# Patient Record
Sex: Male | Born: 1940 | Marital: Married | Smoking: Former smoker
Health system: Southern US, Community
[De-identification: ages and names within clinical notes are randomized; demographics above are authoritative.]

## PROBLEM LIST (undated history)

## (undated) DIAGNOSIS — I10 Essential (primary) hypertension: Secondary | ICD-10-CM

## (undated) DIAGNOSIS — C801 Malignant (primary) neoplasm, unspecified: Secondary | ICD-10-CM

## (undated) DIAGNOSIS — D496 Neoplasm of unspecified behavior of brain: Secondary | ICD-10-CM

## (undated) DIAGNOSIS — E785 Hyperlipidemia, unspecified: Secondary | ICD-10-CM

## (undated) HISTORY — DX: Malignant (primary) neoplasm, unspecified: C80.1

## (undated) HISTORY — DX: Essential (primary) hypertension: I10

## (undated) HISTORY — PX: BRAIN SURGERY: SHX531

## (undated) HISTORY — DX: Hyperlipidemia, unspecified: E78.5

## (undated) HISTORY — PX: APPENDECTOMY: SHX54

## (undated) HISTORY — DX: Neoplasm of unspecified behavior of brain: D49.6

---

## 2007-07-29 ENCOUNTER — Encounter: Payer: Self-pay | Admitting: Emergency Medicine

## 2007-07-29 ENCOUNTER — Inpatient Hospital Stay (HOSPITAL_COMMUNITY): Admission: EM | Admit: 2007-07-29 | Discharge: 2007-08-04 | Payer: Self-pay | Admitting: Emergency Medicine
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
## 2011-01-10 ENCOUNTER — Encounter: Payer: Self-pay | Admitting: Family Medicine

## 2011-01-10 DIAGNOSIS — I10 Essential (primary) hypertension: Secondary | ICD-10-CM | POA: Insufficient documentation

## 2011-03-20 LAB — BASIC METABOLIC PANEL
CO2: 25
CO2: 27
Chloride: 103
Chloride: 107
Chloride: 111
Creatinine, Ser: 0.93
Creatinine, Ser: 1.11
Creatinine, Ser: 1.28
GFR calc Af Amer: >60
GFR calc Af Amer: >60
GFR calc Af Amer: >60
Glucose, Bld: 126 — ABNORMAL HIGH
Potassium: 4.1
Sodium: 141

## 2011-03-20 LAB — DIFFERENTIAL
Basophils Absolute: 0
Basophils Relative: 0
Eosinophils Absolute: 0.1
Monocytes Absolute: 0.7
Monocytes Relative: 7
Neutrophils Relative %: 65

## 2011-03-20 LAB — CBC
Hemoglobin: 14.1
Hemoglobin: 9.3 — ABNORMAL LOW
MCHC: 34.7
MCHC: 35.3
MCV: 91.5
MCV: 91.9
MCV: 93.3
RBC: 2.87 — ABNORMAL LOW
RBC: 3.8 — ABNORMAL LOW
RBC: 4.35
RDW: 14
WBC: 13.4 — ABNORMAL HIGH

## 2011-03-20 LAB — HEPATIC FUNCTION PANEL
Bilirubin, Direct: 0.1
Indirect Bilirubin: 0.8
Total Bilirubin: 0.9

## 2011-03-20 LAB — URINALYSIS, ROUTINE W REFLEX MICROSCOPIC
Leukocytes, UA: NEGATIVE
Nitrite: NEGATIVE
Urobilinogen, UA: 0.2

## 2011-03-20 LAB — TYPE AND SCREEN

## 2011-03-20 LAB — URINE MICROSCOPIC-ADD ON

## 2011-03-21 LAB — CBC
MCHC: 35.1
MCV: 92.9
Platelets: 148 — ABNORMAL LOW
RDW: 14.1
WBC: 10.2

## 2012-10-12 ENCOUNTER — Telehealth: Payer: Self-pay | Admitting: Family Medicine

## 2012-10-12 NOTE — Telephone Encounter (Signed)
Per [facility] can try Bystolic.Marland Kitchengive samples.... Have samples of Bystolic 20mg  and pt can cut in half. Pt to take med for a fews weeks and see if rash goes away and if not will NTBS. Pt aware and agrees and samples left at front desk for pick up!

## 2012-11-05 ENCOUNTER — Ambulatory Visit (INDEPENDENT_AMBULATORY_CARE_PROVIDER_SITE_OTHER): Payer: Medicare Other | Admitting: Family Medicine

## 2012-11-05 ENCOUNTER — Encounter: Payer: Self-pay | Admitting: Family Medicine

## 2012-11-05 VITALS — BP 140/88 | HR 50 | Temp 98.3°F | Resp 18 | Wt <= 1120 oz

## 2012-11-05 DIAGNOSIS — B372 Candidiasis of skin and nail: Secondary | ICD-10-CM | POA: Insufficient documentation

## 2012-11-05 MED ORDER — MOMETASONE FUROATE 0.1 % EX OINT: TOPICAL_OINTMENT | Freq: Every day | CUTANEOUS | Status: DC

## 2012-11-05 MED ORDER — TERBINAFINE HCL 250 MG PO TABS: 250.0000 mg | ORAL_TABLET | Freq: Every day | ORAL | Status: DC

## 2012-11-05 NOTE — Progress Notes (Signed)
  Subjective:    Patient ID: [name], male    DOB: Sep 06, 1940, 72 y.o.   MRN: [id number]  HPI  Patient is here today for followup of his intertrigo. He is seeing dermatology recommended similar therapies to we tried. However the rash continues to return frequently. He is dissatisfied with his current medical regimen. Ketoconazole 2% cream is not controlled the rash. The rash will improve on Diflucan. However usually within a month of completing the Diflucan the rash returns. He has tried to change his clothing 2 weeks left of that area but this does not seem to be helping.  As his blood pressure he is currently on Bystolic 20 mg by mouth daily. His blood pressure is borderline. However he refuses to continue to take this because of cost.  Of note he previously quit his Meryl Crutch because he thought it was causing the rash. Past Medical History  Diagnosis Date  . Brain tumor   . Hypertension    No current outpatient prescriptions on file prior to visit.   No current facility-administered medications on file prior to visit.   Allergies  Allergen Reactions  . Amlodipine     Leg swelling     Review of Systems    review of systems is otherwise negative Objective:   Physical Exam  Constitutional: He appears well-developed and well-nourished.  Cardiovascular: Normal rate, regular rhythm, normal heart sounds and intact distal pulses.  Exam reveals no gallop and no friction rub.   No murmur heard. Pulmonary/Chest: Effort normal and breath sounds normal. No respiratory distress. He has no wheezes. He has no rales.  Abdominal: Soft. Bowel sounds are normal. He exhibits no distension.  Skin: Rash noted. There is erythema.    Bright red intertriginous rash in the folds between his thighs and scrotum on both sides.  There is no scale or evidence of cellulitis. The rash is well-circumscribed with a serpiginous border the      Assessment & Plan:  1. Candidal intertrigo For one month we  will prescribe Lamisil 250 mg by mouth daily to suppress this rash. He is also to use Elocon 0.1% ointment applied sparingly once a day to calm down the itching. In one month if his rash is completely gone and improved, he would then switch to ketoconazole 2%every day and use the Elocon sparingly every 2-3 days for itching. Then in the third month if rash continues to remain controlled he will continue ketoconazole alone as a suppressant. #2 hypertension. Discontinue Bystolic. Resume Losartan 100 mg by mouth daily. - terbinafine (LAMISIL) 250 MG tablet; Take 1 tablet (250 mg total) by mouth daily.  Dispense: 30 tablet; Refill: 0 - mometasone (ELOCON) 0.1 % ointment; Apply topically daily.  Dispense: 45 g; Refill: 1

## 2013-03-16 ENCOUNTER — Other Ambulatory Visit: Payer: Self-pay | Admitting: Family Medicine

## 2013-07-05 ENCOUNTER — Encounter (HOSPITAL_COMMUNITY): Payer: Self-pay | Admitting: Emergency Medicine

## 2013-07-05 ENCOUNTER — Emergency Department (HOSPITAL_COMMUNITY)
Admission: EM | Admit: 2013-07-05 | Discharge: 2013-07-05 | Disposition: A | Discharge status: Home / Self Care | Payer: Medicare Other | Attending: Emergency Medicine | Admitting: Emergency Medicine

## 2013-07-05 DIAGNOSIS — I10 Essential (primary) hypertension: Secondary | ICD-10-CM | POA: Insufficient documentation

## 2013-07-05 DIAGNOSIS — M545 Low back pain, unspecified: Secondary | ICD-10-CM | POA: Insufficient documentation

## 2013-07-05 DIAGNOSIS — G8929 Other chronic pain: Secondary | ICD-10-CM | POA: Insufficient documentation

## 2013-07-05 DIAGNOSIS — Z791 Long term (current) use of non-steroidal anti-inflammatories (NSAID): Secondary | ICD-10-CM | POA: Insufficient documentation

## 2013-07-05 DIAGNOSIS — Z86011 Personal history of benign neoplasm of the brain: Secondary | ICD-10-CM | POA: Insufficient documentation

## 2013-07-05 DIAGNOSIS — M549 Dorsalgia, unspecified: Secondary | ICD-10-CM

## 2013-07-05 DIAGNOSIS — Z79899 Other long term (current) drug therapy: Secondary | ICD-10-CM | POA: Insufficient documentation

## 2013-07-05 DIAGNOSIS — Z87828 Personal history of other (healed) physical injury and trauma: Secondary | ICD-10-CM | POA: Insufficient documentation

## 2013-07-05 MED ORDER — HYDROCODONE-ACETAMINOPHEN 5-325 MG PO TABS: 1.0000 | ORAL_TABLET | Freq: Four times a day (QID) | ORAL | Status: DC | PRN

## 2013-07-05 MED ORDER — METHOCARBAMOL 500 MG PO TABS: 500.0000 mg | ORAL_TABLET | Freq: Two times a day (BID) | ORAL | Status: DC

## 2013-07-05 NOTE — ED Notes (Signed)
Pt is here with left lower and radiated down to left knee.  PT now better.

## 2013-07-05 NOTE — ED Provider Notes (Signed)
Medical screening examination/treatment/procedure(s) were performed by non-physician practitioner and as supervising physician I was immediately available for consultation/collaboration.  Richarda Blade, MD 07/05/13 [phone]
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
## 2013-07-05 NOTE — Discharge Instructions (Signed)
Take pain medication and muscle relaxer as needed for pain.  Do not drive or operate heavy machinery for four hours after taking medication.

## 2013-07-25 ENCOUNTER — Ambulatory Visit (INDEPENDENT_AMBULATORY_CARE_PROVIDER_SITE_OTHER): Payer: Medicare Other | Admitting: Family Medicine

## 2013-07-25 ENCOUNTER — Encounter: Payer: Self-pay | Admitting: Family Medicine

## 2013-07-25 VITALS — BP 140/88 | HR 92 | Temp 97.6°F | Resp 18 | Ht 69.5 in | Wt 243.0 lb

## 2013-07-25 DIAGNOSIS — M5430 Sciatica, unspecified side: Secondary | ICD-10-CM

## 2013-07-25 DIAGNOSIS — C801 Malignant (primary) neoplasm, unspecified: Secondary | ICD-10-CM | POA: Insufficient documentation

## 2013-07-25 DIAGNOSIS — M5432 Sciatica, left side: Secondary | ICD-10-CM

## 2013-07-25 DIAGNOSIS — C61 Malignant neoplasm of prostate: Secondary | ICD-10-CM

## 2013-07-25 LAB — PSA, MEDICARE: PSA: 4 ng/mL (ref <=4.00)

## 2013-07-25 MED ORDER — PREDNISONE 20 MG PO TABS: ORAL_TABLET | ORAL | Status: DC

## 2013-07-25 NOTE — Progress Notes (Signed)
   Subjective:    Patient ID: [name], male    DOB: June 17, 1941, 73 y.o.   MRN: [id number]  HPI Patient injured his back 4 weeks ago lifting a heavy object.  He reports low back pain radiating into his left hip and numbness on his left thigh.  He also has severe left hip pain due to longstanding osteoarthritis.  He also has a history of prostate cancer s/p cryotherapy at [facility] over a year ago.  No one has checked a PSA since. Past Medical History  Diagnosis Date  . Brain tumor   . Hypertension   . Cancer     prostate s/p cryotherpay at [facility]   Current Outpatient Prescriptions on File Prior to Visit  Medication Sig Dispense Refill  . losartan (COZAAR) 100 MG tablet Take 100 mg by mouth daily.      . meloxicam (MOBIC) 15 MG tablet Take 15 mg by mouth daily.       No current facility-administered medications on file prior to visit.   Allergies  Allergen Reactions  . Amlodipine     Leg swelling   History   Social History  . Marital Status: Married    Spouse Name: N/A    Number of Children: N/A  . Years of Education: N/A   Occupational History  . Not on file.   Social History Main Topics  . Smoking status: Never Smoker   . Smokeless tobacco: Not on file  . Alcohol Use: No  . Drug Use: No  . Sexual Activity: Not on file   Other Topics Concern  . Not on file   Social History Narrative  . No narrative on file      Review of Systems  All other systems reviewed and are negative.       Objective:   Physical Exam  Vitals reviewed. Cardiovascular: Normal rate and regular rhythm.   No murmur heard. Pulmonary/Chest: Effort normal and breath sounds normal. No respiratory distress.  Abdominal: Soft. Bowel sounds are normal.  Musculoskeletal:       Lumbar back: He exhibits decreased range of motion, tenderness and pain. He exhibits no bony tenderness.   Patient walks with an antalgic gait due to sever left hip pain.       Assessment & Plan:  1. Left  sided sciatica Recheck in 2 weeks, MRI if no better.  He needs to follow up with his ortho regarding the sever left hip pain.  Maybe time for replacement. - predniSONE (DELTASONE) 20 MG tablet; 3 tabs poqday 1-2,2 poqday 3-4, 1 poqday 5-6  Dispense: 12 tablet; Refill: 0  2. Prostate cancer - PSA, Medicare

## 2013-07-26 ENCOUNTER — Encounter: Payer: Self-pay | Admitting: Family Medicine

## 2013-07-29 ENCOUNTER — Telehealth: Payer: Self-pay | Admitting: Family Medicine

## 2013-08-26 ENCOUNTER — Telehealth: Payer: Self-pay | Admitting: Family Medicine

## 2013-08-26 MED ORDER — TRIAMCINOLONE ACETONIDE 0.1 % EX CREA: 1.0000 "application " | TOPICAL_CREAM | Freq: Two times a day (BID) | CUTANEOUS | Status: DC

## 2013-08-26 NOTE — Telephone Encounter (Signed)
Message copied by Alyson Locket on Fri Aug 26, 2013  4:54 PM ------      Message from: [facility], Charlynne Cousins      Created: Fri Aug 26, 2013  4:05 PM       He needs Triamcinilone CReam 0.1%-80 gram tube called in to      [facility] ------

## 2013-08-26 NOTE — Telephone Encounter (Signed)
Rx Refilled  

## 2013-09-12 ENCOUNTER — Other Ambulatory Visit: Payer: Self-pay | Admitting: Family Medicine

## 2014-01-11 ENCOUNTER — Other Ambulatory Visit: Payer: Self-pay | Admitting: Family Medicine

## 2014-01-11 ENCOUNTER — Ambulatory Visit (INDEPENDENT_AMBULATORY_CARE_PROVIDER_SITE_OTHER): Payer: Medicare Other | Admitting: Family Medicine

## 2014-01-11 ENCOUNTER — Other Ambulatory Visit: Payer: Self-pay

## 2014-01-11 ENCOUNTER — Encounter: Payer: Self-pay | Admitting: Family Medicine

## 2014-01-11 ENCOUNTER — Encounter: Payer: Self-pay | Admitting: *Deleted

## 2014-01-11 ENCOUNTER — Other Ambulatory Visit: Payer: Medicare Other

## 2014-01-11 VITALS — BP 142/80 | HR 82 | Temp 98.4°F | Resp 16 | Ht 69.0 in | Wt 234.0 lb

## 2014-01-11 DIAGNOSIS — C61 Malignant neoplasm of prostate: Secondary | ICD-10-CM

## 2014-01-11 DIAGNOSIS — I10 Essential (primary) hypertension: Secondary | ICD-10-CM

## 2014-01-11 DIAGNOSIS — L259 Unspecified contact dermatitis, unspecified cause: Secondary | ICD-10-CM

## 2014-01-11 DIAGNOSIS — Z79899 Other long term (current) drug therapy: Secondary | ICD-10-CM

## 2014-01-11 MED ORDER — METHYLPREDNISOLONE (PAK) 4 MG PO TABS: ORAL_TABLET | ORAL | Status: DC

## 2014-01-11 NOTE — Patient Instructions (Addendum)
Take the prednisone pak as prescribed  You can take benadryl anti-histamine  Call dermatology if not better F/U as previous with Dr. Dennard Schaumann

## 2014-01-11 NOTE — Progress Notes (Signed)
Patient ID: [name], male   DOB: May 16, 1941, 73 y.o.   MRN: [id number]   Subjective:    Patient ID: [name], male    DOB: [birth year], 73 y.o.   MRN: [id number]  Patient presents for Rash  patient here with a rash across his chest. He was actually out in the yard doing a lot of yard work and moving a lot of bushes over the weekend he often gets bites in things on his legs however when he came home he noticed some redness and itching on his chest. He does note that he was scratching it on Monday he notices severe red rash at some blistering across his chest. It is very sore but mostly very paretic. He has tried using his topical creams given to him by dermatology as he is very sensitive scan as well as eczema but this did not help. He also took some type of antihistamine but he does not know the name of it but it did not help. He's not had any drainage from the rash is not any fever or shortness of breath chest pain.    Review Of Systems:  GEN- denies fatigue, fever, weight loss,weakness, recent illness HEENT- denies eye drainage, change in vision, nasal discharge, CVS- denies chest pain, palpitations RESP- denies SOB, cough, wheeze ABD- denies N/V, change in stools, abd pain GU- denies dysuria, hematuria, dribbling, incontinence MSK- denies joint pain, muscle aches, injury Neuro- denies headache, dizziness, syncope, seizure activity       Objective:    BP 142/80  Pulse 82  Temp(Src) 98.4 F (36.9 C) (Oral)  Resp 16  Ht 5\' 9"  (1.753 m)  Wt 234 lb (106.142 kg)  BMI 34.54 kg/m2 GEN- NAD, alert and oriented x3 Skin- erthematous blistering rash across chest- few maculopapular lesions  Legs scattered erythematous maculopapular lesions, bug bites        Assessment & Plan:      Problem List Items Addressed This Visit   None    Visit Diagnoses   Contact dermatitis    -  Primary    Differentials include contact dermatitis with poison sumac/oak vs sun poisoning based  on the blistering , will start medrol dosepak, benadryl, he has dermatology if no improvement       Note: This dictation was prepared with Dragon dictation along with smaller phrase technology. Any transcriptional errors that result from this process are unintentional.

## 2014-01-12 LAB — CBC WITH DIFFERENTIAL/PLATELET
Basophils Absolute: 0.1 10*3/uL (ref 0.0–0.1)
Basophils Relative: 1 % (ref 0–1)
EOS ABS: 0.1 10*3/uL (ref 0.0–0.7)
Eosinophils Relative: 1 % (ref 0–5)
HCT: 40.7 % (ref 39.0–52.0)
Hemoglobin: 14.2 g/dL (ref 13.0–17.0)
LYMPHS ABS: 1.7 10*3/uL (ref 0.7–4.0)
Lymphocytes Relative: 21 % (ref 12–46)
MCH: 31.6 pg (ref 26.0–34.0)
MCHC: 34.9 g/dL (ref 30.0–36.0)
MCV: 90.4 fL (ref 78.0–100.0)
Monocytes Absolute: 0.6 10*3/uL (ref 0.1–1.0)
Monocytes Relative: 7 % (ref 3–12)
NEUTROS ABS: 5.5 10*3/uL (ref 1.7–7.7)
NEUTROS PCT: 70 % (ref 43–77)
PLATELETS: 274 10*3/uL (ref 150–400)
RBC: 4.5 MIL/uL (ref 4.22–5.81)
RDW: 14.4 % (ref 11.5–15.5)
WBC: 7.9 10*3/uL (ref 4.0–10.5)

## 2014-01-12 LAB — LIPID PANEL
CHOL/HDL RATIO: 4.7 ratio
Cholesterol: 229 mg/dL — ABNORMAL HIGH (ref 0–200)
HDL: 49 mg/dL (ref >39)
LDL Cholesterol: 153 mg/dL — ABNORMAL HIGH (ref 0–99)
Triglycerides: 136 mg/dL (ref <150)
VLDL: 27 mg/dL (ref 0–40)

## 2014-01-12 LAB — COMPREHENSIVE METABOLIC PANEL
ALBUMIN: 4.5 g/dL (ref 3.5–5.2)
ALT: 19 U/L (ref 0–53)
AST: 21 U/L (ref 0–37)
Alkaline Phosphatase: 79 U/L (ref 39–117)
BUN: 18 mg/dL (ref 6–23)
CALCIUM: 9.4 mg/dL (ref 8.4–10.5)
CHLORIDE: 102 meq/L (ref 96–112)
CO2: 25 meq/L (ref 19–32)
Creat: 1.04 mg/dL (ref 0.50–1.35)
GLUCOSE: 83 mg/dL (ref 70–99)
POTASSIUM: 4.4 meq/L (ref 3.5–5.3)
Sodium: 137 mEq/L (ref 135–145)
Total Bilirubin: 0.8 mg/dL (ref 0.2–1.2)
Total Protein: 7 g/dL (ref 6.0–8.3)

## 2014-01-12 LAB — PSA, MEDICARE: PSA: 3.1 ng/mL (ref <=4.00)

## 2014-01-23 ENCOUNTER — Encounter: Payer: Self-pay | Admitting: Family Medicine

## 2014-01-23 ENCOUNTER — Ambulatory Visit (INDEPENDENT_AMBULATORY_CARE_PROVIDER_SITE_OTHER): Payer: Medicare Other | Admitting: Family Medicine

## 2014-01-23 VITALS — BP 152/74 | HR 60 | Temp 97.1°F | Resp 18 | Ht 69.5 in | Wt 234.0 lb

## 2014-01-23 DIAGNOSIS — I10 Essential (primary) hypertension: Secondary | ICD-10-CM

## 2014-01-23 DIAGNOSIS — E785 Hyperlipidemia, unspecified: Secondary | ICD-10-CM

## 2014-01-23 NOTE — Progress Notes (Signed)
Subjective:    Patient ID: [name], male    DOB: [birth year], 73 y.o.   MRN: [id number]  HPI Patient today for followup of his hypertension. He is not taking losartan 100 mg by mouth daily regularly.In fact, he misses the medication half of the time.  He denies any chest pain shortness of breath or dyspnea on exertion. His most recent lab work is listed below: Appointment on 01/11/2014  Component Date Value Ref Range Status  . Sodium 01/11/2014 137  135 - 145 mEq/L Final  . Potassium 01/11/2014 4.4  3.5 - 5.3 mEq/L Final  . Chloride 01/11/2014 102  96 - 112 mEq/L Final  . CO2 01/11/2014 25  19 - 32 mEq/L Final  . Glucose, Bld 01/11/2014 83  70 - 99 mg/dL Final  . BUN 01/11/2014 18  6 - 23 mg/dL Final  . Creat 01/11/2014 1.04  0.50 - 1.35 mg/dL Final  . Total Bilirubin 01/11/2014 0.8  0.2 - 1.2 mg/dL Final  . Alkaline Phosphatase 01/11/2014 79  39 - 117 U/L Final  . AST 01/11/2014 21  0 - 37 U/L Final  . ALT 01/11/2014 19  0 - 53 U/L Final  . Total Protein 01/11/2014 7.0  6.0 - 8.3 g/dL Final  . Albumin 01/11/2014 4.5  3.5 - 5.2 g/dL Final  . Calcium 01/11/2014 9.4  8.4 - 10.5 mg/dL Final  . PSA 01/11/2014 3.10  <=4.00 ng/mL Final   Comment: Test Methodology: ECLIA PSA (Electrochemiluminescence Immunoassay)                                                     For PSA values from 2.5-4.0, particularly in younger men <60 years                          old, the AUA and NCCN suggest testing for % Free PSA (3515) and                          evaluation of the rate of increase in PSA (PSA velocity).  . WBC 01/11/2014 7.9  4.0 - 10.5 K/uL Final  . RBC 01/11/2014 4.50  4.22 - 5.81 MIL/uL Final  . Hemoglobin 01/11/2014 14.2  13.0 - 17.0 g/dL Final  . HCT 01/11/2014 40.7  39.0 - 52.0 % Final  . MCV 01/11/2014 90.4  78.0 - 100.0 fL Final  . MCH 01/11/2014 31.6  26.0 - 34.0 pg Final  . MCHC 01/11/2014 34.9  30.0 - 36.0 g/dL Final  . RDW 01/11/2014 14.4  11.5 - 15.5 % Final  . Platelets  01/11/2014 274  150 - 400 K/uL Final  . Neutrophils Relative % 01/11/2014 70  43 - 77 % Final  . Neutro Abs 01/11/2014 5.5  1.7 - 7.7 K/uL Final  . Lymphocytes Relative 01/11/2014 21  12 - 46 % Final  . Lymphs Abs 01/11/2014 1.7  0.7 - 4.0 K/uL Final  . Monocytes Relative 01/11/2014 7  3 - 12 % Final  . Monocytes Absolute 01/11/2014 0.6  0.1 - 1.0 K/uL Final  . Eosinophils Relative 01/11/2014 1  0 - 5 % Final  . Eosinophils Absolute 01/11/2014 0.1  0.0 - 0.7 K/uL Final  . Basophils Relative 01/11/2014 1  0 - 1 %  Final  . Basophils Absolute 01/11/2014 0.1  0.0 - 0.1 K/uL Final  . Smear Review 01/11/2014 Criteria for review not met   Final  . Cholesterol 01/11/2014 229* 0 - 200 mg/dL Final   Comment: ATP III Classification:                                < 200        mg/dL        Desirable                               200 - 239     mg/dL        Borderline High                               >= 240        mg/dL        High                             . Triglycerides 01/11/2014 136  <150 mg/dL Final  . HDL 01/11/2014 49  >39 mg/dL Final  . Total CHOL/HDL Ratio 01/11/2014 4.7   Final  . VLDL 01/11/2014 27  0 - 40 mg/dL Final  . LDL Cholesterol 01/11/2014 153* 0 - 99 mg/dL Final   Comment:                            Total Cholesterol/HDL Ratio:CHD Risk                                                 Coronary Heart Disease Risk Table                                                                 Men       Women                                   1/2 Average Risk              3.4        3.3                                       Average Risk              5.0        4.4                                    2X Average Risk              9.6  7.1                                    3X Average Risk             23.4       11.0                          Use the calculated Patient Ratio above and the CHD Risk table                           to determine the patient's CHD Risk.                           ATP III Classification (LDL):                                < 100        mg/dL         Optimal                               100 - 129     mg/dL         Near or Above Optimal                               130 - 159     mg/dL         Borderline High                               160 - 189     mg/dL         High                                > 190        mg/dL         Very High                              Past Medical History  Diagnosis Date  . Brain tumor   . Hypertension   . 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 losartan (COZAAR) 100 MG tablet TAKE ONE TABLET BY MOUTH ONCE DAILY  30 tablet  11  . meloxicam (MOBIC) 15 MG tablet Take 15 mg by mouth daily.       No current facility-administered medications on file prior to visit.   Allergies  Allergen Reactions  . Amlodipine     Leg swelling   History   Social History  . Marital Status: Married    Spouse Name: N/A    Number of Children: N/A  . Years of Education: N/A   Occupational History  . Not on file.   Social History Main Topics  . Smoking status: Never Smoker   . Smokeless tobacco:  Not on file  . Alcohol Use: No  . Drug Use: No  . Sexual Activity: Not on file   Other Topics Concern  . Not on file   Social History Narrative  . No narrative on file      Review of Systems  All other systems reviewed and are negative.      Objective:   Physical Exam  Vitals reviewed. Constitutional: He appears well-developed and well-nourished.  Cardiovascular: Normal rate, regular rhythm, normal heart sounds and intact distal pulses.   Pulmonary/Chest: Effort normal and breath sounds normal. No respiratory distress. He has no wheezes. He has no rales.  Abdominal: Soft. Bowel sounds are normal. He exhibits no distension. There is no tenderness. There is no rebound and no  guarding.  Musculoskeletal: He exhibits no edema.          Assessment & Plan:  1. Essential hypertension His blood pressure is elevated. I have recommended that he take losartan 100 mg by mouth daily regularly and recheck blood pressure in 2 weeks. If still elevated, will start the patient on hydrochlorothiazide.  2. HLD (hyperlipidemia) Patient's goal LDL cholesterol is less than 130. I gave the patient option of starting Lipitor versus diet exercise and weight loss. Patient lacks therapeutic lifestyle changes. I gave  the patient a handout on a low saturated fat diet. He will try making therapeutic lifestyle changes and recheck his fasting lipid panel in 3 months. If still greater than 130, will start patient on Lipitor.

## 2014-02-06 ENCOUNTER — Ambulatory Visit: Payer: Medicare Other | Admitting: Family Medicine

## 2014-04-24 ENCOUNTER — Ambulatory Visit: Payer: Medicare Other | Admitting: Family Medicine

## 2014-11-03 ENCOUNTER — Other Ambulatory Visit: Payer: Self-pay | Admitting: Family Medicine

## 2014-12-01 ENCOUNTER — Other Ambulatory Visit: Payer: Self-pay | Admitting: Family Medicine

## 2015-03-16 ENCOUNTER — Encounter: Payer: Self-pay | Admitting: Family Medicine

## 2015-03-16 ENCOUNTER — Other Ambulatory Visit: Payer: Self-pay | Admitting: Family Medicine

## 2015-03-16 NOTE — Telephone Encounter (Signed)
Pt has not been seen in over one year. Medication refill for one time only.  Patient needs to be seen.  Letter sent for patient to call and schedule 

## 2015-04-16 ENCOUNTER — Ambulatory Visit (INDEPENDENT_AMBULATORY_CARE_PROVIDER_SITE_OTHER): Payer: Medicare Other | Admitting: Physician Assistant

## 2015-04-16 ENCOUNTER — Encounter: Payer: Self-pay | Admitting: Physician Assistant

## 2015-04-16 VITALS — BP 150/94 | HR 60 | Temp 98.1°F | Resp 18 | Wt 239.0 lb

## 2015-04-16 DIAGNOSIS — Z23 Encounter for immunization: Secondary | ICD-10-CM

## 2015-04-16 DIAGNOSIS — E785 Hyperlipidemia, unspecified: Secondary | ICD-10-CM

## 2015-04-16 DIAGNOSIS — Z125 Encounter for screening for malignant neoplasm of prostate: Secondary | ICD-10-CM

## 2015-04-16 DIAGNOSIS — I10 Essential (primary) hypertension: Secondary | ICD-10-CM | POA: Diagnosis not present

## 2015-04-16 LAB — COMPLETE METABOLIC PANEL WITH GFR
ALT: 21 U/L (ref 9–46)
AST: 24 U/L (ref 10–35)
Albumin: 4.3 g/dL (ref 3.6–5.1)
Alkaline Phosphatase: 83 U/L (ref 40–115)
BUN: 16 mg/dL (ref 7–25)
CHLORIDE: 104 mmol/L (ref 98–110)
CO2: 26 mmol/L (ref 20–31)
Calcium: 9.2 mg/dL (ref 8.6–10.3)
Creat: 1.01 mg/dL (ref 0.70–1.18)
GFR, EST AFRICAN AMERICAN: 85 mL/min (ref >=60)
GFR, EST NON AFRICAN AMERICAN: 73 mL/min (ref >=60)
Glucose, Bld: 94 mg/dL (ref 70–99)
POTASSIUM: 4.8 mmol/L (ref 3.5–5.3)
SODIUM: 140 mmol/L (ref 135–146)
TOTAL PROTEIN: 7.3 g/dL (ref 6.1–8.1)
Total Bilirubin: 0.9 mg/dL (ref 0.2–1.2)

## 2015-04-16 LAB — LIPID PANEL
CHOL/HDL RATIO: 4.3 ratio (ref <=5.0)
Cholesterol: 213 mg/dL — ABNORMAL HIGH (ref 125–200)
HDL: 50 mg/dL (ref >=40)
LDL CALC: 138 mg/dL — AB (ref <130)
TRIGLYCERIDES: 126 mg/dL (ref <150)
VLDL: 25 mg/dL (ref <30)

## 2015-04-16 MED ORDER — LOSARTAN POTASSIUM 100 MG PO TABS: 100.0000 mg | ORAL_TABLET | Freq: Every day | ORAL | Status: DC

## 2015-04-16 MED ORDER — NEBIVOLOL HCL 5 MG PO TABS: 5.0000 mg | ORAL_TABLET | Freq: Every day | ORAL | Status: DC

## 2015-04-16 MED ORDER — TRIAMCINOLONE ACETONIDE 0.1 % EX CREA: TOPICAL_CREAM | CUTANEOUS | Status: DC
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
## 2015-04-17 LAB — PSA, MEDICARE: PSA: 4.46 ng/mL — ABNORMAL HIGH (ref <=4.00)

## 2015-04-19 ENCOUNTER — Encounter: Payer: Self-pay | Admitting: Family Medicine

## 2015-04-27 ENCOUNTER — Other Ambulatory Visit: Payer: Self-pay | Admitting: Family Medicine

## 2015-04-27 ENCOUNTER — Encounter: Payer: Self-pay | Admitting: Family Medicine

## 2015-04-27 ENCOUNTER — Ambulatory Visit (INDEPENDENT_AMBULATORY_CARE_PROVIDER_SITE_OTHER): Payer: Medicare Other | Admitting: Family Medicine

## 2015-04-27 VITALS — BP 140/72 | HR 68 | Temp 98.2°F | Resp 14 | Ht 69.5 in | Wt 241.0 lb

## 2015-04-27 DIAGNOSIS — R972 Elevated prostate specific antigen [PSA]: Secondary | ICD-10-CM

## 2015-04-27 DIAGNOSIS — L259 Unspecified contact dermatitis, unspecified cause: Secondary | ICD-10-CM | POA: Diagnosis not present

## 2015-04-27 DIAGNOSIS — I10 Essential (primary) hypertension: Secondary | ICD-10-CM | POA: Diagnosis not present

## 2015-04-27 DIAGNOSIS — Z8546 Personal history of malignant neoplasm of prostate: Secondary | ICD-10-CM

## 2015-04-27 MED ORDER — NEBIVOLOL HCL 10 MG PO TABS: 10.0000 mg | ORAL_TABLET | Freq: Every day | ORAL | Status: DC

## 2015-04-27 MED ORDER — METHYLPREDNISOLONE ACETATE 40 MG/ML IJ SUSP
40.0000 mg | Freq: Once | INTRAMUSCULAR | Status: AC
  Administered 2015-04-27: 40 mg via INTRAMUSCULAR

## 2015-04-27 MED ORDER — METHYLPREDNISOLONE 4 MG PO TBPK: ORAL_TABLET | ORAL | Status: DC

## 2015-04-27 MED ORDER — NEBIVOLOL HCL 5 MG PO TABS: 5.0000 mg | ORAL_TABLET | Freq: Every day | ORAL | Status: DC

## 2015-04-27 NOTE — Progress Notes (Signed)
   Subjective:    Patient ID: [name], male    DOB: 1941/06/18, 74 y.o.   MRN: [id number]  Patient presents for Skin Irritation  patient here rash in the bilateral lower extremities since Tuesday. He states that he [place] and he pulled some chiggers off of him  and since then he's been scratching a lot and has irritated his skin. He has been taking Benadryl is not using any topicals.No fever, no joint pain, feels well.   HTN- Recently had bystolic added, BP improved, no side effects, also on losartan   Review Of Systems:  GEN- denies fatigue, fever, weight loss,weakness, recent illness HEENT- denies eye drainage, change in vision, nasal discharge, CVS- denies chest pain, palpitations RESP- denies SOB, cough, wheeze ABD- denies N/V, change in stools, abd pain GU- denies dysuria, hematuria, dribbling, incontinence MSK- denies joint pain, muscle aches, injury Neuro- denies headache, dizziness, syncope, seizure activity       Objective:    BP 140/72 mmHg  Pulse 68  Temp(Src) 98.2 F (36.8 C) (Oral)  Resp 14  Ht 5' 9.5" (1.765 m)  Wt 241 lb (109.317 kg)  BMI 35.09 kg/m2 GEN- NAD, alert and oriented x3 Skin- bilat lower ext- erythematous plaque patches,mild blanching, small vesicles coalesced , patches 6x5cm wide around lower legs, + excoriations, few scattered erythematous lesions on left foot/ankle, skin otherwise in tact       Assessment & Plan:      Problem List Items Addressed This Visit    Hypertension - Primary    Improved, given info for patient assistance for his meds, he does not have Part D, but does not want to change to cheaper Beta Blocker Given copay cards, not sure if it will work as he is uninsured with regards to part D      Relevant Medications   nebivolol (BYSTOLIC) 10 MG tablet    Other Visit Diagnoses    Contact dermatitis        Based on history treat for contact dermatitis, as very large area, Depo Medrol IM, prednisone  pack starts tomorrow, benadryl as needed, no sign of cellulitis    Relevant Medications    methylPREDNISolone acetate (DEPO-MEDROL) injection 40 mg (Completed)       Note: This dictation was prepared with Dragon dictation along with smaller phrase technology. Any transcriptional errors that result from this process are unintentional.

## 2015-04-27 NOTE — Patient Instructions (Signed)
Take prednisone starting tomorrow Continue blood pressure medications F/U as needed or in 6 months for blood pressure

## 2015-04-27 NOTE — Assessment & Plan Note (Signed)
Improved, given info for patient assistance for his meds, he does not have Part D, but does not want to change to cheaper Beta Blocker Given copay cards, not sure if it will work as he is uninsured with regards to part D

## 2015-05-01 ENCOUNTER — Telehealth: Payer: Self-pay | Admitting: *Deleted

## 2015-05-01 NOTE — Telephone Encounter (Signed)
Pt has appointment scheduled for Friday Nov 4 at [place] at [facility] with Dr. Morrison Old, Left message on vm for pt to return my call.

## 2015-05-02 NOTE — Telephone Encounter (Signed)
Left message to return call to pt for appt information

## 2015-05-08 ENCOUNTER — Ambulatory Visit: Payer: Self-pay | Admitting: Family Medicine

## 2015-05-08 ENCOUNTER — Ambulatory Visit (INDEPENDENT_AMBULATORY_CARE_PROVIDER_SITE_OTHER): Payer: Medicare Other | Admitting: Family Medicine

## 2015-05-08 ENCOUNTER — Encounter: Payer: Self-pay | Admitting: Family Medicine

## 2015-05-08 VITALS — BP 130/84 | HR 68 | Temp 98.0°F | Resp 18 | Wt 240.0 lb

## 2015-05-08 DIAGNOSIS — L03311 Cellulitis of abdominal wall: Secondary | ICD-10-CM | POA: Diagnosis not present

## 2015-05-08 MED ORDER — CEPHALEXIN 500 MG PO CAPS: 500.0000 mg | ORAL_CAPSULE | Freq: Three times a day (TID) | ORAL | Status: DC

## 2015-05-08 MED ORDER — MOMETASONE FUROATE 0.1 % EX CREA: 1.0000 "application " | TOPICAL_CREAM | Freq: Every day | CUTANEOUS | Status: DC

## 2015-05-08 NOTE — Progress Notes (Signed)
   Subjective:    Patient ID: [name], male    DOB: 11-30-1940, 74 y.o.   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 Recently he was scratching his groin area vigorously. He then subsequently began scratching his right flank near mosquito bite. He now has a bright red erythematous patch of skin approximately 10 cm x 8 cm which is warm to the touch consistent with cellulitis. He also has a similar patch on the lateral right thigh where he has been scratching vigorously. Both areas have been there for approximately 3 or 4 days. They're both warm and tender Past Medical History  Diagnosis Date  . Brain tumor ([place])   . Hypertension   . 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 Brain surgery    . Appendectomy     Current Outpatient Prescriptions on File Prior to Visit  Medication Sig Dispense Refill  . losartan (COZAAR) 100 MG tablet Take 1 tablet (100 mg total) by mouth daily. 90 tablet 2  . nebivolol (BYSTOLIC) 10 MG tablet Take 1 tablet (10 mg total) by mouth daily. 30 tablet 3   No current facility-administered medications on file prior to visit.   Allergies  Allergen Reactions  . Amlodipine     Leg swelling   Social History   Social History  . Marital Status: Married    Spouse Name: N/A  . Number of Children: N/A  . Years of Education: N/A   Occupational History  . Not on file.   Social History Main Topics  . Smoking status: Former Research scientist (life sciences)  . Smokeless tobacco: Never Used  . Alcohol Use: No  . Drug Use: No  . Sexual Activity: Not Currently   Other Topics Concern  . Not on file   Social History Narrative      Review of Systems  All other systems reviewed and are negative.      Objective:   Physical Exam  Cardiovascular: Normal rate, regular rhythm and normal heart sounds.     Pulmonary/Chest: Effort normal and breath sounds normal.  Skin: Skin is warm. Rash noted. There is erythema.          Assessment & Plan:  Cellulitis of right abdominal wall - Plan: cephALEXin (KEFLEX) 500 MG capsule, mometasone (ELOCON) 0.1 % cream   I believe the patient has developed a secondary cellulitis on his right abdominal wall and right lateral 5 after scratching his groin  Area. I will treat this with Keflex 500 mg by mouth 3 times a day for 7 days. He can use Elocon ointment for itching. Recheck in one week if no better or sooner if worse

## 2015-10-08 ENCOUNTER — Ambulatory Visit (INDEPENDENT_AMBULATORY_CARE_PROVIDER_SITE_OTHER): Payer: Medicare Other | Admitting: Family Medicine

## 2015-10-08 ENCOUNTER — Ambulatory Visit: Payer: Medicare Other | Admitting: Physician Assistant

## 2015-10-08 ENCOUNTER — Encounter: Payer: Self-pay | Admitting: Family Medicine

## 2015-10-08 VITALS — BP 176/82 | HR 64 | Temp 98.2°F | Resp 16 | Ht 69.5 in | Wt 249.0 lb

## 2015-10-08 DIAGNOSIS — I10 Essential (primary) hypertension: Secondary | ICD-10-CM

## 2015-10-08 DIAGNOSIS — B372 Candidiasis of skin and nail: Secondary | ICD-10-CM

## 2015-10-08 DIAGNOSIS — Z8546 Personal history of malignant neoplasm of prostate: Secondary | ICD-10-CM

## 2015-10-08 MED ORDER — ATENOLOL 50 MG PO TABS: 50.0000 mg | ORAL_TABLET | Freq: Every day | ORAL | Status: DC

## 2015-10-08 MED ORDER — HYDROCHLOROTHIAZIDE 25 MG PO TABS: 25.0000 mg | ORAL_TABLET | Freq: Every day | ORAL | Status: DC

## 2015-10-08 MED ORDER — CLOTRIMAZOLE-BETAMETHASONE 1-0.05 % EX CREA: 1.0000 "application " | TOPICAL_CREAM | Freq: Two times a day (BID) | CUTANEOUS | Status: DC

## 2015-10-08 NOTE — Progress Notes (Signed)
Subjective:    Patient ID: [name], male    DOB: 22-Jan-1941, 75 y.o.   MRN: [id number]  HPI  I have not seen this patient in quite a while. He is here today for a checkup. His blood pressure is extremely high 176/82. He is currently taking losartan and bystolic 5 mg a day. This medication is very expensive and I do not believe he will be able to afford this moving forward. He also states that he is under tremendous stress. 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 Unfortunately, his son is also battling heroin addiction. I will see this is causing tremendous anxiety for this patient as he is trying to help his son bowel through this. He believes this is raising his blood pressure in part. He also has a history of prostate cancer status post cryotherapy. His PSA was checked at this office in October and was found to be elevated. It was 3 previously. It had risen to greater than 4.  He did not follow-up with his urologist at [facility] as recommended. Otherwise he is doing well. Past Medical History  Diagnosis Date  . Brain tumor ([place])   . Hypertension   . 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 Brain surgery    . Appendectomy     Current Outpatient Prescriptions on File Prior to Visit  Medication Sig Dispense Refill  . losartan (COZAAR) 100 MG tablet Take 1 tablet (100 mg total) by mouth daily. 90 tablet 2  . mometasone (ELOCON) 0.1 % cream Apply 1 application topically daily. 45 g 0  . nebivolol (BYSTOLIC) 10 MG tablet Take 1 tablet (10 mg total) by mouth daily. 30 tablet 3   No current facility-administered medications on file prior to visit.   Allergies  Allergen Reactions  . Amlodipine     Leg swelling   Social History   Social History  . Marital Status: Married    Spouse Name: N/A  . Number of Children: N/A  . Years of Education:  N/A   Occupational History  . Not on file.   Social History Main Topics  . Smoking status: Former Research scientist (life sciences)  . Smokeless tobacco: Never Used  . Alcohol Use: No  . Drug Use: No  . Sexual Activity: Not Currently   Other Topics Concern  . Not on file   Social History Narrative     Review of Systems  All other systems reviewed and are negative.      Objective:   Physical Exam  Constitutional: He appears well-developed and well-nourished.  Cardiovascular: Normal rate, regular rhythm, normal heart sounds and intact distal pulses.  Exam reveals no gallop and no friction rub.   No murmur heard. Pulmonary/Chest: Effort normal and breath sounds normal. No respiratory distress. He has no wheezes. He has no rales.  Abdominal: Soft. Bowel sounds are normal. He exhibits no distension. There is no tenderness. There is no rebound.  Musculoskeletal: He exhibits no edema.  Vitals reviewed.         Assessment & Plan:  Benign essential HTN - Plan: atenolol (TENORMIN) 50 MG tablet, hydrochlorothiazide (HYDRODIURIL) 25 MG tablet, CBC with Differential/Platelet, COMPLETE METABOLIC PANEL WITH GFR, Lipid panel  Candidal intertrigo - Plan: clotrimazole-betamethasone (LOTRISONE) cream  History of prostate cancer - Plan: PSA  Discontineu bystolic  and replace with atenolol 50 mg poqday for cost.  Continue losartan 100 mg by mouth daily. Add hydrochlorothiazide 25 mg by mouth daily and recheck in one month. I will check a CBC, CMP, fasting lipid panel. I will also check a PSA. His PSA continues to rise, he will definitely need to follow with his urologist at [facility]. The patient also has Candida intertrigo underneath the fold of his stomach at his waistline as well as in his left groin.  I will treat this with Lotrisone cream twice daily for 10 days.

## 2015-10-09 LAB — CBC WITH DIFFERENTIAL/PLATELET
BASOS ABS: 63 {cells}/uL (ref 0–200)
Basophils Relative: 1 %
EOS PCT: 3 %
Eosinophils Absolute: 189 cells/uL (ref 15–500)
HCT: 42.6 % (ref 38.5–50.0)
Hemoglobin: 14.4 g/dL (ref 13.0–17.0)
Lymphocytes Relative: 25 %
Lymphs Abs: 1575 cells/uL (ref 850–3900)
MCH: 32.3 pg (ref 27.0–33.0)
MCHC: 33.8 g/dL (ref 32.0–36.0)
MCV: 95.5 fL (ref 80.0–100.0)
MONOS PCT: 12 %
MPV: 10 fL (ref 7.5–12.5)
Monocytes Absolute: 756 cells/uL (ref 200–950)
NEUTROS ABS: 3717 {cells}/uL (ref 1500–7800)
Neutrophils Relative %: 59 %
PLATELETS: 242 10*3/uL (ref 140–400)
RBC: 4.46 MIL/uL (ref 4.20–5.80)
RDW: 14.4 % (ref 11.0–15.0)
WBC: 6.3 10*3/uL (ref 3.8–10.8)

## 2015-10-09 LAB — LIPID PANEL
CHOL/HDL RATIO: 4.7 ratio (ref <=5.0)
Cholesterol: 199 mg/dL (ref 125–200)
HDL: 42 mg/dL (ref >=40)
LDL Cholesterol: 125 mg/dL (ref <130)
Triglycerides: 160 mg/dL — ABNORMAL HIGH (ref <150)
VLDL: 32 mg/dL — AB (ref <30)

## 2015-10-09 LAB — COMPLETE METABOLIC PANEL WITH GFR
ALT: 25 U/L (ref 9–46)
AST: 24 U/L (ref 10–35)
Albumin: 4.2 g/dL (ref 3.6–5.1)
Alkaline Phosphatase: 67 U/L (ref 40–115)
BUN: 22 mg/dL (ref 7–25)
CO2: 25 mmol/L (ref 20–31)
Calcium: 8.9 mg/dL (ref 8.6–10.3)
Chloride: 104 mmol/L (ref 98–110)
Creat: 1.16 mg/dL (ref 0.70–1.18)
GFR, EST NON AFRICAN AMERICAN: 62 mL/min (ref >=60)
GFR, Est African American: 72 mL/min (ref >=60)
GLUCOSE: 90 mg/dL (ref 70–99)
POTASSIUM: 4.9 mmol/L (ref 3.5–5.3)
Sodium: 138 mmol/L (ref 135–146)
Total Bilirubin: 0.9 mg/dL (ref 0.2–1.2)
Total Protein: 7 g/dL (ref 6.1–8.1)

## 2015-10-09 LAB — PSA: PSA: 4.32 ng/mL — AB (ref <=4.00)

## 2015-11-06 ENCOUNTER — Ambulatory Visit (INDEPENDENT_AMBULATORY_CARE_PROVIDER_SITE_OTHER): Payer: Medicare Other | Admitting: Family Medicine

## 2015-11-06 ENCOUNTER — Encounter: Payer: Self-pay | Admitting: Family Medicine

## 2015-11-06 VITALS — BP 142/78 | HR 58 | Temp 98.2°F | Resp 14 | Ht 69.5 in | Wt 243.0 lb

## 2015-11-06 DIAGNOSIS — I10 Essential (primary) hypertension: Secondary | ICD-10-CM | POA: Diagnosis not present

## 2015-11-06 NOTE — Progress Notes (Signed)
Subjective:    Patient ID: [name], male    DOB: 07-25-1940, 75 y.o.   MRN: [id number]  Hypertension   10/08/15  I have not seen this patient in quite a while. He is here today for a checkup. His blood pressure is extremely high 176/82. He is currently taking losartan and bystolic 5 mg a day. This medication is very expensive and I do not believe he will be able to afford this moving forward. He also states that he is under tremendous stress. 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 Unfortunately, his son is also battling heroin addiction. I will see this is causing tremendous anxiety for this patient as he is trying to help his son bowel through this. He believes this is raising his blood pressure in part. He also has a history of prostate cancer status post cryotherapy. His PSA was checked at this office in October and was found to be elevated. It was 3 previously. It had risen to greater than 4.  He did not follow-up with his urologist at [facility] as recommended. Otherwise he is doing well.  At that time, my plan was: Discontineu bystolic and replace with atenolol 50 mg poqday for cost.  Continue losartan 100 mg by mouth daily. Add hydrochlorothiazide 25 mg by mouth daily and recheck in one month. I will check a CBC, CMP, fasting lipid panel. I will also check a PSA. His PSA continues to rise, he will definitely need to follow with his urologist at [facility]. The patient also has Candida intertrigo underneath the fold of his stomach at his waistline as well as in his left groin.  I will treat this with Lotrisone cream twice daily for 10 days.  11/06/15 He is here today to recheck his blood pressure. I rechecked his blood pressure and found it to be similar. He is tolerating the medication without difficulty. He denies any chest pain shortness of breath or dyspnea on exertion. He has still not scheduled follow-up with his urologist at [facility]. Past Medical  History  Diagnosis Date  . Brain tumor ([place])   . Hypertension   . 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 Brain surgery    . Appendectomy     Current Outpatient Prescriptions on File Prior to Visit  Medication Sig Dispense Refill  . atenolol (TENORMIN) 50 MG tablet Take 1 tablet (50 mg total) by mouth daily. 90 tablet 3  . hydrochlorothiazide (HYDRODIURIL) 25 MG tablet Take 1 tablet (25 mg total) by mouth daily. 90 tablet 3  . losartan (COZAAR) 100 MG tablet Take 1 tablet (100 mg total) by mouth daily. 90 tablet 2  . vitamin B-12 (CYANOCOBALAMIN) 1000 MCG tablet Take 1,000 mcg by mouth daily.     No current facility-administered medications on file prior to visit.   Allergies  Allergen Reactions  . Amlodipine     Leg swelling  . Red Dye Hives   Social History   Social History  . Marital Status: Married    Spouse Name: N/A  . Number of Children: N/A  . Years of Education: N/A   Occupational History  . Not on file.   Social History Main Topics  . Smoking status: Former Research scientist (life sciences)  . Smokeless tobacco: Never Used  . Alcohol Use: No  . Drug  Use: No  . Sexual Activity: Not Currently   Other Topics Concern  . Not on file   Social History Narrative     Review of Systems  All other systems reviewed and are negative.      Objective:   Physical Exam  Constitutional: He appears well-developed and well-nourished.  Cardiovascular: Normal rate, regular rhythm, normal heart sounds and intact distal pulses.  Exam reveals no gallop and no friction rub.   No murmur heard. Pulmonary/Chest: Effort normal and breath sounds normal. No respiratory distress. He has no wheezes. He has no rales.  Abdominal: Soft. Bowel sounds are normal. He exhibits no distension. There is no tenderness. There is no rebound.  Musculoskeletal: He exhibits no edema.  Vitals reviewed.           Assessment & Plan:  Benign essential HTN  Blood pressure is much better. Continue the current medications. I have recommended that he check his blood pressure everyday at home. I would like his goal blood pressure be less than 140/90. I strongly encouraged the patient to follow-up with his urologist at [facility] regarding his prostate cancer.

## 2016-02-05 ENCOUNTER — Other Ambulatory Visit: Payer: Self-pay | Admitting: Physician Assistant

## 2016-02-05 NOTE — Telephone Encounter (Signed)
Medication refilled per protocol. 

## 2016-02-06 ENCOUNTER — Other Ambulatory Visit: Payer: Self-pay | Admitting: Family Medicine

## 2016-02-06 MED ORDER — LOSARTAN POTASSIUM 100 MG PO TABS: 100.0000 mg | ORAL_TABLET | Freq: Every day | ORAL | 3 refills | Status: DC

## 2016-02-06 NOTE — Telephone Encounter (Signed)
Medication refilled per protocol. 

## 2016-04-28 ENCOUNTER — Ambulatory Visit (INDEPENDENT_AMBULATORY_CARE_PROVIDER_SITE_OTHER): Payer: Medicare Other | Admitting: Family Medicine

## 2016-04-28 ENCOUNTER — Encounter: Payer: Self-pay | Admitting: Family Medicine

## 2016-04-28 VITALS — BP 140/72 | HR 72 | Temp 98.4°F | Resp 16 | Ht 69.5 in | Wt 247.0 lb

## 2016-04-28 DIAGNOSIS — J208 Acute bronchitis due to other specified organisms: Secondary | ICD-10-CM | POA: Diagnosis not present

## 2016-04-28 MED ORDER — AZITHROMYCIN 250 MG PO TABS: ORAL_TABLET | ORAL | 0 refills | Status: DC

## 2016-04-28 NOTE — Progress Notes (Signed)
Subjective:    Patient ID: [name], male    DOB: 08-18-1940, 75 y.o.   MRN: [id number]  HPI  Patient presents with a 10-14 day history of a cough. He reports chest congestion. Last week he was having some mild dyspnea and wheezing in the morning. However he started taking Mucinex and his symptoms dramatically improved. This week he states he feels almost back to normal. He denies any chest pain. He denies any fevers. He denies any pleurisy. He denies any purulent sputum. He denies any rhinorrhea or sinus pain. He denies any sore throat. Examination today is completely normal. His lungs are clear to auscultation bilaterally. Past Medical History:  Diagnosis Date  . Brain tumor ([place])   . 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 Hypertension    Past Surgical History:  Procedure Laterality Date  . APPENDECTOMY    . BRAIN SURGERY     Current Outpatient Prescriptions on File Prior to Visit  Medication Sig Dispense Refill  . atenolol (TENORMIN) 50 MG tablet Take 1 tablet (50 mg total) by mouth daily. 90 tablet 3  . hydrochlorothiazide (HYDRODIURIL) 25 MG tablet Take 1 tablet (25 mg total) by mouth daily. 90 tablet 3  . losartan (COZAAR) 100 MG tablet Take 1 tablet (100 mg total) by mouth daily. 90 tablet 3  . vitamin B-12 (CYANOCOBALAMIN) 1000 MCG tablet Take 1,000 mcg by mouth daily.     No current facility-administered medications on file prior to visit.    Allergies  Allergen Reactions  . Amlodipine     Leg swelling  . Red Dye Hives   Social History   Social History  . Marital status: Married    Spouse name: N/A  . Number of children: N/A  . Years of education: N/A   Occupational History  . Not on file.   Social History Main Topics  . Smoking status: Former Research scientist (life sciences)  . Smokeless tobacco: Never Used  . Alcohol use No  . Drug use: No  . Sexual activity: Not Currently   Other Topics Concern  . Not on file    Social History Narrative  . No narrative on file      Review of Systems  All other systems reviewed and are negative.      Objective:   Physical Exam  HENT:  Right Ear: External ear normal.  Left Ear: External ear normal.  Nose: Nose normal.  Mouth/Throat: Oropharynx is clear and moist. No oropharyngeal exudate.  Eyes: Conjunctivae are normal. Pupils are equal, round, and reactive to light. Right eye exhibits no discharge. Left eye exhibits no discharge.  Neck: Neck supple. No JVD present. No thyromegaly present.  Cardiovascular: Normal rate, regular rhythm and normal heart sounds.   Pulmonary/Chest: Effort normal and breath sounds normal. No respiratory distress. He has no wheezes. He has no rales.  Lymphadenopathy:    He has no cervical adenopathy.  Vitals reviewed.         Assessment & Plan:  Acute bronchitis due to other specified organisms - Plan: azithromycin (ZITHROMAX) 250 MG tablet  I believe the patient likely had viral bronchitis. I continue to encourage tincture of time. Symptoms seem to be resolving nicely on their own. He can continue to use Mucinex for cough. I did give the patient a prescription for a Z-Pak. I gave him strict instructions not to fill it unless he develops a new worsening fever greater  than 101, worsening cough productive of purulent sputum, or shortness of breath.

## 2016-10-22 ENCOUNTER — Other Ambulatory Visit: Payer: Self-pay | Admitting: Family Medicine

## 2016-10-22 DIAGNOSIS — I10 Essential (primary) hypertension: Secondary | ICD-10-CM

## 2016-10-23 ENCOUNTER — Other Ambulatory Visit: Payer: Self-pay | Admitting: *Deleted

## 2016-10-23 DIAGNOSIS — I10 Essential (primary) hypertension: Secondary | ICD-10-CM

## 2016-10-23 MED ORDER — HYDROCHLOROTHIAZIDE 25 MG PO TABS: 25.0000 mg | ORAL_TABLET | Freq: Every day | ORAL | 3 refills | Status: DC

## 2017-02-05 ENCOUNTER — Other Ambulatory Visit: Payer: Self-pay | Admitting: Family Medicine

## 2017-05-18 ENCOUNTER — Other Ambulatory Visit: Payer: Self-pay | Admitting: Family Medicine

## 2017-05-18 DIAGNOSIS — I10 Essential (primary) hypertension: Secondary | ICD-10-CM

## 2017-07-21 ENCOUNTER — Other Ambulatory Visit: Payer: Self-pay | Admitting: Family Medicine

## 2017-09-22 ENCOUNTER — Other Ambulatory Visit: Payer: Self-pay | Admitting: Family Medicine

## 2017-09-22 DIAGNOSIS — I10 Essential (primary) hypertension: Secondary | ICD-10-CM

## 2017-10-26 ENCOUNTER — Other Ambulatory Visit: Payer: Self-pay | Admitting: Family Medicine

## 2017-10-26 DIAGNOSIS — I10 Essential (primary) hypertension: Secondary | ICD-10-CM

## 2017-11-26 ENCOUNTER — Telehealth: Payer: Self-pay | Admitting: Family Medicine

## 2017-11-26 DIAGNOSIS — I10 Essential (primary) hypertension: Secondary | ICD-10-CM

## 2017-11-26 NOTE — Telephone Encounter (Signed)
Pt came in with medications (hydrochlorothizade, Atenolol, and Losartan) wants [place] to start calling in 90 days instead of 30 day supply because it is cheaper for him, please call in 90 day supply to [place].

## 2017-11-27 ENCOUNTER — Encounter: Payer: Self-pay | Admitting: Family Medicine

## 2017-11-27 MED ORDER — HYDROCHLOROTHIAZIDE 25 MG PO TABS: 25.0000 mg | ORAL_TABLET | Freq: Every day | ORAL | 0 refills | Status: DC

## 2017-11-27 MED ORDER — LOSARTAN POTASSIUM 100 MG PO TABS: 100.0000 mg | ORAL_TABLET | Freq: Every day | ORAL | 0 refills | Status: DC

## 2017-11-27 MED ORDER — ATENOLOL 50 MG PO TABS: 50.0000 mg | ORAL_TABLET | Freq: Every day | ORAL | 0 refills | Status: DC

## 2017-11-27 NOTE — Telephone Encounter (Signed)
Meds sent to pharm - pt needs ov and labs - will send letter

## 2017-12-01 ENCOUNTER — Other Ambulatory Visit: Payer: Self-pay | Admitting: Family Medicine

## 2017-12-01 DIAGNOSIS — I10 Essential (primary) hypertension: Secondary | ICD-10-CM

## 2018-02-19 ENCOUNTER — Encounter: Payer: Self-pay | Admitting: Family Medicine

## 2018-02-19 ENCOUNTER — Ambulatory Visit (INDEPENDENT_AMBULATORY_CARE_PROVIDER_SITE_OTHER): Payer: Medicare Other | Admitting: Family Medicine

## 2018-02-19 VITALS — BP 128/70 | HR 48 | Temp 98.0°F | Resp 18 | Ht 69.5 in | Wt 245.0 lb

## 2018-02-19 DIAGNOSIS — T7840XA Allergy, unspecified, initial encounter: Secondary | ICD-10-CM

## 2018-02-19 MED ORDER — PREDNISONE 20 MG PO TABS: ORAL_TABLET | ORAL | 0 refills | Status: DC

## 2018-02-19 MED ORDER — METHYLPREDNISOLONE ACETATE 40 MG/ML IJ SUSP
40.0000 mg | Freq: Once | INTRAMUSCULAR | Status: AC
  Administered 2018-02-19: 60 mg via INTRAMUSCULAR

## 2018-02-19 NOTE — Progress Notes (Addendum)
Subjective:    Patient ID: [name], male    DOB: 1941-05-30, 77 y.o.   MRN: [id number]  HPI  Patient has not been seen since October 2017.  Patient presents urgently today with a rash on his legs.  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  He received numerous chigger bites or some type of insect bite on the medial aspect of his shin calf and upper thigh bilaterally.  The spots were punctate erythematous papules however they were extremely itchy.  He began scratching them vigorously the same day.  The field had been sprayed with some type of herbicide or pesticide and he believes it was on his hands as he was scratching.  Now there are erythematous indurated plaques and areas where he was bitten.  They have sharp well-circumscribed borders.  The skin is indurated and erythematous.  It is like a wheal.  There are also individual urticarial papules on his beltline forming.  He states that the initial plaques began as these urticarial hives and progressed into the plaque-like wheals.   Current Outpatient Medications on File Prior to Visit  Medication Sig Dispense Refill  . atenolol (TENORMIN) 50 MG tablet Take 1 tablet (50 mg total) by mouth daily. 90 tablet 0  . atenolol (TENORMIN) 50 MG tablet TAKE 1 TABLET BY MOUTH ONCE DAILY 90 tablet 0  . azithromycin (ZITHROMAX) 250 MG tablet 2 tabs poqday1, 1 tab poqday 2-5 6 tablet 0  . hydrochlorothiazide (HYDRODIURIL) 25 MG tablet Take 1 tablet (25 mg total) by mouth daily. Needs office visit and labs before further refills 30 tablet 0  . hydrochlorothiazide (HYDRODIURIL) 25 MG tablet Take 1 tablet (25 mg total) by mouth daily. Needs office visit and labs before further refills 90 tablet 0  . hydrochlorothiazide (HYDRODIURIL) 25 MG tablet TAKE 1 TABLET BY MOUTH ONCE DAILY **NEEDS  OFFICE  VISIT  AND  LABS  BEFORE  FURTHER  REFILLS** 90 tablet 0  . losartan (COZAAR) 100 MG tablet TAKE ONE TABLET BY MOUTH ONCE DAILY 90  tablet 3  . losartan (COZAAR) 100 MG tablet Take 1 tablet (100 mg total) by mouth daily. 90 tablet 0  . vitamin B-12 (CYANOCOBALAMIN) 1000 MCG tablet Take 1,000 mcg by mouth daily.    . vitamin E (VITAMIN E) 400 UNIT capsule Take 400 Units by mouth daily.     No current facility-administered medications on file prior to visit.     Allergies  Allergen Reactions  . Amlodipine     Leg swelling  . Red Dye Hives   Past Medical History:  Diagnosis Date  . Brain tumor ([place])   . Cancer [facility])    prostate s/p cry[facility]ablation at [facility] 2/13, Gleason 7 of left lateral base  . Hypertension    Past Surgical History:  Procedure Laterality Date  . APPENDECTOMY    . BRAIN SURGERY      Social History   Socioeconomic History  . Marital status: Married    Spouse name: Not on file  . Number of children: Not on file  . Years of education: Not on file  . Highest education level: Not on file  Occupational History  . Not on file  Social Needs  . Financial resource strain: Not on file  . Food insecurity:    Worry: Not on file    Inability: Not on file  . Transportation needs:    Medical: Not on file  Non-medical: Not on file  Tobacco Use  . Smoking status: Former Research scientist (life sciences)  . Smokeless tobacco: Never Used  Substance and Sexual Activity  . Alcohol use: No    Alcohol/week: 0.0 standard drinks  . Drug use: No  . Sexual activity: Not Currently  Lifestyle  . Physical activity:    Days per week: Not on file    Minutes per session: Not on file  . Stress: Not on file  Relationships  . Social connections:    Talks on phone: Not on file    Gets together: Not on file    Attends religious service: Not on file    Active member of club or organization: Not on file    Attends meetings of clubs or organizations: Not on file    Relationship status: Not on file  . Intimate partner violence:    Fear of current or ex partner: Not on file    Emotionally abused: Not on file    Physically abused:  Not on file    Forced sexual activity: Not on file  Other Topics Concern  . Not on file  Social History Narrative  . Not on file      Review of Systems  All other systems reviewed and are negative.      Objective:   Physical Exam  HENT:  Right Ear: External ear normal.  Left Ear: External ear normal.  Nose: Nose normal.  Mouth/Throat: Oropharynx is clear and moist. No oropharyngeal exudate.  Eyes: Pupils are equal, round, and reactive to light. Conjunctivae are normal. Right eye exhibits no discharge. Left eye exhibits no discharge.  Neck: Neck supple. No JVD present. No thyromegaly present.  Cardiovascular: Normal rate, regular rhythm and normal heart sounds.  Pulmonary/Chest: Effort normal and breath sounds normal. No respiratory distress. He has no wheezes. He has no rales.  Lymphadenopathy:    He has no cervical adenopathy.  Skin: Rash noted. There is erythema.     Vitals reviewed.         Assessment & Plan:  Allergic reaction, initial encounter - Plan: CBC with Differential/Platelet, COMPLETE METABOLIC PANEL WITH GFR  Rash appears to be urticarial in nature.  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  It has been present now for 5 days and seems to be spreading and worsening.  Therefore I will treat the patient with Depo-Medrol 60 mg IM x1 and then a prednisone taper pack.  He can use Benadryl 25 mg every 6 hours as needed for itching.  Reassess the patient on Monday or seek medical attention immediately if worsening.  I will also check a CBC as well as a CMP to evaluate for baseline lab abnormalities given the fact the patient has not been seen in 2 years.

## 2018-02-19 NOTE — Addendum Note (Signed)
Addended by: Shary Decamp B on: 02/19/2018 03:02 PM   Modules accepted: Orders

## 2018-02-22 ENCOUNTER — Encounter: Payer: Self-pay | Admitting: Family Medicine

## 2018-02-22 ENCOUNTER — Ambulatory Visit (INDEPENDENT_AMBULATORY_CARE_PROVIDER_SITE_OTHER): Payer: Medicare Other | Admitting: Family Medicine

## 2018-02-22 VITALS — BP 130/70 | HR 46 | Temp 98.0°F | Resp 16 | Ht 69.5 in | Wt 246.0 lb

## 2018-02-22 DIAGNOSIS — Z8546 Personal history of malignant neoplasm of prostate: Secondary | ICD-10-CM | POA: Diagnosis not present

## 2018-02-22 DIAGNOSIS — E7800 Pure hypercholesterolemia, unspecified: Secondary | ICD-10-CM | POA: Diagnosis not present

## 2018-02-22 DIAGNOSIS — I10 Essential (primary) hypertension: Secondary | ICD-10-CM

## 2018-02-22 MED ORDER — HYDROCHLOROTHIAZIDE 25 MG PO TABS: 25.0000 mg | ORAL_TABLET | Freq: Every day | ORAL | 3 refills | Status: DC

## 2018-02-22 MED ORDER — LOSARTAN POTASSIUM 100 MG PO TABS: 100.0000 mg | ORAL_TABLET | Freq: Every day | ORAL | 3 refills | Status: DC

## 2018-02-22 MED ORDER — ATENOLOL 25 MG PO TABS: 25.0000 mg | ORAL_TABLET | Freq: Every day | ORAL | 3 refills | Status: DC

## 2018-02-22 NOTE — Progress Notes (Signed)
Subjective:    Patient ID: [name], male    DOB: 1941/02/10, 77 y.o.   MRN: [id number]  HPI 02/19/18 Patient has not been seen since October 2017.  Patient presents urgently today with a rash on his legs.  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  He received numerous chigger bites or some type of insect bite on the medial aspect of his shin calf and upper thigh bilaterally.  The spots were punctate erythematous papules however they were extremely itchy.  He began scratching them vigorously the same day.  The field had been sprayed with some type of herbicide or pesticide and he believes it was on his hands as he was scratching.  Now there are erythematous indurated plaques and areas where he was bitten.  They have sharp well-circumscribed borders.  The skin is indurated and erythematous.  It is like a wheal.  There are also individual urticarial papules on his beltline forming.  He states that the initial plaques began as these urticarial hives and progressed into the plaque-like wheals.  At that time, my plan was:  Rash appears to be urticarial in nature.  I believe the patient having allergic reaction to whatever the herbicide or pesticide was in the field that he was working in.  It has been present now for 5 days and seems to be spreading and worsening.  Therefore I will treat the patient with Depo-Medrol 60 mg IM x1 and then a prednisone taper pack.  He can use Benadryl 25 mg every 6 hours as needed for itching.  Reassess the patient on Monday or seek medical attention immediately if worsening.  I will also check a CBC as well as a CMP to evaluate for baseline lab abnormalities given the fact the patient has not been seen in 2 years.  02/22/18 Patient is here today for follow-up of his essential hypertension, hyperlipidemia.  He also has history of prostate cancer status post cryoablation performed at [facility] in 2013.  He has had spotty follow-up with his urologist  since that time.  His last PSA was checked to my knowledge in 2017 and at that time was elevated at 4.32.  I am unaware of any follow-up since her any treatment plan.  He is overdue to recheck a PSA today.  Patient states that he has not seen the urologist since the cryoablation.  He denies any nocturia or frequency or urgency.  He denies any bone pain or skeletal pain.  He is also due for several vaccinations including Pneumovax 23, Prevnar 13, the annual flu shot.  His heart rate today is very low at 46.  He denies any dizziness lightheadedness or near syncope.  During his exam I checked his heart rate and found it to be in the low 50s.   Current Outpatient Medications on File Prior to Visit  Medication Sig Dispense Refill  . atenolol (TENORMIN) 50 MG tablet Take 1 tablet (50 mg total) by mouth daily. 90 tablet 0  . hydrochlorothiazide (HYDRODIURIL) 25 MG tablet Take 1 tablet (25 mg total) by mouth daily. Needs office visit and labs before further refills 30 tablet 0  . losartan (COZAAR) 100 MG tablet TAKE ONE TABLET BY MOUTH ONCE DAILY 90 tablet 3  . predniSONE (DELTASONE) 20 MG tablet 3 tabs poqday 1-2, 2 tabs poqday 3-4, 1 tab poqday 5-6 12 tablet 0  . vitamin B-12 (CYANOCOBALAMIN) 1000 MCG tablet Take 1,000 mcg by mouth  daily.    . vitamin E (VITAMIN E) 400 UNIT capsule Take 400 Units by mouth daily.     No current facility-administered medications on file prior to visit.     Allergies  Allergen Reactions  . Amlodipine     Leg swelling  . Red Dye Hives   Past Medical History:  Diagnosis Date  . Brain tumor ([place])   . Cancer [facility])    prostate s/p cryoablation at [facility] 2/13, Gleason 7 of left lateral base  . Hypertension    Past Surgical History:  Procedure Laterality Date  . APPENDECTOMY    . BRAIN SURGERY      Social History   Socioeconomic History  . Marital status: Married    Spouse name: Not on file  . Number of children: Not on file  . Years of education: Not on  file  . Highest education level: Not on file  Occupational History  . Not on file  Social Needs  . Financial resource strain: Not on file  . Food insecurity:    Worry: Not on file    Inability: Not on file  . Transportation needs:    Medical: Not on file    Non-medical: Not on file  Tobacco Use  . Smoking status: Former Research scientist (life sciences)  . Smokeless tobacco: Never Used  Substance and Sexual Activity  . Alcohol use: No    Alcohol/week: 0.0 standard drinks  . Drug use: No  . Sexual activity: Not Currently  Lifestyle  . Physical activity:    Days per week: Not on file    Minutes per session: Not on file  . Stress: Not on file  Relationships  . Social connections:    Talks on phone: Not on file    Gets together: Not on file    Attends religious service: Not on file    Active member of club or organization: Not on file    Attends meetings of clubs or organizations: Not on file    Relationship status: Not on file  . Intimate partner violence:    Fear of current or ex partner: Not on file    Emotionally abused: Not on file    Physically abused: Not on file    Forced sexual activity: Not on file  Other Topics Concern  . Not on file  Social History Narrative  . Not on file      Review of Systems  All other systems reviewed and are negative.      Objective:   Physical Exam  HENT:  Right Ear: External ear normal.  Left Ear: External ear normal.  Nose: Nose normal.  Mouth/Throat: Oropharynx is clear and moist. No oropharyngeal exudate.  Eyes: Pupils are equal, round, and reactive to light. Conjunctivae are normal. Right eye exhibits no discharge. Left eye exhibits no discharge.  Neck: Neck supple. No JVD present. No thyromegaly present.  Cardiovascular: Normal rate, regular rhythm and normal heart sounds.  Pulmonary/Chest: Effort normal and breath sounds normal. No respiratory distress. He has no wheezes. He has no rales.  Lymphadenopathy:    He has no cervical adenopathy.    Skin: Rash noted. There is erythema.     Vitals reviewed.    She is gradually improving and has faded in color on prednisone     Assessment & Plan:  Benign essential HTN - Plan: CBC with Differential/Platelet, COMPLETE METABOLIC PANEL WITH GFR, Lipid panel  Pure hypercholesterolemia - Plan: CBC with Differential/Platelet, COMPLETE METABOLIC PANEL WITH GFR,  Lipid panel  History of prostate cancer - Plan: PSA Blood pressures today is acceptable.  I will reduce the dose of atenolol to 25 mg a day due to bradycardia.  I have asked the patient to monitor his heart rate as well as his blood pressure.  I will check a CMP, fasting lipid panel to measure and monitor his cholesterol along with his kidney function.  I will also check a PSA.  If his PSA has elevated further, we will need to proceed with imaging to evaluate for any spread of the prostate cancer outside the prostate gland itself and refer the patient back to urology for further treatment.  I recommended Pneumovax 23 and a flu shot but the patient declined today.

## 2018-02-23 LAB — CBC WITH DIFFERENTIAL/PLATELET
BASOS ABS: 24 {cells}/uL (ref 0–200)
Basophils Relative: 0.2 %
EOS ABS: 0 {cells}/uL — AB (ref 15–500)
Eosinophils Relative: 0 %
HCT: 37.5 % — ABNORMAL LOW (ref 38.5–50.0)
HEMOGLOBIN: 13.3 g/dL (ref 13.2–17.1)
Lymphs Abs: 1342 cells/uL (ref 850–3900)
MCH: 32.6 pg (ref 27.0–33.0)
MCHC: 35.5 g/dL (ref 32.0–36.0)
MCV: 91.9 fL (ref 80.0–100.0)
MPV: 11.4 fL (ref 7.5–12.5)
Monocytes Relative: 5.6 %
Neutro Abs: 10150 cells/uL — ABNORMAL HIGH (ref 1500–7800)
Neutrophils Relative %: 83.2 %
Platelets: 289 10*3/uL (ref 140–400)
RBC: 4.08 10*6/uL — ABNORMAL LOW (ref 4.20–5.80)
RDW: 13.3 % (ref 11.0–15.0)
TOTAL LYMPHOCYTE: 11 %
WBC mixed population: 683 cells/uL (ref 200–950)
WBC: 12.2 10*3/uL — ABNORMAL HIGH (ref 3.8–10.8)

## 2018-02-23 LAB — COMPLETE METABOLIC PANEL WITH GFR
AG Ratio: 1.6 (calc) (ref 1.0–2.5)
ALKALINE PHOSPHATASE (APISO): 73 U/L (ref 40–115)
ALT: 23 U/L (ref 9–46)
AST: 20 U/L (ref 10–35)
Albumin: 4.4 g/dL (ref 3.6–5.1)
BILIRUBIN TOTAL: 0.6 mg/dL (ref 0.2–1.2)
BUN / CREAT RATIO: 26 (calc) — AB (ref 6–22)
BUN: 35 mg/dL — ABNORMAL HIGH (ref 7–25)
CO2: 26 mmol/L (ref 20–32)
CREATININE: 1.37 mg/dL — AB (ref 0.70–1.18)
Calcium: 9.5 mg/dL (ref 8.6–10.3)
Chloride: 104 mmol/L (ref 98–110)
GFR, Est African American: 58 mL/min/{1.73_m2} — ABNORMAL LOW (ref >=60)
GFR, Est Non African American: 50 mL/min/{1.73_m2} — ABNORMAL LOW (ref >=60)
GLOBULIN: 2.8 g/dL (ref 1.9–3.7)
Glucose, Bld: 94 mg/dL (ref 65–99)
Potassium: 5 mmol/L (ref 3.5–5.3)
SODIUM: 140 mmol/L (ref 135–146)
Total Protein: 7.2 g/dL (ref 6.1–8.1)

## 2018-02-23 LAB — LIPID PANEL
CHOL/HDL RATIO: 5.4 (calc) — AB (ref <5.0)
CHOLESTEROL: 252 mg/dL — AB (ref <200)
HDL: 47 mg/dL (ref >40)
LDL Cholesterol (Calc): 182 mg/dL (calc) — ABNORMAL HIGH
Non-HDL Cholesterol (Calc): 205 mg/dL (calc) — ABNORMAL HIGH (ref <130)
Triglycerides: 106 mg/dL (ref <150)

## 2018-02-23 LAB — PSA: PSA: 1.9 ng/mL (ref <=4.0)

## 2018-03-03 ENCOUNTER — Other Ambulatory Visit: Payer: Self-pay | Admitting: Family Medicine

## 2018-03-03 ENCOUNTER — Encounter: Payer: Self-pay | Admitting: Family Medicine

## 2018-03-03 MED ORDER — ATORVASTATIN CALCIUM 40 MG PO TABS: 40.0000 mg | ORAL_TABLET | Freq: Every day | ORAL | 1 refills | Status: DC

## 2018-03-26 ENCOUNTER — Telehealth: Payer: Self-pay | Admitting: Family Medicine

## 2018-03-26 DIAGNOSIS — E7800 Pure hypercholesterolemia, unspecified: Secondary | ICD-10-CM

## 2018-03-26 NOTE — Telephone Encounter (Signed)
Patient came in stating that he has been taking Lipitor for a few weeks and has noticed some short term memory loss that has concerned him. Patient would like to know if medication can be changed.

## 2018-03-29 NOTE — Telephone Encounter (Signed)
Switch to crestor 20 mg poqday

## 2018-03-31 MED ORDER — ROSUVASTATIN CALCIUM 20 MG PO TABS: 20.0000 mg | ORAL_TABLET | Freq: Every day | ORAL | 1 refills | Status: DC

## 2018-03-31 NOTE — Telephone Encounter (Signed)
Medication sent to pharmacy. Left message return call

## 2018-04-01 NOTE — Telephone Encounter (Signed)
Spoke with patient and informed him per Dr.Pickard medication was changed ans sent to the pharmacy. Patient verbalized understanding.

## 2018-04-02 ENCOUNTER — Telehealth: Payer: Self-pay | Admitting: Family Medicine

## 2018-04-02 NOTE — Telephone Encounter (Signed)
Patient came in with concerns of the cost of Crestor. Patient states cost of medication was $500. Patient currently only has Medicare Part A and B and no prescription coverage. [facility] gave him a list of medications that they cover such as Fenofibrate 145 MG for $24 for a 90 day supply and Gemfibrozil 600 MG 90 day supply for $24. Patient would like to know if you recommend one of these medications and if they will cause him to have the memory issues. Please advise?

## 2018-04-02 NOTE — Telephone Encounter (Signed)
Try pravastatin 40 poqday.  Fenofibrate and gemfibrozil are not appropriate for his ldl.

## 2018-04-09 NOTE — Telephone Encounter (Signed)
LMTRC

## 2019-04-29 ENCOUNTER — Other Ambulatory Visit: Payer: Self-pay | Admitting: Family Medicine

## 2019-08-02 ENCOUNTER — Other Ambulatory Visit: Payer: Self-pay | Admitting: Family Medicine

## 2019-09-02 ENCOUNTER — Encounter: Payer: Self-pay | Admitting: Family Medicine

## 2019-09-02 ENCOUNTER — Ambulatory Visit (INDEPENDENT_AMBULATORY_CARE_PROVIDER_SITE_OTHER): Payer: Medicare Other | Admitting: Family Medicine

## 2019-09-02 ENCOUNTER — Other Ambulatory Visit: Payer: Self-pay

## 2019-09-02 VITALS — BP 120/80 | HR 50 | Temp 97.4°F | Resp 16 | Ht 69.5 in | Wt 257.0 lb

## 2019-09-02 DIAGNOSIS — I10 Essential (primary) hypertension: Secondary | ICD-10-CM | POA: Diagnosis not present

## 2019-09-02 DIAGNOSIS — Z8546 Personal history of malignant neoplasm of prostate: Secondary | ICD-10-CM

## 2019-09-02 DIAGNOSIS — E7800 Pure hypercholesterolemia, unspecified: Secondary | ICD-10-CM | POA: Diagnosis not present

## 2019-09-02 MED ORDER — LOSARTAN POTASSIUM 100 MG PO TABS: 100.0000 mg | ORAL_TABLET | Freq: Every day | ORAL | 3 refills | Status: DC

## 2019-09-02 MED ORDER — ATENOLOL 25 MG PO TABS: 25.0000 mg | ORAL_TABLET | Freq: Every day | ORAL | 3 refills | Status: DC

## 2019-09-02 NOTE — Addendum Note (Signed)
Addended by: Jenna Luo T on: 09/02/2019 02:55 PM   Modules accepted: Orders

## 2019-09-02 NOTE — Progress Notes (Signed)
Subjective:    Patient ID: [name], male    DOB: September 09, 1940, 79 y.o.   MRN: [id number]  Medication Refill   Patient has not been seen since 2019.  He has a history of hypertension and is currently on atenolol 25 mg a day and losartan 100 mg a day.  He denies any chest pain although he does have some mild shortness of breath but he attributes this to weight gain.  He has gained 11 pounds since I last saw him.  He believes the extra weight is making him more easily winded.  However he denies any orthopnea or any paroxysmal nocturnal dyspnea.  He denies any angina.  He does have trace pitting edema in both shins but no obvious signs of pulmonary edema.  He is due for Pneumovax 23, Prevnar 13 but he refused both of those today.  He is also due for the Covid vaccination and I recommended the strongly that he will consider it he is not certain if he wants to receive that shot.  He declines a colonoscopy.  He is due for fasting lab work.  Past medical history is significant for "prostate cancer that was frozen".  This was performed many years ago at [facility].  His most recent PSA was in 2019 and was 1.9. Wt Readings from Last 3 Encounters:  09/02/19 257 lb (116.6 kg)  02/22/18 246 lb (111.6 kg)  02/19/18 245 lb (111.1 kg)     Current Outpatient Medications on File Prior to Visit  Medication Sig Dispense Refill  . atenolol (TENORMIN) 25 MG tablet Take 1 tablet by mouth once daily 30 tablet 0  . losartan (COZAAR) 100 MG tablet Take 1 tablet by mouth once daily 30 tablet 0   No current facility-administered medications on file prior to visit.    Allergies  Allergen Reactions  . Amlodipine     Leg swelling  . Red Dye Hives   Past Medical History:  Diagnosis Date  . Brain tumor ([place])   . Cancer [facility])    prostate s/p cryoablation at [facility] 2/13, Gleason 7 of left lateral base  . Hypertension    Past Surgical History:  Procedure Laterality Date  . APPENDECTOMY    . BRAIN SURGERY       Social History   Socioeconomic History  . Marital status: Married    Spouse name: Not on file  . Number of children: Not on file  . Years of education: Not on file  . Highest education level: Not on file  Occupational History  . Not on file  Tobacco Use  . Smoking status: Former Research scientist (life sciences)  . Smokeless tobacco: Never Used  Substance and Sexual Activity  . Alcohol use: No    Alcohol/week: 0.0 standard drinks  . Drug use: No  . Sexual activity: Not Currently  Other Topics Concern  . Not on file  Social History Narrative  . Not on file   Social Determinants of Health   Financial Resource Strain:   . Difficulty of Paying Living Expenses: Not on file  Food Insecurity:   . Worried About Charity fundraiser in the Last Year: Not on file  . Ran Out of Food in the Last Year: Not on file  Transportation Needs:   . Lack of Transportation (Medical): Not on file  . Lack of Transportation (Non-Medical): Not on file  Physical Activity:   . Days of Exercise per Week: Not on file  . Minutes of Exercise  per Session: Not on file  Stress:   . Feeling of Stress : Not on file  Social Connections:   . Frequency of Communication with Friends and Family: Not on file  . Frequency of Social Gatherings with Friends and Family: Not on file  . Attends Religious Services: Not on file  . Active Member of Clubs or Organizations: Not on file  . Attends Archivist Meetings: Not on file  . Marital Status: Not on file  Intimate Partner Violence:   . Fear of Current or Ex-Partner: Not on file  . Emotionally Abused: Not on file  . Physically Abused: Not on file  . Sexually Abused: Not on file      Review of Systems  All other systems reviewed and are negative.      Objective:   Physical Exam  Constitutional: He appears well-developed and well-nourished.  HENT:  Right Ear: External ear normal.  Left Ear: External ear normal.  Nose: Nose normal.  Mouth/Throat: Oropharynx is clear  and moist. No oropharyngeal exudate.  Eyes: Pupils are equal, round, and reactive to light. Conjunctivae are normal. Right eye exhibits no discharge. Left eye exhibits no discharge.  Neck: No JVD present. No thyromegaly present.  Cardiovascular: Normal rate, regular rhythm and normal heart sounds. Exam reveals no gallop and no friction rub.  No murmur heard. Pulmonary/Chest: Effort normal and breath sounds normal. No respiratory distress. He has no wheezes. He has no rales.  Abdominal: Soft. Bowel sounds are normal. He exhibits no distension. There is no abdominal tenderness. There is no rebound and no guarding.  Musculoskeletal:        General: Edema present.     Cervical back: Neck supple.  Lymphadenopathy:    He has no cervical adenopathy.  Vitals reviewed.       Assessment & Plan:  Pure hypercholesterolemia - Plan: CBC with Differential/Platelet, COMPLETE METABOLIC PANEL WITH GFR, Lipid panel  Benign essential HTN - Plan: CBC with Differential/Platelet, COMPLETE METABOLIC PANEL WITH GFR, Lipid panel  History of prostate cancer - Plan: PSA   Patient's blood pressure today is excellent.  He does have mild bradycardia however at the present time he is asymptomatic and prefers to stay on the atenolol.  I did recommend a CBC, CMP, and a fasting lipid panel.  Ideally I would like to see his LDL cholesterol below 100.  If not below 100 I would recommend a statin even despite being over 30 years of age.  Strongly recommended Pneumovax 23 and/or Prevnar 13 but he declined.  Also strongly recommended Covid vaccination.  Monitor the management of his prostate cancer by checking a PSA and if there has been a considerable increase, will arrange follow-up with urology.

## 2019-09-03 LAB — CBC WITH DIFFERENTIAL/PLATELET
Absolute Monocytes: 964 cells/uL — ABNORMAL HIGH (ref 200–950)
Basophils Absolute: 63 cells/uL (ref 0–200)
Basophils Relative: 0.8 %
Eosinophils Absolute: 111 cells/uL (ref 15–500)
Eosinophils Relative: 1.4 %
HCT: 43.4 % (ref 38.5–50.0)
Hemoglobin: 15.2 g/dL (ref 13.2–17.1)
Lymphs Abs: 1667 cells/uL (ref 850–3900)
MCH: 32.6 pg (ref 27.0–33.0)
MCHC: 35 g/dL (ref 32.0–36.0)
MCV: 93.1 fL (ref 80.0–100.0)
MPV: 11 fL (ref 7.5–12.5)
Monocytes Relative: 12.2 %
Neutro Abs: 5096 cells/uL (ref 1500–7800)
Neutrophils Relative %: 64.5 %
Platelets: 244 10*3/uL (ref 140–400)
RBC: 4.66 10*6/uL (ref 4.20–5.80)
RDW: 13.6 % (ref 11.0–15.0)
Total Lymphocyte: 21.1 %
WBC: 7.9 10*3/uL (ref 3.8–10.8)

## 2019-09-03 LAB — COMPLETE METABOLIC PANEL WITH GFR
AG Ratio: 1.6 (calc) (ref 1.0–2.5)
ALT: 65 U/L — ABNORMAL HIGH (ref 9–46)
AST: 45 U/L — ABNORMAL HIGH (ref 10–35)
Albumin: 4.5 g/dL (ref 3.6–5.1)
Alkaline phosphatase (APISO): 72 U/L (ref 35–144)
BUN/Creatinine Ratio: 17 (calc) (ref 6–22)
BUN: 21 mg/dL (ref 7–25)
CO2: 25 mmol/L (ref 20–32)
Calcium: 9.5 mg/dL (ref 8.6–10.3)
Chloride: 103 mmol/L (ref 98–110)
Creat: 1.23 mg/dL — ABNORMAL HIGH (ref 0.70–1.18)
GFR, Est African American: 65 mL/min/{1.73_m2} (ref >=60)
GFR, Est Non African American: 56 mL/min/{1.73_m2} — ABNORMAL LOW (ref >=60)
Globulin: 2.8 g/dL (calc) (ref 1.9–3.7)
Glucose, Bld: 77 mg/dL (ref 65–99)
Potassium: 5.1 mmol/L (ref 3.5–5.3)
Sodium: 138 mmol/L (ref 135–146)
Total Bilirubin: 0.9 mg/dL (ref 0.2–1.2)
Total Protein: 7.3 g/dL (ref 6.1–8.1)

## 2019-09-03 LAB — PSA: PSA: 2.7 ng/mL (ref <=4.0)

## 2019-09-03 LAB — LIPID PANEL
Cholesterol: 225 mg/dL — ABNORMAL HIGH (ref <200)
HDL: 43 mg/dL (ref >=40)
LDL Cholesterol (Calc): 151 mg/dL (calc) — ABNORMAL HIGH
Non-HDL Cholesterol (Calc): 182 mg/dL (calc) — ABNORMAL HIGH (ref <130)
Total CHOL/HDL Ratio: 5.2 (calc) — ABNORMAL HIGH (ref <5.0)
Triglycerides: 176 mg/dL — ABNORMAL HIGH (ref <150)

## 2019-09-06 ENCOUNTER — Other Ambulatory Visit: Payer: Self-pay | Admitting: Family Medicine

## 2019-09-06 ENCOUNTER — Encounter: Payer: Self-pay | Admitting: Family Medicine

## 2019-09-06 ENCOUNTER — Telehealth: Payer: Self-pay | Admitting: Family Medicine

## 2019-09-06 DIAGNOSIS — E785 Hyperlipidemia, unspecified: Secondary | ICD-10-CM | POA: Insufficient documentation

## 2019-09-06 MED ORDER — ATENOLOL 25 MG PO TABS: 25.0000 mg | ORAL_TABLET | Freq: Every day | ORAL | 3 refills | Status: DC

## 2019-09-06 MED ORDER — ATORVASTATIN CALCIUM 20 MG PO TABS: 20.0000 mg | ORAL_TABLET | Freq: Every day | ORAL | 1 refills | Status: DC

## 2019-09-06 MED ORDER — LOSARTAN POTASSIUM 100 MG PO TABS: 100.0000 mg | ORAL_TABLET | Freq: Every day | ORAL | 3 refills | Status: DC

## 2019-09-06 NOTE — Telephone Encounter (Signed)
Patient called requesting a refill on his losartan and atorvastatin called into [facility]. He is out of medication as of today.  CB# [phone]

## 2019-09-06 NOTE — Telephone Encounter (Signed)
Refills sent to patient's pharmacy. Patient notified

## 2019-10-04 ENCOUNTER — Telehealth: Payer: Self-pay | Admitting: Family Medicine

## 2019-10-04 ENCOUNTER — Ambulatory Visit (INDEPENDENT_AMBULATORY_CARE_PROVIDER_SITE_OTHER): Payer: Medicare Other | Admitting: Nurse Practitioner

## 2019-10-04 ENCOUNTER — Other Ambulatory Visit: Payer: Self-pay

## 2019-10-04 VITALS — BP 152/82 | HR 51 | Temp 98.0°F | Resp 18 | Ht 69.5 in | Wt 255.8 lb

## 2019-10-04 DIAGNOSIS — L239 Allergic contact dermatitis, unspecified cause: Secondary | ICD-10-CM | POA: Diagnosis not present

## 2019-10-04 MED ORDER — TRIAMCINOLONE ACETONIDE 0.1 % EX CREA: 1.0000 "application " | TOPICAL_CREAM | Freq: Two times a day (BID) | CUTANEOUS | 0 refills | Status: DC

## 2019-10-04 NOTE — Progress Notes (Signed)
Acute Office Visit  Subjective:    Patient ID: DELVANTE [name], male    DOB: 06-15-1941, 79 y.o.   MRN: [id number]  Chief Complaint  Patient presents with  . Rash    back and neck, tingling around ears, started x1 week, thinks its lipitor related, stopped lipitor but nothing has changed    HPI [name] is a 79 year old male presenting to the clinic for evaluation of a rash to top of back to hairline and front chest area. The sxs started approx 3 weeks ago he thinks is one week after starting Lipitor. The rash began as a raised erythremic small bumps that where pruritic. He used an old steroid cream that help to decrease his symptoms. No allergic contacts or others with similar sxs. No cp/ct, gu/gi sxs, pain, sob, or edema.  Past Medical History:  Diagnosis Date  . Brain tumor ([place])   . Cancer [facility])    prostate s/p cryoablation at [facility] 2/13, Gleason 7 of left lateral base  . HLD (hyperlipidemia)   . Hypertension     Past Surgical History:  Procedure Laterality Date  . APPENDECTOMY    . BRAIN SURGERY      No family history on file.  Social History   Socioeconomic History  . Marital status: Married    Spouse name: Not on file  . Number of children: Not on file  . Years of education: Not on file  . Highest education level: Not on file  Occupational History  . Not on file  Tobacco Use  . Smoking status: Former Research scientist (life sciences)  . Smokeless tobacco: Never Used  Substance and Sexual Activity  . Alcohol use: No    Alcohol/week: 0.0 standard drinks  . Drug use: No  . Sexual activity: Not Currently  Other Topics Concern  . Not on file  Social History Narrative  . Not on file   Social Determinants of Health   Financial Resource Strain:   . Difficulty of Paying Living Expenses:   Food Insecurity:   . Worried About Charity fundraiser in the Last Year:   . Arboriculturist in the Last Year:   Transportation Needs:   . Film/video editor (Medical):   Marland Kitchen Lack of  Transportation (Non-Medical):   Physical Activity:   . Days of Exercise per Week:   . Minutes of Exercise per Session:   Stress:   . Feeling of Stress :   Social Connections:   . Frequency of Communication with Friends and Family:   . Frequency of Social Gatherings with Friends and Family:   . Attends Religious Services:   . Active Member of Clubs or Organizations:   . Attends Archivist Meetings:   Marland Kitchen Marital Status:   Intimate Partner Violence:   . Fear of Current or Ex-Partner:   . Emotionally Abused:   Marland Kitchen Physically Abused:   . Sexually Abused:     Outpatient Medications Prior to Visit  Medication Sig Dispense Refill  . atenolol (TENORMIN) 25 MG tablet Take 1 tablet (25 mg total) by mouth daily. 90 tablet 3  . losartan (COZAAR) 100 MG tablet Take 1 tablet (100 mg total) by mouth daily. 90 tablet 3  . atorvastatin (LIPITOR) 20 MG tablet Take 1 tablet (20 mg total) by mouth daily. (Patient not taking: Reported on 10/04/2019) 90 tablet 1   No facility-administered medications prior to visit.    Allergies  Allergen Reactions  . Amlodipine  Leg swelling  . Red Dye Hives    Review of Systems  All other systems reviewed and are negative.      Objective:    Physical Exam Vitals and nursing note reviewed.  Constitutional:      Appearance: Normal appearance.  HENT:     Head: Normocephalic.     Nose: Nose normal.     Mouth/Throat:     Mouth: Mucous membranes are moist.     Pharynx: Oropharynx is clear.  Cardiovascular:     Rate and Rhythm: Normal rate.  Pulmonary:     Effort: Pulmonary effort is normal.  Musculoskeletal:     Cervical back: Normal range of motion and neck supple.  Skin:    General: Skin is warm and dry.     Capillary Refill: Capillary refill takes less than 2 seconds.          Comments: Near healed scabbed small areas mildly erythremic no edema   Neurological:     General: No focal deficit present.     Mental Status: He is alert and  oriented to person, place, and time.     BP (!) 152/82 (BP Location: Left Arm, Patient Position: Sitting, Cuff Size: Large)   Pulse (!) 51   Temp 98 F (36.7 C) (Temporal)   Resp 18   Ht 5' 9.5" (1.765 m)   Wt 255 lb 12.8 oz (116 kg)   SpO2 99%   BMI 37.23 kg/m  Wt Readings from Last 3 Encounters:  10/04/19 255 lb 12.8 oz (116 kg)  09/02/19 257 lb (116.6 kg)  02/22/18 246 lb (111.6 kg)    Health Maintenance Due  Topic Date Due  . TETANUS/TDAP  Never done  . PNA vac Low Risk Adult (1 of 2 - PCV13) Never done    There are no preventive care reminders to display for this patient.   No results found for: TSH Lab Results  Component Value Date   WBC 7.9 09/02/2019   HGB 15.2 09/02/2019   HCT 43.4 09/02/2019   MCV 93.1 09/02/2019   PLT 244 09/02/2019   Lab Results  Component Value Date   NA 138 09/02/2019   K 5.1 09/02/2019   CO2 25 09/02/2019   GLUCOSE 77 09/02/2019   BUN 21 09/02/2019   CREATININE 1.23 (H) 09/02/2019   BILITOT 0.9 09/02/2019   ALKPHOS 67 10/08/2015   AST 45 (H) 09/02/2019   ALT 65 (H) 09/02/2019   PROT 7.3 09/02/2019   ALBUMIN 4.2 10/08/2015   CALCIUM 9.5 09/02/2019   Lab Results  Component Value Date   CHOL 225 (H) 09/02/2019   Lab Results  Component Value Date   HDL 43 09/02/2019   Lab Results  Component Value Date   LDLCALC 151 (H) 09/02/2019   Lab Results  Component Value Date   TRIG 176 (H) 09/02/2019   Lab Results  Component Value Date   CHOLHDL 5.2 (H) 09/02/2019   No results found for: HGBA1C     Assessment & Plan:  Your examination and assessment is 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 You already stopped taking this medication and it has been added to allergy list. I have notified your PCP of allergy and to evaluate alternative cholesterol lowering agents for you.  I have Refilled steroid cream as you had old script that has also ran out.  Your reaction appears to be resolving and no  other additional treatment for allergy is indicated at this  time however please follow up for worsening or non resolving sxs.  Education printed for [employer]. Problem List Items Addressed This Visit    None    Visit Diagnoses    Allergic dermatitis    -  Primary   Relevant Medications   triamcinolone cream (KENALOG) 0.1 %      Meds ordered this encounter  Medications  . triamcinolone cream (KENALOG) 0.1 %    Sig: Apply 1 application topically 2 (two) times daily.    Dispense:  80 g    Refill:  0   Follow Up: for worsening or non resolving sxs/  Annie Main, FNP

## 2019-10-04 NOTE — Telephone Encounter (Signed)
Pt was seen by Crystal today and he has developed an allergy to Lipitor and Crystal wanted to let you know so that you may want to place him on an alternative.

## 2019-10-04 NOTE — Telephone Encounter (Signed)
Stop lipitor.  If symptoms improve, I would try replacing with pravastatin 20 mg poqday

## 2019-10-12 NOTE — Telephone Encounter (Signed)
Pt called back and he actually restarted the Lipitor with no rash. If he break out again he will call.

## 2019-10-12 NOTE — Telephone Encounter (Signed)
LMTRC

## 2019-11-01 ENCOUNTER — Other Ambulatory Visit: Payer: Self-pay

## 2019-11-01 ENCOUNTER — Ambulatory Visit (INDEPENDENT_AMBULATORY_CARE_PROVIDER_SITE_OTHER): Payer: Medicare Other | Admitting: Family Medicine

## 2019-11-01 ENCOUNTER — Encounter: Payer: Self-pay | Admitting: Family Medicine

## 2019-11-01 ENCOUNTER — Ambulatory Visit: Payer: Medicare Other | Admitting: Family Medicine

## 2019-11-01 VITALS — BP 110/74 | HR 52 | Temp 97.3°F | Resp 18 | Ht 69.5 in | Wt 251.0 lb

## 2019-11-01 DIAGNOSIS — L239 Allergic contact dermatitis, unspecified cause: Secondary | ICD-10-CM

## 2019-11-01 MED ORDER — DOXYCYCLINE HYCLATE 100 MG PO TABS: 100.0000 mg | ORAL_TABLET | Freq: Two times a day (BID) | ORAL | 0 refills | Status: DC

## 2019-11-01 MED ORDER — METHYLPREDNISOLONE ACETATE 40 MG/ML IJ SUSP
60.0000 mg | Freq: Once | INTRAMUSCULAR | Status: AC
  Administered 2019-11-01: 60 mg via INTRAMUSCULAR

## 2019-11-01 MED ORDER — PREDNISONE 20 MG PO TABS: ORAL_TABLET | ORAL | 0 refills | Status: DC

## 2019-11-01 NOTE — Progress Notes (Signed)
Subjective:    Patient ID: [name], male    DOB: 31-Aug-1940, 79 y.o.   MRN: [id number]     Earlier this week, the patient was helping a friend install a fence in the field.  He was being bitten by a horse flies.  His friend found numerous ticks on him.  Yesterday evening, the patient looked in the affected area as shown above and discovered large hive-like welts developing as shown above.  There is also a similar welt on his belt line on his left side.  The patches are extremely erythematous and itchy.  He denies any fever or chills or body ache or neck stiffness or headaches.  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  Perfumes and detergents have irritated him in a similar way in the past.  However the patient is concerned that one of the areas may have had a tick. Current Outpatient Medications on File Prior to Visit  Medication Sig Dispense Refill  . atenolol (TENORMIN) 25 MG tablet Take 1 tablet (25 mg total) by mouth daily. 90 tablet 3  . losartan (COZAAR) 100 MG tablet Take 1 tablet (100 mg total) by mouth daily. 90 tablet 3  . triamcinolone cream (KENALOG) 0.1 % Apply 1 application topically 2 (two) times daily. 80 g 0   No current facility-administered medications on file prior to visit.    Allergies  Allergen Reactions  . Amlodipine     Leg swelling  . Red Dye Hives   Past Medical History:  Diagnosis Date  . Brain tumor ([place])   . 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 HLD (hyperlipidemia)   . Hypertension    Past Surgical History:  Procedure Laterality Date  . APPENDECTOMY    . BRAIN SURGERY      Social History   Socioeconomic History  . Marital status: Married    Spouse name: Not on file  . Number of children: Not on file  . Years of education: Not on file  . Highest education level: Not on file  Occupational History  . Not on file  Tobacco  Use  . Smoking status: Former Research scientist (life sciences)  . Smokeless tobacco: Never Used  Substance and Sexual Activity  . Alcohol use: No    Alcohol/week: 0.0 standard drinks  . Drug use: No  . Sexual activity: Not Currently  Other Topics Concern  . Not on file  Social History Narrative  . Not on file   Social Determinants of Health   Financial Resource Strain:   . Difficulty of Paying Living Expenses:   Food Insecurity:   . Worried About Charity fundraiser in the Last Year:   . Arboriculturist in the Last Year:   Transportation Needs:   . Film/video editor (Medical):   Marland Kitchen Lack of Transportation (Non-Medical):   Physical Activity:   . Days of Exercise per Week:   . Minutes of Exercise per Session:   Stress:   . Feeling of Stress :   Social Connections:   . Frequency of Communication with Friends and Family:   . Frequency of Social Gatherings with Friends and Family:   . Attends Religious Services:   . Active Member of Clubs or Organizations:   . Attends Archivist Meetings:   Marland Kitchen Marital Status:   Intimate Partner Violence:   . Fear  of Current or Ex-Partner:   . Emotionally Abused:   Marland Kitchen Physically Abused:   . Sexually Abused:       Review of Systems  All other systems reviewed and are negative.      Objective:   Physical Exam  Constitutional: He appears well-developed and well-nourished.  HENT:  Right Ear: External ear normal.  Left Ear: External ear normal.  Nose: Nose normal.  Mouth/Throat: Oropharynx is clear and moist. No oropharyngeal exudate.  Eyes: Pupils are equal, round, and reactive to light. Conjunctivae are normal. Right eye exhibits no discharge. Left eye exhibits no discharge.  Neck: No JVD present. No thyromegaly present.  Cardiovascular: Normal rate, regular rhythm and normal heart sounds. Exam reveals no gallop and no friction rub.  No murmur heard. Pulmonary/Chest: Effort normal and breath sounds normal. No respiratory distress. He has no  wheezes. He has no rales.  Abdominal: Soft. Bowel sounds are normal. He exhibits no distension. There is no abdominal tenderness. There is no rebound and no guarding.  Musculoskeletal:        General: Edema present.     Cervical back: Neck supple.  Lymphadenopathy:    He has no cervical adenopathy.  Skin: Rash noted. There is erythema.  Vitals reviewed.       Assessment & Plan:  Allergic dermatitis - Plan: methylPREDNISolone acetate (DEPO-MEDROL) injection 60 mg   Rash appears to be welts/urticaria to some type of allergic reaction.  Begin Depo-Medrol 60 mg IM x1 and start prednisone taper pack tomorrow.  Use Benadryl 25 mg every 8 hours.  If the patient develops a central clearing, I would be concerned about erythema migrans/Lyme disease and I will treat the patient with doxycycline.  We discussed this at length today.  We will not start the doxycycline unless he see central clearing in the rash which would constitute disseminated erythema migrans

## 2020-01-27 ENCOUNTER — Other Ambulatory Visit: Payer: Self-pay

## 2020-01-27 ENCOUNTER — Ambulatory Visit (INDEPENDENT_AMBULATORY_CARE_PROVIDER_SITE_OTHER): Payer: Medicare Other | Admitting: Family Medicine

## 2020-01-27 VITALS — BP 140/70 | HR 61 | Temp 98.1°F | Ht 69.0 in | Wt 248.0 lb

## 2020-01-27 DIAGNOSIS — B349 Viral infection, unspecified: Secondary | ICD-10-CM

## 2020-01-27 DIAGNOSIS — T675XXA Heat exhaustion, unspecified, initial encounter: Secondary | ICD-10-CM

## 2020-01-27 NOTE — Progress Notes (Signed)
Subjective:    Patient ID: [name], male    DOB: [birth year], 79 y.o.   MRN: [id number]  HPI  Patient states that Monday, he was working in his yard.  He states that he was sweating profusely.  He became extremely dizzy and weak.  He states that he was so weak he had to pull himself up holding onto the wall because his legs felt so weak.  He went inside and started drinking copious amounts of water and Gatorade as he was concerned that he may have heat exhaustion.  Later that afternoon he developed chills.  He also developed diarrhea.  He stated for the next 2 days he had watery diarrhea.  He would have bowel movements 2-3 times a day that would be just pure water.  He denied any abdominal pain.  He denied any hematochezia.  However he did lose approximately 6 pounds over the week.  He denied any nausea or vomiting.  He has had his Covid vaccination.  He has had a postnasal drip cough but he denies any chest pain or shortness of breath.  He states that he is feeling better today.  He denies any fever.  He denies any chills.  He denies any chest pain or shortness of breath.  He denies any abdominal pain.  He denies any rash.  He denies any dysuria or arthralgias although he does still complain of feeling tired. Past Medical History:  Diagnosis Date  . Brain tumor ([place])   . 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 HLD (hyperlipidemia)   . Hypertension    Past Surgical History:  Procedure Laterality Date  . APPENDECTOMY    . BRAIN SURGERY     Current Outpatient Medications on File Prior to Visit  Medication Sig Dispense Refill  . atenolol (TENORMIN) 25 MG tablet Take 1 tablet (25 mg total) by mouth daily. 90 tablet 3  . doxycycline (VIBRA-TABS) 100 MG tablet Take 1 tablet (100 mg total) by mouth 2 (two) times daily. 42 tablet 0  . losartan (COZAAR) 100 MG tablet Take 1 tablet (100 mg total) by mouth daily. 90 tablet 3  .  triamcinolone cream (KENALOG) 0.1 % Apply 1 application topically 2 (two) times daily. 80 g 0   No current facility-administered medications on file prior to visit.   Allergies  Allergen Reactions  . Amlodipine     Leg swelling  . Red Dye Hives   Social History   Socioeconomic History  . Marital status: Married    Spouse name: Not on file  . Number of children: Not on file  . Years of education: Not on file  . Highest education level: Not on file  Occupational History  . Not on file  Tobacco Use  . Smoking status: Former Research scientist (life sciences)  . Smokeless tobacco: Never Used  Substance and Sexual Activity  . Alcohol use: No    Alcohol/week: 0.0 standard drinks  . Drug use: No  . Sexual activity: Not Currently  Other Topics Concern  . Not on file  Social History Narrative  . Not on file   Social Determinants of Health   Financial Resource Strain:   . Difficulty of Paying Living Expenses:   Food Insecurity:   . Worried About Charity fundraiser in the Last Year:   . Arboriculturist in the Last Year:   Transportation Needs:   . Lack  of Transportation (Medical):   Marland Kitchen Lack of Transportation (Non-Medical):   Physical Activity:   . Days of Exercise per Week:   . Minutes of Exercise per Session:   Stress:   . Feeling of Stress :   Social Connections:   . Frequency of Communication with Friends and Family:   . Frequency of Social Gatherings with Friends and Family:   . Attends Religious Services:   . Active Member of Clubs or Organizations:   . Attends Archivist Meetings:   Marland Kitchen Marital Status:   Intimate Partner Violence:   . Fear of Current or Ex-Partner:   . Emotionally Abused:   Marland Kitchen Physically Abused:   . Sexually Abused:       Review of Systems  All other systems reviewed and are negative.      Objective:   Physical Exam Constitutional:      General: He is not in acute distress.    Appearance: Normal appearance. He is normal weight. He is not ill-appearing  or toxic-appearing.  HENT:     Right Ear: Tympanic membrane and ear canal normal.     Left Ear: Tympanic membrane and ear canal normal.     Nose: Nose normal. No congestion or rhinorrhea.     Mouth/Throat:     Mouth: Mucous membranes are moist.     Pharynx: No oropharyngeal exudate or posterior oropharyngeal erythema.  Eyes:     Conjunctiva/sclera: Conjunctivae normal.  Cardiovascular:     Rate and Rhythm: Normal rate and regular rhythm.     Heart sounds: Normal heart sounds. No murmur heard.  No friction rub. No gallop.   Pulmonary:     Effort: Pulmonary effort is normal.     Breath sounds: Normal breath sounds. No wheezing, rhonchi or rales.  Abdominal:     General: Bowel sounds are normal. There is no distension.     Palpations: Abdomen is soft.     Tenderness: There is no abdominal tenderness. There is no guarding or rebound.  Musculoskeletal:     Cervical back: Neck supple.  Lymphadenopathy:     Cervical: No cervical adenopathy.  Skin:    Findings: No rash.  Neurological:     Mental Status: He is alert.           Assessment & Plan:  Viral syndrome  Heat exhaustion, initial encounter  I believe the patient likely had heat exhaustion coupled with dehydration and possibly a viral syndrome as well given the diarrhea and the occasional nonproductive cough.  However he is clinically feeling better at this time.  Offer the patient a Covid test however he has had his Covid vaccination and he declined the Covid test.  I offered him some screening lab work including a CBC and a CMP but he states that he feels better and he will hold off on the blood work at this time.  He will notify me next week if he does not continue to improve.  I recommended rest and rehydration over the weekend.  If symptoms or not completely better by Monday he is to let me know.

## 2020-01-31 ENCOUNTER — Ambulatory Visit (INDEPENDENT_AMBULATORY_CARE_PROVIDER_SITE_OTHER): Payer: Medicare Other | Admitting: Family Medicine

## 2020-01-31 ENCOUNTER — Other Ambulatory Visit: Payer: Self-pay

## 2020-01-31 VITALS — BP 130/70 | HR 54 | Temp 96.5°F | Ht 69.0 in | Wt 248.0 lb

## 2020-01-31 DIAGNOSIS — R059 Cough, unspecified: Secondary | ICD-10-CM

## 2020-01-31 DIAGNOSIS — R05 Cough: Secondary | ICD-10-CM

## 2020-01-31 MED ORDER — AZITHROMYCIN 250 MG PO TABS: ORAL_TABLET | ORAL | 0 refills | Status: DC

## 2020-01-31 NOTE — Progress Notes (Signed)
Subjective:    Patient ID: [name], male    DOB: [birth year], 79 y.o.   MRN: [id number]  HPI 01/27/20 Patient states that Monday, he was working in his yard.  He states that he was sweating profusely.  He became extremely dizzy and weak.  He states that he was so weak he had to pull himself up holding onto the wall because his legs felt so weak.  He went inside and started drinking copious amounts of water and Gatorade as he was concerned that he may have heat exhaustion.  Later that afternoon he developed chills.  He also developed diarrhea.  He stated for the next 2 days he had watery diarrhea.  He would have bowel movements 2-3 times a day that would be just pure water.  He denied any abdominal pain.  He denied any hematochezia.  However he did lose approximately 6 pounds over the week.  He denied any nausea or vomiting.  He has had his Covid vaccination.  He has had a postnasal drip cough but he denies any chest pain or shortness of breath.  He states that he is feeling better today.  He denies any fever.  He denies any chills.  He denies any chest pain or shortness of breath.  He denies any abdominal pain.  He denies any rash.  He denies any dysuria or arthralgias although he does still complain of feeling tired.  At that time, my plan was: I believe the patient likely had heat exhaustion coupled with dehydration and possibly a viral syndrome as well given the diarrhea and the occasional nonproductive cough.  However he is clinically feeling better at this time.  Offer the patient a Covid test however he has had his Covid vaccination and he declined the Covid test.  I offered him some screening lab work including a CBC and a CMP but he states that he feels better and he will hold off on the blood work at this time.  He will notify me next week if he does not continue to improve.  I recommended rest and rehydration over the weekend.  If symptoms or not completely better by Monday he is to let me  know.  01/31/20 Since I last saw the patient, his cough has worsened.  He states that if he takes a deep breath he will start coughing.  He will also start coughing for no reason.  He reports chest congestion.  On exam today he has rails in his left lower lobe.  He denies any pleurisy or chest pain however he continues to endorse feeling tired.  He denies any fevers or chills however.  He denies any pleurisy or hemoptysis.  Diarrhea has improved.  However he is concerned because his cough is worsening. Past Medical History:  Diagnosis Date  . Brain tumor ([place])   . 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 HLD (hyperlipidemia)   . Hypertension    Past Surgical History:  Procedure Laterality Date  . APPENDECTOMY    . BRAIN SURGERY     Current Outpatient Medications on File Prior to Visit  Medication Sig Dispense Refill  . atenolol (TENORMIN) 25 MG tablet Take 1 tablet (25 mg total) by mouth daily. 90 tablet 3  . doxycycline (VIBRA-TABS) 100 MG tablet Take 1 tablet (100 mg total) by mouth 2 (two) times daily. 42 tablet 0  . losartan (COZAAR) 100 MG tablet  Take 1 tablet (100 mg total) by mouth daily. 90 tablet 3  . triamcinolone cream (KENALOG) 0.1 % Apply 1 application topically 2 (two) times daily. 80 g 0   No current facility-administered medications on file prior to visit.   Allergies  Allergen Reactions  . Amlodipine     Leg swelling  . Red Dye Hives   Social History   Socioeconomic History  . Marital status: Married    Spouse name: Not on file  . Number of children: Not on file  . Years of education: Not on file  . Highest education level: Not on file  Occupational History  . Not on file  Tobacco Use  . Smoking status: Former Research scientist (life sciences)  . Smokeless tobacco: Never Used  Substance and Sexual Activity  . Alcohol use: No    Alcohol/week: 0.0 standard drinks  . Drug use: No  . Sexual activity: Not Currently  Other Topics  Concern  . Not on file  Social History Narrative  . Not on file   Social Determinants of Health   Financial Resource Strain:   . Difficulty of Paying Living Expenses:   Food Insecurity:   . Worried About Charity fundraiser in the Last Year:   . Arboriculturist in the Last Year:   Transportation Needs:   . Film/video editor (Medical):   Marland Kitchen Lack of Transportation (Non-Medical):   Physical Activity:   . Days of Exercise per Week:   . Minutes of Exercise per Session:   Stress:   . Feeling of Stress :   Social Connections:   . Frequency of Communication with Friends and Family:   . Frequency of Social Gatherings with Friends and Family:   . Attends Religious Services:   . Active Member of Clubs or Organizations:   . Attends Archivist Meetings:   Marland Kitchen Marital Status:   Intimate Partner Violence:   . Fear of Current or Ex-Partner:   . Emotionally Abused:   Marland Kitchen Physically Abused:   . Sexually Abused:       Review of Systems  All other systems reviewed and are negative.      Objective:   Physical Exam Constitutional:      General: He is not in acute distress.    Appearance: Normal appearance. He is normal weight. He is not ill-appearing or toxic-appearing.  HENT:     Right Ear: Tympanic membrane and ear canal normal.     Left Ear: Tympanic membrane and ear canal normal.     Nose: Nose normal. No congestion or rhinorrhea.     Mouth/Throat:     Mouth: Mucous membranes are moist.     Pharynx: No oropharyngeal exudate or posterior oropharyngeal erythema.  Eyes:     Conjunctiva/sclera: Conjunctivae normal.  Cardiovascular:     Rate and Rhythm: Normal rate and regular rhythm.     Heart sounds: Normal heart sounds. No murmur heard.  No friction rub. No gallop.   Pulmonary:     Effort: Pulmonary effort is normal.     Breath sounds: Examination of the left-lower field reveals rales. Rales present. No wheezing or rhonchi.  Abdominal:     General: Bowel sounds  are normal. There is no distension.     Palpations: Abdomen is soft.     Tenderness: There is no abdominal tenderness. There is no guarding or rebound.  Musculoskeletal:     Cervical back: Neck supple.  Lymphadenopathy:     Cervical:  No cervical adenopathy.  Skin:    Findings: No rash.  Neurological:     Mental Status: He is alert.           Assessment & Plan:  Cough - Plan: SARS-COV-2 RNA,(COVID-19) QUAL NAAT  Due to the rales in his left lower lung, I am concerned about possible early community-acquired pneumonia.  I will treat the patient with a Z-Pak to cover atypical infections as well as Streptococcus.  Meanwhile I will screen the patient for Covid given the symptoms that he had prior that sounded like a vague viral infection.  Patient is instructed to quarantine until Covid test returns but begin the Z-Pak immediately.  Reassess in 48 hours or sooner if worse

## 2020-02-02 LAB — SARS-COV-2 RNA,(COVID-19) QUALITATIVE NAAT: SARS CoV2 RNA: DETECTED — CR

## 2020-07-09 ENCOUNTER — Telehealth: Payer: Self-pay

## 2020-07-09 ENCOUNTER — Telehealth: Payer: Self-pay | Admitting: Family Medicine

## 2020-07-09 NOTE — Telephone Encounter (Signed)
[name] some how has put strain on his right knee. When he lays down it bothers him, and while walking, patient asked could he have a muscle relaxer sent it?

## 2020-07-09 NOTE — Telephone Encounter (Signed)
Patient needs to be scheduled for appt

## 2020-07-09 NOTE — Telephone Encounter (Signed)
Having right hip knee pain would like to see if Dr.Pickard prescribe medication for his pain

## 2020-07-10 ENCOUNTER — Other Ambulatory Visit: Payer: Self-pay | Admitting: Family Medicine

## 2020-07-10 ENCOUNTER — Telehealth: Payer: Self-pay

## 2020-07-10 MED ORDER — TIZANIDINE HCL 4 MG PO CAPS
4.0000 mg | ORAL_CAPSULE | Freq: Three times a day (TID) | ORAL | 0 refills | Status: DC | PRN
Start: 2020-07-10 — End: 2020-12-03

## 2020-07-10 NOTE — Telephone Encounter (Signed)
Call patient informed him Provider sent rx, and if not any better NTBS.

## 2020-07-10 NOTE — Telephone Encounter (Signed)
Muscle relaxer will help with muscle pain, not sure it would do much for knee pain but I will send in muscle relaxer.  NTBS if no better

## 2020-07-10 NOTE — Telephone Encounter (Signed)
Called patient made aware rx sent to pharmacy, and if no better NTBS

## 2020-07-12 ENCOUNTER — Telehealth: Payer: Self-pay | Admitting: *Deleted

## 2020-07-12 NOTE — Telephone Encounter (Signed)
Could come in for evaluation and cortisone shot possibly.

## 2020-07-12 NOTE — Telephone Encounter (Signed)
Pt says he is willing to come in for cortisone shot. Scheduled for 07/13/20 @1130 

## 2020-07-12 NOTE — Telephone Encounter (Signed)
Received call from patient.   Reports that he has been taking Zanaflex for R Knee pain, but he has no relief. Requested MD to please advise.

## 2020-07-13 ENCOUNTER — Other Ambulatory Visit: Payer: Self-pay

## 2020-07-13 ENCOUNTER — Encounter: Payer: Self-pay | Admitting: Family Medicine

## 2020-07-13 ENCOUNTER — Ambulatory Visit (INDEPENDENT_AMBULATORY_CARE_PROVIDER_SITE_OTHER): Payer: Medicare Other | Admitting: Family Medicine

## 2020-07-13 VITALS — HR 55

## 2020-07-13 DIAGNOSIS — M25561 Pain in right knee: Secondary | ICD-10-CM

## 2020-07-13 NOTE — Progress Notes (Signed)
Subjective:    Patient ID: [name], male    DOB: [birth year], 80 y.o.   MRN: [id number]  Patient states that 1 week ago, he tweaked his right lower back bending over and twisting.  That has gotten slightly better however he has developed severe aching pain in his right knee.  The pain is located over the lateral joint line.  It hurts to bend.  It hurts to stand.  It hurts to walk.  He denies any laxity to varus or valgus stress.  It is not buckling or giving underneath him.  However it just aches and throbs constantly.  He has a negative anterior and posterior drawer sign today.  There is no evidence of an MCL or LCL tear.  I am unable to reproduce the pain with meniscal grind/Apley grind.  Current Outpatient Medications on File Prior to Visit  Medication Sig Dispense Refill  . atenolol (TENORMIN) 25 MG tablet Take 1 tablet (25 mg total) by mouth daily. 90 tablet 3  . azithromycin (ZITHROMAX) 250 MG tablet 2 tabs poqday1, 1 tab poqday 2-5 6 tablet 0  . doxycycline (VIBRA-TABS) 100 MG tablet Take 1 tablet (100 mg total) by mouth 2 (two) times daily. 42 tablet 0  . losartan (COZAAR) 100 MG tablet Take 1 tablet (100 mg total) by mouth daily. 90 tablet 3  . tiZANidine (ZANAFLEX) 4 MG capsule Take 1 capsule (4 mg total) by mouth 3 (three) times daily as needed for muscle spasms. 30 capsule 0  . triamcinolone cream (KENALOG) 0.1 % Apply 1 application topically 2 (two) times daily. 80 g 0   No current facility-administered medications on file prior to visit.    Allergies  Allergen Reactions  . Amlodipine     Leg swelling  . Red Dye Hives   Past Medical History:  Diagnosis Date  . Brain tumor ([place])   . 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 HLD (hyperlipidemia)   . Hypertension    Past Surgical History:  Procedure Laterality Date  . APPENDECTOMY    . BRAIN SURGERY      Social History   Socioeconomic History  . Marital  status: Married    Spouse name: Not on file  . Number of children: Not on file  . Years of education: Not on file  . Highest education level: Not on file  Occupational History  . Not on file  Tobacco Use  . Smoking status: Former Research scientist (life sciences)  . Smokeless tobacco: Never Used  Substance and Sexual Activity  . Alcohol use: No    Alcohol/week: 0.0 standard drinks  . Drug use: No  . Sexual activity: Not Currently  Other Topics Concern  . Not on file  Social History Narrative  . Not on file   Social Determinants of Health   Financial Resource Strain: Not on file  Food Insecurity: Not on file  Transportation Needs: Not on file  Physical Activity: Not on file  Stress: Not on file  Social Connections: Not on file  Intimate Partner Violence: Not on file      Review of Systems  All other systems reviewed and are negative.      Objective:   Physical Exam Vitals reviewed.  Constitutional:      Appearance: He is well-developed.  Neck:     Thyroid: No thyromegaly.     Vascular: No JVD.  Cardiovascular:     Rate  and Rhythm: Normal rate and regular rhythm.     Heart sounds: Normal heart sounds. No murmur heard. No friction rub. No gallop.   Pulmonary:     Effort: Pulmonary effort is normal. No respiratory distress.     Breath sounds: Normal breath sounds. No wheezing or rales.  Musculoskeletal:     Right knee: No effusion or erythema. Decreased range of motion. Tenderness present over the lateral joint line. No LCL laxity or MCL laxity. Normal alignment and normal meniscus.     Instability Tests: Anterior drawer test negative. Posterior drawer test negative.         Assessment & Plan:  Acute pain of right knee  Symptoms have been present for 1 week.  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  He elects to try cortisone injection due to the pain.  Using sterile technique, I injected the right knee with 2 cc lidocaine, 2 cc  of Marcaine, and 2 cc of 40 mg/mL Kenalog.  He tolerated the procedure well without complication.  Reassess in 1 week if no better or sooner if worse.  Proceed with imaging if symptoms are no better.

## 2020-07-20 ENCOUNTER — Encounter: Payer: Self-pay | Admitting: Family Medicine

## 2020-07-20 ENCOUNTER — Other Ambulatory Visit: Payer: Self-pay | Admitting: Family Medicine

## 2020-07-20 ENCOUNTER — Telehealth: Payer: Self-pay | Admitting: Family Medicine

## 2020-07-20 DIAGNOSIS — M25569 Pain in unspecified knee: Secondary | ICD-10-CM

## 2020-07-20 NOTE — Telephone Encounter (Signed)
I placed referral.

## 2020-07-20 NOTE — Telephone Encounter (Signed)
Spoke with pt to confirm Dr.Pickard send his Sophronia Simas referral in

## 2020-07-20 NOTE — Telephone Encounter (Signed)
Need refill Aimovig

## 2020-07-20 NOTE — Telephone Encounter (Signed)
Forward to PCP.

## 2020-07-20 NOTE — Telephone Encounter (Signed)
No refill need type error

## 2020-07-20 NOTE — Telephone Encounter (Signed)
This encounter was created in error - please disregard.

## 2020-07-20 NOTE — Telephone Encounter (Signed)
Was seen on 07/13/20 for knee pain would like if Dr.Pickard get a referral to see a [place]

## 2020-07-25 ENCOUNTER — Encounter: Payer: Self-pay | Admitting: Family Medicine

## 2020-07-25 ENCOUNTER — Other Ambulatory Visit: Payer: Self-pay

## 2020-07-25 ENCOUNTER — Ambulatory Visit (INDEPENDENT_AMBULATORY_CARE_PROVIDER_SITE_OTHER): Payer: Medicare Other | Admitting: Family Medicine

## 2020-07-25 DIAGNOSIS — M25561 Pain in right knee: Secondary | ICD-10-CM | POA: Diagnosis not present

## 2020-07-25 DIAGNOSIS — M545 Low back pain, unspecified: Secondary | ICD-10-CM | POA: Diagnosis not present

## 2020-07-25 MED ORDER — PREDNISONE 10 MG PO TABS: ORAL_TABLET | ORAL | 0 refills | Status: DC

## 2020-07-25 MED ORDER — TRAMADOL HCL 50 MG PO TABS: 50.0000 mg | ORAL_TABLET | Freq: Four times a day (QID) | ORAL | 0 refills | Status: DC | PRN

## 2020-07-25 MED ORDER — BACLOFEN 10 MG PO TABS: 5.0000 mg | ORAL_TABLET | Freq: Three times a day (TID) | ORAL | 3 refills | Status: DC | PRN

## 2020-07-25 NOTE — Progress Notes (Signed)
Office Visit Note   Patient: [name]           Date of Birth: Dec 10, 1940           MRN: [id number] Visit Date: 07/25/2020 Requested by: Susy Frizzle, MD [address] [place],   [id number] PCP: Susy Frizzle, MD  Subjective: Chief Complaint  Patient presents with  . Right Knee - Pain  . Lower Back - Pain    Knee started hurting on the 7th of this month. NKI. Started one day after having a pain shoot down the lateral - anterior leg to the foot. The thigh continues to hurt - "it's just sore." Has had 2 episodes of the shooting pain.    HPI: 80yo M presenting to clinic with concerns of approximately 3 weeks of significant right leg 'lightening-like' pain. Patient states he isn't sure what he did to cause these symptoms, though he thinks it may have been climbing into his tall truck. He says his pain starts in his lower back, and then travels down his leg, primarily in the anterior and lateral aspect. After the initial electric jolt, he will be left with an aching, cramping pain within the muscles. No weakness, in the leg, but he says he has falling three times since his symptoms started because the pain makes it hard to walk. Pain is also keeping him awake and night and stops him from getting comfortable. He saw his PCM, who was concerned about knee OA, and performed a steroid injection. This has, unfortunately, not helped. No numbness.               ROS:   All other systems were reviewed and are negative.  Objective: Vital Signs: There were no vitals taken for this visit.  Physical Exam:  General:  Alert and oriented, in no acute distress. Pulm:  Breathing unlabored. Psy:  Normal mood, congruent affect. Skin:  Lower back with no bruising, rashes, or erythema.   BACK EXAMINATION: Antalgic Gait, walks stiffly with back held in slight flexion.  Decreased lumbar lordosis.   ROM: Endorses pain both with forward flexion and extension of the lumbar spine.  Does  have reduced internal rotation of right hip, which he states causes some pain, though not to the degree of the pain that provoked this visit.   Palpation: No midline tenderness, no deformity or step-offs. Endorses paraspinal muscle tenderness at approximately level of L4-L5.  Mild tenderness over right SI Joint.   Strength: Hip flexion (L1), Hip Aduction (L2), Knee Extension (L3) are 5/5 Bilaterally Foot Inversion (L4), Dorsiflexion (L5), and Eversion (S1) 5/5 Bilaterally Sensation: Intact to light touch medial and lateral aspects of lower extremities, and lateral, dorsal, and medial aspects of foot.  Seated SLR: No radiation down Ipilateral or contralateral leg bilaterally  Imaging: No results found.  Assessment & Plan: 80yo M presenting to clinic with concerns of acute lower back pain with right sided radiculopathy for approximately 3 weeks. Atraumatic, with no redflag systemic symptoms.  - Will start with prednisone therapy for suspected nerve irritation - Home Physical therapy to help take pressure off nerve - Baclofen, tramadol for severe pain/spasm - If no improvement, RTC in 2-3 weeks for consideration of advanced imaging.  - Patient expresses understanding, he has no further questions or concerns.      Procedures: No procedures performed        PMFS History: Patient Active Problem List   Diagnosis Date Noted  .  HLD (hyperlipidemia)   . Hyperlipidemia 04/16/2015  . Cancer ([place])   . Candidal intertrigo 11/05/2012  . Hypertension    Past Medical History:  Diagnosis Date  . Brain tumor ([place])   . 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 HLD (hyperlipidemia)   . Hypertension     History reviewed. No pertinent family history.  Past Surgical History:  Procedure Laterality Date  . APPENDECTOMY    . BRAIN SURGERY     Social History   Occupational History  . Not on file  Tobacco Use  . Smoking status: Former  Research scientist (life sciences)  . Smokeless tobacco: Never Used  Substance and Sexual Activity  . Alcohol use: No    Alcohol/week: 0.0 standard drinks  . Drug use: No  . Sexual activity: Not Currently

## 2020-07-25 NOTE — Progress Notes (Signed)
I saw and examined the patient with Dr. Elouise Munroe and agree with assessment and plan as outlined.    Right low back and leg pain, concerning for radiculopathy.  Knee injection unfortunately did not help.  Will try prednisone, baclofen, tramadol.  Home exercises given.  If pain persists, consider x-rays and MRI of lumbar spine.

## 2020-08-07 ENCOUNTER — Ambulatory Visit (INDEPENDENT_AMBULATORY_CARE_PROVIDER_SITE_OTHER): Payer: Medicare Other | Admitting: Family Medicine

## 2020-08-07 ENCOUNTER — Ambulatory Visit (INDEPENDENT_AMBULATORY_CARE_PROVIDER_SITE_OTHER): Payer: Medicare Other

## 2020-08-07 ENCOUNTER — Other Ambulatory Visit: Payer: Self-pay

## 2020-08-07 DIAGNOSIS — M25561 Pain in right knee: Secondary | ICD-10-CM | POA: Diagnosis not present

## 2020-08-07 DIAGNOSIS — M545 Low back pain, unspecified: Secondary | ICD-10-CM

## 2020-08-07 NOTE — Progress Notes (Signed)
Office Visit Note   Patient: [name]           Date of Birth: [birth year]           MRN: [id number] Visit Date: 08/07/2020 Requested by: Susy Frizzle, MD [address] [place],  [place] [address] PCP: Susy Frizzle, MD  Subjective: Chief Complaint  Patient presents with  . Right Knee - Pain, Follow-up    Knee still hurts if he stands too long, and then the lower back starts to hurt. His right thigh is still sore. Has been trying the home exercises he was given, but stops as soon as he feels any pain - he said he is afraid to push further. Baclofen and Tramadol have eased the pain "about 50%," allowing him to at least walk around some.  . Lower Back - Pain, Follow-up    HPI: He is here for follow-up low back and right knee pain.  Since last visit he has done home exercises with improvement in symptoms.  Overall he feels somewhat better.  He still concerned about his right knee, he is afraid that it might be totally worn out.  When he tries to walk a long time, it really hurts.  Also still bothers him getting in and out of his vehicle.               ROS:   All other systems were reviewed and are negative.  Objective: Vital Signs: There were no vitals taken for this visit.  Physical Exam:  General:  Alert and oriented, in no acute distress. Pulm:  Breathing unlabored. Psy:  Normal mood, congruent affect.  Low back: He is tender to palpation in the L5-S1 region in the midline, but definitely less than before.  No significant pain over the greater trochanter, no pain with internal hip rotation.  Lower extremity strength and reflexes are normal. Right knee: No effusion today, no significant joint line tenderness.  Ligaments feel stable.    Imaging: XR Knee 1-2 Views Right  Result Date: 08/07/2020 X-rays of the right knee reveal early spurring in the patellofemoral joint, mild joint space narrowing on the lateral compartment.  No loose body or stress fracture  seen.  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  No sign of compression fracture or neoplasm.  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  Left hip replacement hardware is intact, no sign of loosening.   Assessment & Plan: 1.  Clinically improving low back pain -Continue with home exercises.  If he takes a turn for the worse, he will call me and we might proceed with MRI scan followed by epidural injection.  2.  Improving right knee pain, question degenerative meniscus tear -Continue with conservative treatment.  If it takes a turn for the worse, he will call me and I will order MRI scan to see if arthroscopic intervention is warranted.     Procedures: No procedures performed        PMFS History: Patient Active Problem List   Diagnosis Date Noted  . HLD (hyperlipidemia)   . Hyperlipidemia 04/16/2015  . Cancer ([place])   . Candidal intertrigo 11/05/2012  . Hypertension    Past Medical History:  Diagnosis Date  . Brain tumor ([place])   . 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  HLD (hyperlipidemia)   . Hypertension     No family history on file.  Past Surgical History:  Procedure Laterality Date  . APPENDECTOMY    . BRAIN SURGERY     Social History   Occupational History  . Not on file  Tobacco Use  . Smoking status: Former Research scientist (life sciences)  . Smokeless tobacco: Never Used  Substance and Sexual Activity  . Alcohol use: No    Alcohol/week: 0.0 standard drinks  . Drug use: No  . Sexual activity: Not Currently

## 2020-09-03 ENCOUNTER — Telehealth: Payer: Self-pay

## 2020-09-03 ENCOUNTER — Telehealth: Payer: Self-pay | Admitting: Family Medicine

## 2020-09-03 DIAGNOSIS — M545 Low back pain, unspecified: Secondary | ICD-10-CM

## 2020-09-03 NOTE — Telephone Encounter (Signed)
Pt called back checking on the status of his referral; he states Dr. Junius Roads didn;t tell him he would even need one just that he had to call and schedule. I gave the pt the # to [facility] and told him I would still check with Dr. Junius Roads.

## 2020-09-03 NOTE — Telephone Encounter (Signed)
Patient called for an update about a referral for an MRI. Please call patient about this matter at [phone].

## 2020-09-03 NOTE — Telephone Encounter (Signed)
The patient is aware that the imaging facility will be calling him to schedule the MRI.

## 2020-09-03 NOTE — Telephone Encounter (Signed)
Ordered

## 2020-09-03 NOTE — Telephone Encounter (Signed)
Duplicate message -- I called the patient.

## 2020-09-03 NOTE — Telephone Encounter (Signed)
The patient is having pains shooting down his right leg, making it more difficult for him to walk. He would like to proceed with an MRI of his lower back. The knee is not concerning enough to him right now to have one of the knee.

## 2020-09-03 NOTE — Telephone Encounter (Signed)
Pt would like to go ahead and set up an apt for an MRI

## 2020-09-04 ENCOUNTER — Other Ambulatory Visit: Payer: Self-pay | Admitting: Family Medicine

## 2020-09-05 ENCOUNTER — Telehealth: Payer: Self-pay | Admitting: *Deleted

## 2020-09-05 DIAGNOSIS — M25561 Pain in right knee: Secondary | ICD-10-CM

## 2020-09-05 NOTE — Telephone Encounter (Signed)
Knee MRI has been ordered.  Hip MRI not needed, since his pain is most likely coming from the lumbar spine.  If lumbar spine is normal, then we can investigate the hip.

## 2020-09-05 NOTE — Telephone Encounter (Signed)
Please advise 

## 2020-09-05 NOTE — Telephone Encounter (Signed)
I called and advised the patient that a knee MRI has now been ordered (and advised on reasoning behind holding off on a hip MRI).

## 2020-09-05 NOTE — Telephone Encounter (Signed)
Pt called stating he was recently in and discussed getting mri of lumbar spine and also wanted to get MRI of R hip and R knee, he thought he was getting these ordered along with lumbar spine but imagine told him only the Lumbar spine was ordered.   Pt would like to have the MRI of knee and hip ordered as well.

## 2020-09-12 ENCOUNTER — Other Ambulatory Visit: Payer: Self-pay

## 2020-09-12 ENCOUNTER — Encounter: Payer: Self-pay | Admitting: Family Medicine

## 2020-09-12 ENCOUNTER — Ambulatory Visit (INDEPENDENT_AMBULATORY_CARE_PROVIDER_SITE_OTHER): Payer: Medicare Other | Admitting: Family Medicine

## 2020-09-12 VITALS — BP 144/78 | HR 62 | Temp 98.1°F | Resp 16 | Ht 69.0 in | Wt 249.0 lb

## 2020-09-12 DIAGNOSIS — L209 Atopic dermatitis, unspecified: Secondary | ICD-10-CM

## 2020-09-12 MED ORDER — TRIAMCINOLONE ACETONIDE 0.1 % EX CREA: 1.0000 "application " | TOPICAL_CREAM | Freq: Two times a day (BID) | CUTANEOUS | 3 refills | Status: DC

## 2020-09-12 MED ORDER — PREDNISONE 20 MG PO TABS: ORAL_TABLET | ORAL | 0 refills | Status: DC

## 2020-09-12 NOTE — Progress Notes (Signed)
Subjective:    Patient ID: [name], male    DOB: 1941-03-30, 80 y.o.   MRN: [id number]    Patient presents with a severe itchy rash on his upper back in between his shoulder blades.  Rash consist of numerous erythematous papules coalescing into plaques and clumps in clusters.  Is extremely itchy.  He denies any pain or fever or chills.  He denies any rash anywhere else on his body.  He has a history of eczema for which she has been using triamcinolone however he is out. Past Medical History:  Diagnosis Date  . Brain tumor ([place])   . 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 HLD (hyperlipidemia)   . Hypertension    Past Surgical History:  Procedure Laterality Date  . APPENDECTOMY    . BRAIN SURGERY     Current Outpatient Medications on File Prior to Visit  Medication Sig Dispense Refill  . atenolol (TENORMIN) 25 MG tablet TAKE 1 TABLET BY MOUTH ONCE DAILY **NEEDS  OFFICE  VISIT  AND  LABS  BEFORE  FURTHER  REFILLS** 90 tablet 0  . baclofen (LIORESAL) 10 MG tablet Take 0.5-1 tablets (5-10 mg total) by mouth 3 (three) times daily as needed for muscle spasms. 30 each 3  . losartan (COZAAR) 100 MG tablet Take 1 tablet by mouth once daily 90 tablet 0  . predniSONE (DELTASONE) 10 MG tablet Take as directed for 12 days.  Daily dose 6,6,5,5,4,4,3,3,2,2,1,1. 42 tablet 0  . tiZANidine (ZANAFLEX) 4 MG capsule Take 1 capsule (4 mg total) by mouth 3 (three) times daily as needed for muscle spasms. 30 capsule 0  . traMADol (ULTRAM) 50 MG tablet Take 1 tablet (50 mg total) by mouth every 6 (six) hours as needed. 20 tablet 0   No current facility-administered medications on file prior to visit.   Allergies  Allergen Reactions  . Amlodipine     Leg swelling  . Red Dye Hives   Social History   Socioeconomic History  . Marital status: Married    Spouse name: Not on file  . Number of children: Not on file  . Years of education: Not on  file  . Highest education level: Not on file  Occupational History  . Not on file  Tobacco Use  . Smoking status: Former Research scientist (life sciences)  . Smokeless tobacco: Never Used  Substance and Sexual Activity  . Alcohol use: No    Alcohol/week: 0.0 standard drinks  . Drug use: No  . Sexual activity: Not Currently  Other Topics Concern  . Not on file  Social History Narrative  . Not on file   Social Determinants of Health   Financial Resource Strain: Not on file  Food Insecurity: Not on file  Transportation Needs: Not on file  Physical Activity: Not on file  Stress: Not on file  Social Connections: Not on file  Intimate Partner Violence: Not on file      Review of Systems  All other systems reviewed and are negative.      Objective:   Physical Exam Constitutional:      General: He is not in acute distress.    Appearance: Normal appearance. He is normal weight. He is not ill-appearing or toxic-appearing.  HENT:     Right Ear: Tympanic membrane and ear canal normal.     Left Ear: Tympanic membrane and ear canal normal.     Nose:  Nose normal. No congestion or rhinorrhea.     Mouth/Throat:     Mouth: Mucous membranes are moist.     Pharynx: No oropharyngeal exudate or posterior oropharyngeal erythema.  Eyes:     Conjunctiva/sclera: Conjunctivae normal.  Cardiovascular:     Rate and Rhythm: Normal rate and regular rhythm.     Heart sounds: Normal heart sounds. No murmur heard. No friction rub. No gallop.   Pulmonary:     Effort: Pulmonary effort is normal.     Breath sounds: No wheezing, rhonchi or rales.  Abdominal:     General: Bowel sounds are normal. There is no distension.     Palpations: Abdomen is soft.     Tenderness: There is no abdominal tenderness. There is no guarding or rebound.  Musculoskeletal:     Cervical back: Neck supple.  Lymphadenopathy:     Cervical: No cervical adenopathy.  Skin:    Findings: Rash present. Rash is macular and papular. Rash is not  pustular or vesicular.       Neurological:     Mental Status: He is alert.           Assessment & Plan:  Atopic dermatitis, unspecified type  Given location we will use prednisone taper pack.  Also refilled his triamcinolone cream that he can use twice a day as needed for relief until better.

## 2020-09-25 ENCOUNTER — Ambulatory Visit
Admission: RE | Admit: 2020-09-25 | Discharge: 2020-09-25 | Disposition: A | Discharge status: Home / Self Care | Payer: Medicare Other | Source: Ambulatory Visit | Attending: Family Medicine | Admitting: Family Medicine

## 2020-09-25 ENCOUNTER — Other Ambulatory Visit: Payer: Medicare Other

## 2020-09-25 DIAGNOSIS — M545 Low back pain, unspecified: Secondary | ICD-10-CM

## 2020-09-26 ENCOUNTER — Telehealth: Payer: Self-pay | Admitting: Family Medicine

## 2020-09-26 ENCOUNTER — Telehealth: Payer: Self-pay

## 2020-09-26 NOTE — Telephone Encounter (Signed)
Duplicate - see other message regarding MRI results.

## 2020-09-26 NOTE — Telephone Encounter (Signed)
I called and left voice mail for the patient to call me back regarding the MRI results.

## 2020-09-26 NOTE — Telephone Encounter (Signed)
MRI shows multiple disc bulges in the lumbar spine, along with arthritis and bone spurs of the facet joints.  This creates narrowing of multiple nerve openings with the most severe being the right side at L4-5.  This would certainly explain a lot of his symptoms.  If still in a lot of pain, I will make referral to Dr. Ernestina Patches for epidural steroid injection.

## 2020-09-26 NOTE — Telephone Encounter (Signed)
I spoke with the patient, advising him of the results. He is not in any pain right now. He is concerned about weakness in the right thigh, however. He said the leg will give way easily, when he walks --- not to the point that he falls, but he will start limping. Also, he said the thigh muscle feels "spongy" when he presses on it - the left thigh does not feel like that. He worries about the blood flow to those muscles. He is willing to go to the gym to exercise if that will help, but he does not want to do anything until told to do so. Please advise.

## 2020-09-26 NOTE — Telephone Encounter (Signed)
Patient called regarding a vm you left call back:[phone]

## 2020-09-27 ENCOUNTER — Telehealth: Payer: Self-pay | Admitting: Family Medicine

## 2020-09-27 ENCOUNTER — Other Ambulatory Visit: Payer: Self-pay | Admitting: Family Medicine

## 2020-09-27 DIAGNOSIS — M545 Low back pain, unspecified: Secondary | ICD-10-CM

## 2020-09-27 DIAGNOSIS — M25561 Pain in right knee: Secondary | ICD-10-CM

## 2020-09-27 NOTE — Telephone Encounter (Signed)
The weakness is most likely due to nerve compression rather than blood circulation.  I think it would be best for him to actually go to a physical therapist for a couple weeks.  I've requested referral to [facility].  If his weakness doesn't start to improve, then we'll order nerve conduction testing.

## 2020-09-27 NOTE — Telephone Encounter (Signed)
Pt called wondering if he can get his X-rays on a disc and he would like call when its ready! Cb [phone]

## 2020-09-27 NOTE — Telephone Encounter (Signed)
I called and left this full response on the patient's voice mail. Advised him [facility] PT should be calling him to set up an appointment. If he has any questions/concerns, advised him to give [place] a call back.

## 2020-09-27 NOTE — Telephone Encounter (Signed)
I called the patient. He said he would like a copy of his MRI on cd. Advised him to call [facility] for this, as that is where it was performed. I did offer him a cd of his plain Lsp xrays from February that were done here. He declined.

## 2020-09-27 NOTE — Telephone Encounter (Signed)
error 

## 2020-12-03 ENCOUNTER — Encounter: Payer: Self-pay | Admitting: Nurse Practitioner

## 2020-12-03 ENCOUNTER — Ambulatory Visit: Payer: Medicare Other | Admitting: Nurse Practitioner

## 2020-12-03 ENCOUNTER — Other Ambulatory Visit: Payer: Self-pay

## 2020-12-03 ENCOUNTER — Ambulatory Visit (INDEPENDENT_AMBULATORY_CARE_PROVIDER_SITE_OTHER): Payer: Medicare Other | Admitting: Nurse Practitioner

## 2020-12-03 VITALS — BP 140/82 | HR 50 | Temp 98.0°F | Resp 14 | Ht 69.0 in | Wt 252.0 lb

## 2020-12-03 DIAGNOSIS — S20462A Insect bite (nonvenomous) of left back wall of thorax, initial encounter: Secondary | ICD-10-CM | POA: Diagnosis not present

## 2020-12-03 DIAGNOSIS — W57XXXA Bitten or stung by nonvenomous insect and other nonvenomous arthropods, initial encounter: Secondary | ICD-10-CM

## 2020-12-03 MED ORDER — DOXYCYCLINE HYCLATE 100 MG PO TABS: 100.0000 mg | ORAL_TABLET | Freq: Two times a day (BID) | ORAL | 0 refills | Status: DC

## 2020-12-03 NOTE — Progress Notes (Signed)
Subjective:    Patient ID: [name], male    DOB: 06/17/1941, 80 y.o.   MRN: [id number]  HPI: [name] is a 80 y.o. male presenting for tick bite.  Chief Complaint  Patient presents with  . Tick Bite    2 on back- on partially removed   Lives alone.  [place] has [place] 2 tick bites.  TICK BITE Duration: 1 bite ~3 weeks - 1 month ago; reports sister is nervous because it is red.  Second bite noticed Friday and tried to remove at home; head broke off inside skin and requesting complete removal today.   Location: left side of back Onset: 3-4 days ago; does not know how long it was attached for. Itching: yes Status: stable Treatments attempted: trying to scratch it out Fever: no Chills: no headache: yes; brief periods of temple pain on left side Joint pain: no Rash: yes  Allergies  Allergen Reactions  . Amlodipine     Leg swelling  . Red Dye Hives    Outpatient Encounter Medications as of 12/03/2020  Medication Sig  . atenolol (TENORMIN) 25 MG tablet TAKE 1 TABLET BY MOUTH ONCE DAILY **NEEDS  OFFICE  VISIT  AND  LABS  BEFORE  FURTHER  REFILLS**  . doxycycline (VIBRA-TABS) 100 MG tablet Take 1 tablet (100 mg total) by mouth 2 (two) times daily.  Marland Kitchen losartan (COZAAR) 100 MG tablet Take 1 tablet by mouth once daily  . [DISCONTINUED] predniSONE (DELTASONE) 20 MG tablet 3 tabs poqday 1-2, 2 tabs poqday 3-4, 1 tab poqday 5-6  . [DISCONTINUED] tiZANidine (ZANAFLEX) 4 MG capsule Take 1 capsule (4 mg total) by mouth 3 (three) times daily as needed for muscle spasms.  . [DISCONTINUED] triamcinolone (KENALOG) 0.1 % Apply 1 application topically 2 (two) times daily.   No facility-administered encounter medications on file as of 12/03/2020.    Patient Active Problem List   Diagnosis Date Noted  . HLD (hyperlipidemia)   . Hyperlipidemia 04/16/2015  . Cancer ([place])   . Candidal intertrigo 11/05/2012  . Hypertension     Past  Medical History:  Diagnosis Date  . Brain tumor ([place])   . 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 HLD (hyperlipidemia)   . Hypertension     Relevant past medical, surgical, family and social history reviewed and updated as indicated. Interim medical history since our last visit reviewed.  Review of Systems Per HPI unless specifically indicated above     Objective:    BP 140/82   Pulse (!) 50   Temp 98 F (36.7 C) (Temporal)   Resp 14   Ht 5\' 9"  (1.753 m)   Wt 252 lb (114.3 kg)   SpO2 100%   BMI 37.21 kg/m   Wt Readings from Last 3 Encounters:  12/03/20 252 lb (114.3 kg)  09/12/20 249 lb (112.9 kg)  01/31/20 248 lb (112.5 kg)    Physical Exam Vitals reviewed.  Constitutional:      General: He is not in acute distress.    Appearance: Normal appearance. He is obese. He is not toxic-appearing.  HENT:     Head: Normocephalic and atraumatic.  Eyes:     General: No scleral icterus.    Extraocular Movements: Extraocular movements intact.  Musculoskeletal:     Cervical back: Normal range of motion and neck supple.  Skin:  General: Skin is warm and dry.     Capillary Refill: Capillary refill takes less than 2 seconds.     Coloration: Skin is not jaundiced or pale.     Findings: Erythema and rash present.          Comments: Two areas of erythema to left mid back.  Most lateral lesion approximately 3 cm x 2 cm area of erythema with papable welt.  More medial lesion approximately similar in size with dark scab over middle.  No warmth, oozing, fluctuance, tenderness to palpation, or central clearing of either lesion.   Neurological:     Mental Status: He is alert and oriented to person, place, and time.     Motor: No weakness.     Gait: Gait normal.  Psychiatric:        Mood and Affect: Mood normal.        Behavior: Behavior normal.        Thought Content: Thought content normal.        Judgment: Judgment normal.        Assessment & Plan:  1. Tick bite of left back wall of thorax, initial encounter Acute.  No central clearing of either lesion today.  Patient request tick be completley removed from skin today.  Procedure explained to patient questions answered benefits and risks discussed verbal consent obtained.  Area with question of tick remains prepped with betadine.  2 cc of lidocaine used to numb area.  Scab removed from skin and remaining tick removed with forceps.  Bleeding controlled with sliver nitrate.  Patient tolerated procedure well.  Triple antibiotic ointment applied with bandage.  Wound care discussed. Follow up for recheck later this week.  Prescription given for doxycycline to start IF s/s Lyme disease start or if rash starts with central clearing.  Do not use to prevent illness.   - doxycycline (VIBRA-TABS) 100 MG tablet; Take 1 tablet (100 mg total) by mouth 2 (two) times daily.  Dispense: 28 tablet; Refill: 0    Follow up plan: Return in about 3 days (around 12/06/2020) for tick bite f/u.

## 2020-12-07 ENCOUNTER — Ambulatory Visit: Payer: Medicare Other | Admitting: Nurse Practitioner

## 2020-12-10 ENCOUNTER — Other Ambulatory Visit: Payer: Self-pay | Admitting: Family Medicine

## 2020-12-20 ENCOUNTER — Telehealth: Payer: Self-pay | Admitting: Family Medicine

## 2020-12-20 NOTE — Telephone Encounter (Signed)
Left message for patient to call back and schedule Medicare Annual Wellness Visit (AWV) in office.   If not able to come in office, please offer to do virtually or by telephone.   No history of  AWV: Per [place] eligible for AWVI as of  06/30/2009  Please schedule at anytime with [facility]-Nurse Health Advisor.  If any questions, please contact me at [phone]  

## 2021-02-21 ENCOUNTER — Ambulatory Visit (INDEPENDENT_AMBULATORY_CARE_PROVIDER_SITE_OTHER): Payer: Medicare Other | Admitting: Family Medicine

## 2021-02-21 ENCOUNTER — Encounter: Payer: Self-pay | Admitting: Family Medicine

## 2021-02-21 ENCOUNTER — Other Ambulatory Visit: Payer: Self-pay

## 2021-02-21 VITALS — BP 120/86 | HR 50 | Temp 97.9°F | Resp 18

## 2021-02-21 DIAGNOSIS — J40 Bronchitis, not specified as acute or chronic: Secondary | ICD-10-CM

## 2021-02-21 MED ORDER — PREDNISONE 20 MG PO TABS: ORAL_TABLET | ORAL | 0 refills | Status: DC

## 2021-02-21 MED ORDER — ALBUTEROL SULFATE HFA 108 (90 BASE) MCG/ACT IN AERS: 2.0000 | INHALATION_SPRAY | Freq: Four times a day (QID) | RESPIRATORY_TRACT | 0 refills | Status: DC | PRN

## 2021-02-21 MED ORDER — AZITHROMYCIN 250 MG PO TABS: ORAL_TABLET | ORAL | 0 refills | Status: DC

## 2021-02-21 NOTE — Progress Notes (Signed)
Subjective:    Patient ID: [name], male    DOB: Nov 11, 1940, 80 y.o.   MRN: [id number]  Patient reports an 8-day history of coughing.  He states that he can hear himself wheezing.  He states that he feels tightness in his chest at night when he is coughing.  He does have some mild shortness of breath with activity.  He denies any angina.  He denies any purulent sputum.  He does report chest congestion.  He denies any leg swelling.  He denies any orthopnea.  He denies any paroxysmal nocturnal dyspnea.  He does have some rhinorrhea and some postnasal drip.  He denies any fever or chills.  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 28 tablet 0   losartan (COZAAR) 100 MG tablet Take 1 tablet by mouth once daily 90 tablet 0   No current facility-administered medications on file prior to visit.   Allergies  Allergen Reactions   Amlodipine     Leg swelling   Red Dye Hives   Social History   Socioeconomic History   Marital status: Married    Spouse name: Not on file   Number of children: Not on file   Years of education: Not on file   Highest education level: Not on file  Occupational History   Not on file  Tobacco Use   Smoking status: Former   Smokeless tobacco: Never  Substance and Sexual Activity   Alcohol use: No    Alcohol/week: 0.0 standard drinks   Drug use: No   Sexual activity: Not  Currently  Other Topics Concern   Not on file  Social History Narrative   Not on file   Social Determinants of Health   Financial Resource Strain: Not on file  Food Insecurity: Not on file  Transportation Needs: Not on file  Physical Activity: Not on file  Stress: Not on file  Social Connections: Not on file  Intimate Partner Violence: Not on file      Review of Systems  All other systems reviewed and are negative.     Objective:   Physical Exam Constitutional:      General: He is not in acute distress.    Appearance: Normal appearance. He is normal weight. He is not ill-appearing or toxic-appearing.  HENT:     Right Ear: Tympanic membrane and ear canal normal.     Left Ear: Tympanic membrane and ear canal normal.     Nose: Nose normal. No congestion or rhinorrhea.     Mouth/Throat:     Mouth: Mucous membranes are moist.     Pharynx: No oropharyngeal exudate or posterior oropharyngeal erythema.  Eyes:     Conjunctiva/sclera: Conjunctivae normal.  Cardiovascular:     Rate and Rhythm: Normal rate and  regular rhythm.     Heart sounds: Normal heart sounds. No murmur heard.   No friction rub. No gallop.  Pulmonary:     Effort: Pulmonary effort is normal.     Breath sounds: Decreased air movement present. Decreased breath sounds and wheezing present. No rhonchi or rales.  Abdominal:     General: Bowel sounds are normal. There is no distension.     Palpations: Abdomen is soft.     Tenderness: There is no abdominal tenderness. There is no guarding or rebound.  Musculoskeletal:     Cervical back: Neck supple.  Lymphadenopathy:     Cervical: No cervical adenopathy.  Skin:    Findings: No rash.  Neurological:     Mental Status: He is alert.          Assessment & Plan:  Bronchitis with acute wheezing I believe this is likely viral or even allergic.  I will start the patient on prednisone taper pack in addition to albuterol 2 puffs every 6 hours as needed for  wheezing.  If he develops fever or purulent sputum I gave him a Z-Pak but I instructed him not to fill that at the present time unless symptoms worsen.  Continue to use Mucinex DM as he is currently doing at home for chest congestion.

## 2021-03-11 ENCOUNTER — Other Ambulatory Visit: Payer: Self-pay | Admitting: Family Medicine

## 2021-03-22 ENCOUNTER — Telehealth: Payer: Self-pay

## 2021-03-22 NOTE — Telephone Encounter (Signed)
Left message for patient to return call to the office to schedule annual wellness visit. Kathrene Alu RN

## 2021-05-21 ENCOUNTER — Telehealth: Payer: Self-pay | Admitting: Family Medicine

## 2021-05-21 NOTE — Telephone Encounter (Signed)
Left message for patient to call back and schedule Medicare Annual Wellness Visit (AWV) in office.  ° °If not able to come in office, please offer to do virtually or by telephone.  Left office number and my jabber #[phone]. ° °AWVI eligible as of 06/30/2009 ° °Please schedule at anytime with Nurse Health Advisor. °  °

## 2021-06-11 ENCOUNTER — Other Ambulatory Visit: Payer: Self-pay | Admitting: Family Medicine

## 2021-06-26 ENCOUNTER — Telehealth: Payer: Self-pay | Admitting: Family Medicine

## 2021-06-26 NOTE — Telephone Encounter (Signed)
Left message for patient to call back and schedule Medicare Annual Wellness Visit (AWV) in office.  ° °If not able to come in office, please offer to do virtually or by telephone.  Left office number and my jabber #[phone]. ° °Due for AWVI ° °Please schedule at anytime with Nurse Health Advisor. °  °

## 2021-07-13 IMAGING — MR MR LUMBAR SPINE W/O CM
4 of 5 series · 27 of 48 positions shown · non-contrast
Comparison: None.

CLINICAL DATA: Low back pain with right leg pain

EXAM:
MRI LUMBAR SPINE WITHOUT CONTRAST
TECHNIQUE: Multiplanar, multisequence MR imaging of the lumbar spine was
performed. No intravenous contrast was administered.

[Series 2: T2 · sagittal · 4.0mm · 1.09mm/px · 6 of 17 slices shown (1 of 2)]
[im 1/17]
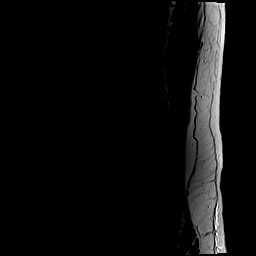
[im 4/17]
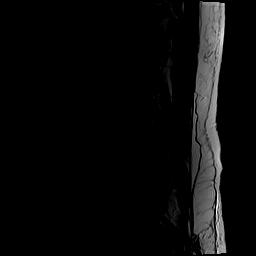
[im 7/17]
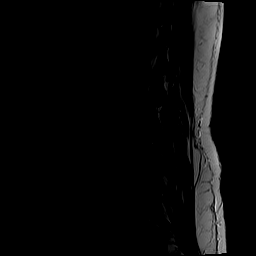
[im 10/17]
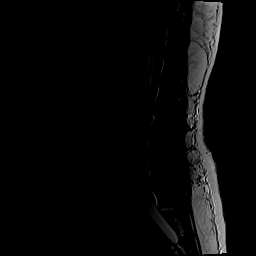
[im 13/17]
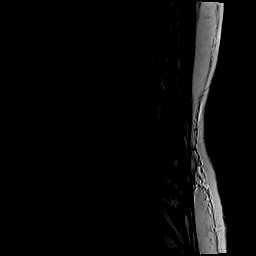
[im 17/17]
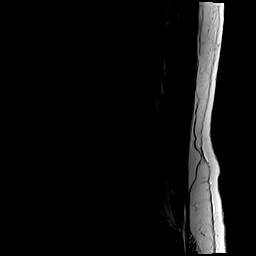

[Series 4: T1 · sagittal · 4.0mm · 1.09mm/px · 6 of 17 slices shown (1 of 2)]
[im 1/17]
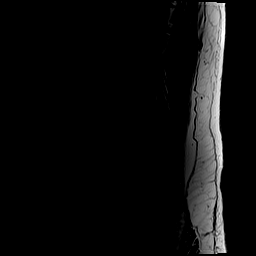
[im 4/17]
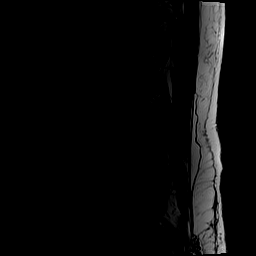
[im 7/17]
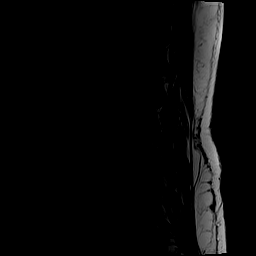
[im 10/17]
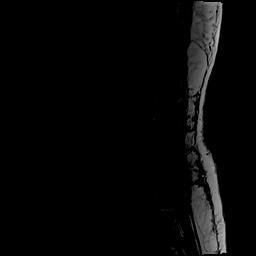
[im 13/17]
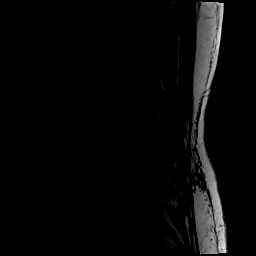
[im 17/17]
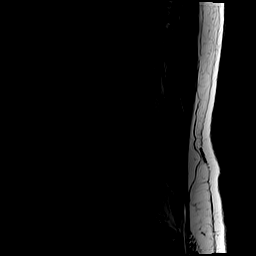

[Series 5: T2 · axial · 4.0mm · 0.39mm/px · z∈[-85,+117]mm · 9 of 40 slices shown (2 of 2)]
[im 1/40]
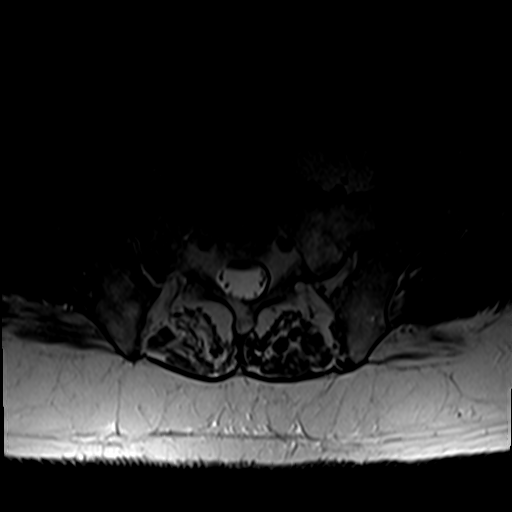
[im 6/40]
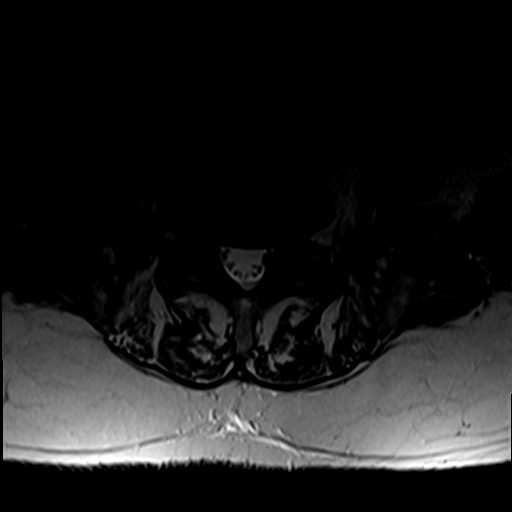
[im 12/40]
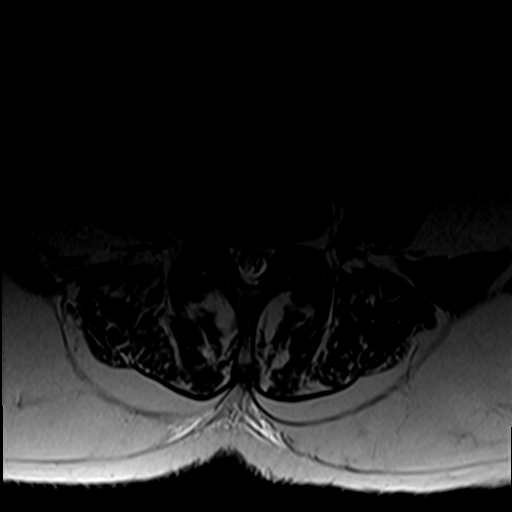
[im 17/40]
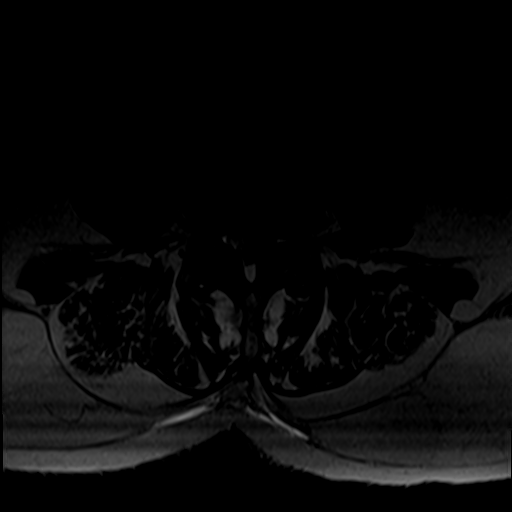
[im 20/40]
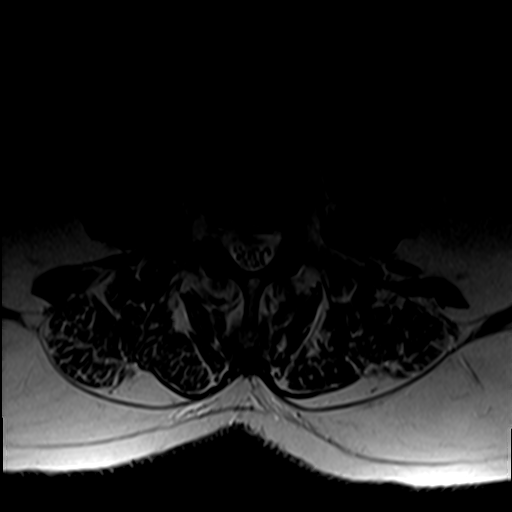
[im 23/40]
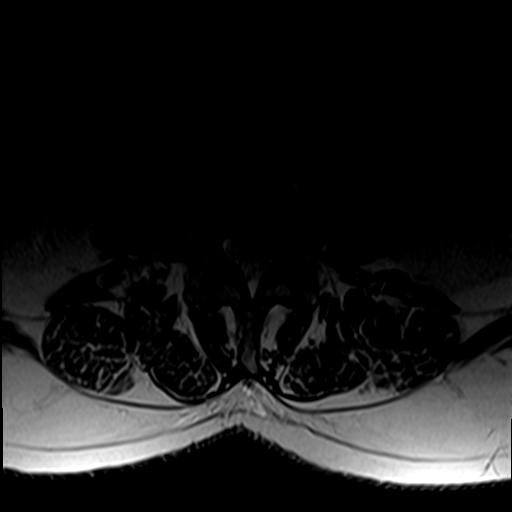
[im 28/40]
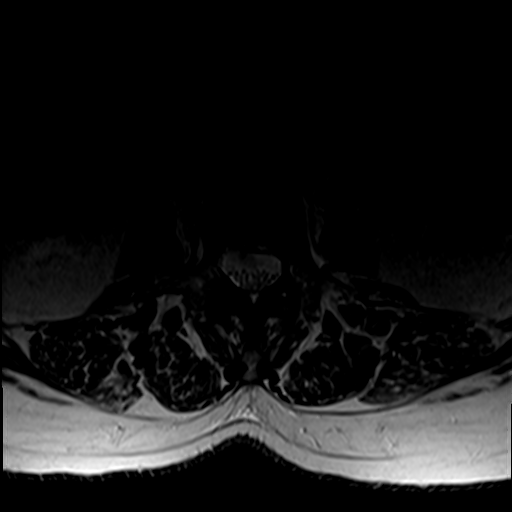
[im 34/40]
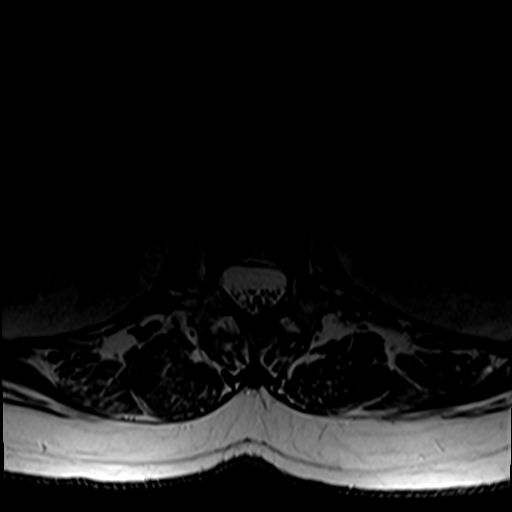
[im 40/40]
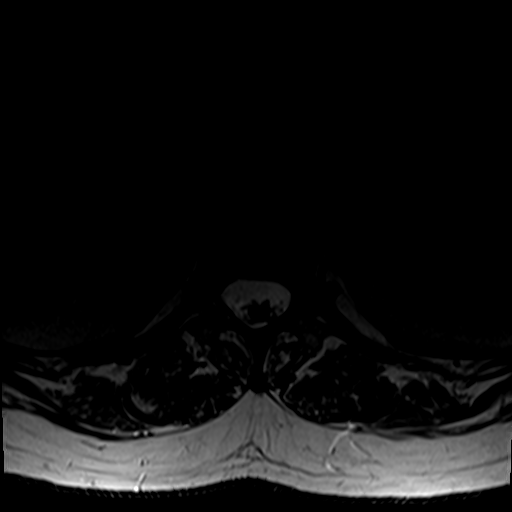

[Series 6: T1 · axial · 4.0mm · 0.39mm/px · z∈[-85,+89]mm · 6 of 40 slices shown (2 of 2)]
[im 1/40]
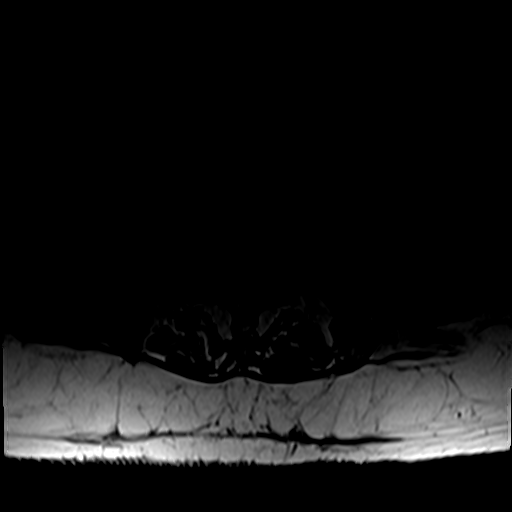
[im 6/40]
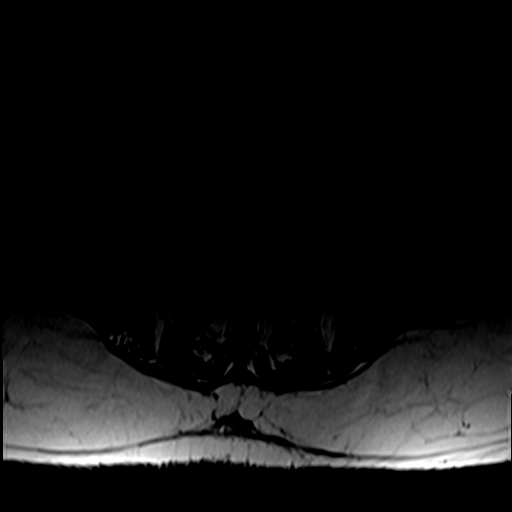
[im 12/40]
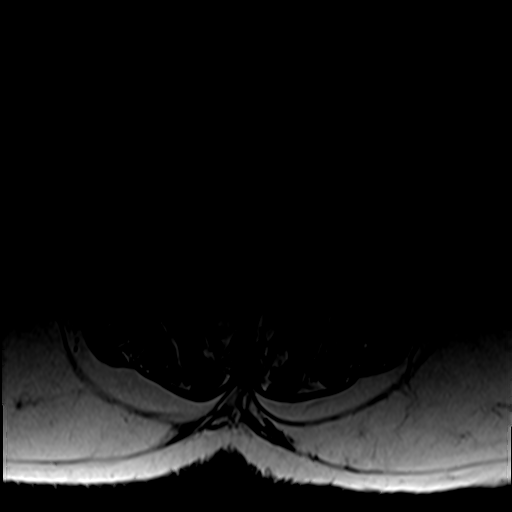
[im 17/40]
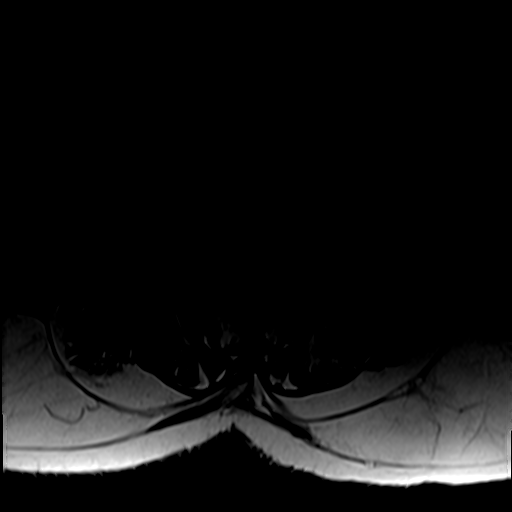
[im 20/40]
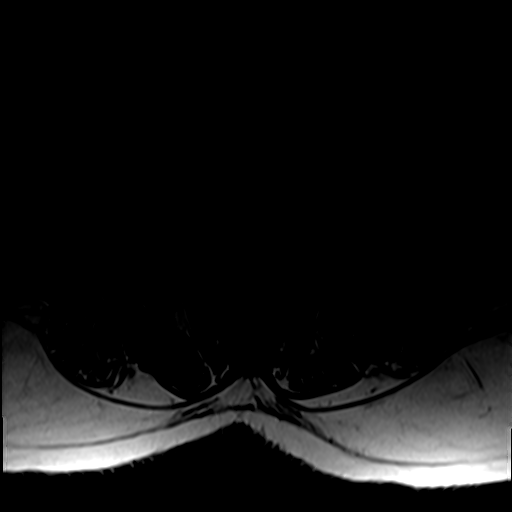
[im 34/40]
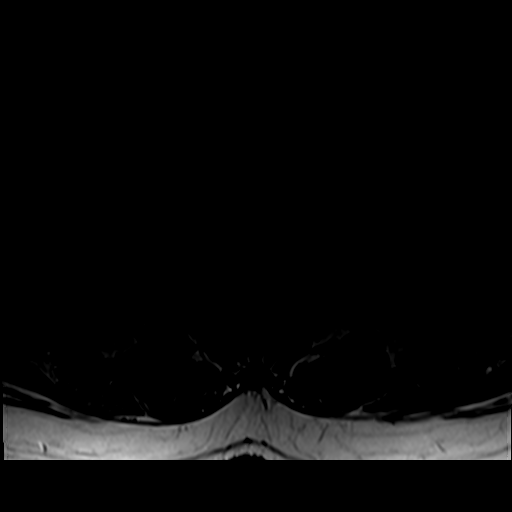

[27 of 48 positions shown; findings below may reference images not displayed]

FINDINGS: Segmentation:  Standard.

Alignment:  Preserved.

Vertebrae:  Vertebral body heights are maintained.

Conus medullaris and cauda equina: Conus extends to the L1 level.
Conus and cauda equina appear normal.

Paraspinal and other soft tissues: Unremarkable.

Disc levels:

L1-L2: Disc bulge slightly eccentric to the left. No canal or right
foraminal stenosis. Mild left foraminal stenosis.

L2-L3: Disc bulge with superimposed left subarticular protrusion.
Mild facet arthropathy. Minor canal stenosis. Narrowing of the left
subarticular recess. Mild foraminal stenosis.

L3-L4: Disc bulge with central annular fissure. Mild facet
arthropathy with ligamentum flavum infolding. Mild canal stenosis.
Mild right and mild to moderate left foraminal stenosis.

L4-L5: Disc bulge with central annular fissure. Moderate facet
arthropathy with ligamentum flavum infolding. Mild canal stenosis.
Moderate to marked right and mild left foraminal stenosis.

L5-S1: Disc bulge. Mild right and moderate left facet arthropathy
with ligamentum flavum infolding. No canal stenosis. Mild foraminal
stenosis.
IMPRESSION: Multilevel degenerative changes as detailed above. There is no
high-grade canal stenosis. Right foraminal narrowing is greatest at
L4-L5.

## 2021-07-22 ENCOUNTER — Telehealth: Payer: Self-pay | Admitting: Family Medicine

## 2021-07-22 NOTE — Telephone Encounter (Signed)
Left message for patient to call back and schedule Medicare Annual Wellness Visit (AWV) in office.  ° °If not able to come in office, please offer to do virtually or by telephone.  Left office number and my jabber #[phone]. ° °Due for AWVI ° °Please schedule at anytime with Nurse Health Advisor. °  °

## 2021-08-07 ENCOUNTER — Telehealth: Payer: Self-pay | Admitting: Family Medicine

## 2021-08-07 NOTE — Telephone Encounter (Signed)
Left message for patient to call back and schedule Medicare Annual Wellness Visit (AWV) in office.  ° °If not able to come in office, please offer to do virtually or by telephone.  Left office number and my jabber #[phone]. ° °Due for AWVI ° °Please schedule at anytime with Nurse Health Advisor. °  °

## 2021-08-22 ENCOUNTER — Telehealth: Payer: Self-pay | Admitting: Family Medicine

## 2021-08-22 NOTE — Telephone Encounter (Signed)
Left message for patient to call back and schedule Medicare Annual Wellness Visit (AWV) in office.  ° °If not able to come in office, please offer to do virtually or by telephone.  Left office number and my jabber #[phone]. ° °Due for AWVI ° °Please schedule at anytime with Nurse Health Advisor. °  °

## 2021-09-09 ENCOUNTER — Telehealth: Payer: Self-pay | Admitting: Family Medicine

## 2021-09-09 NOTE — Telephone Encounter (Signed)
Left message for patient to call back and schedule Medicare Annual Wellness Visit (AWV) in office.  ° °If not able to come in office, please offer to do virtually or by telephone.  Left office number and my jabber #[phone]. ° °AWVI eligible as of 06/30/2009 ° °Please schedule at anytime with Nurse Health Advisor. °  °

## 2021-09-20 ENCOUNTER — Other Ambulatory Visit: Payer: Self-pay

## 2021-09-20 ENCOUNTER — Ambulatory Visit (INDEPENDENT_AMBULATORY_CARE_PROVIDER_SITE_OTHER): Payer: Medicare Other | Admitting: Family Medicine

## 2021-09-20 ENCOUNTER — Encounter: Payer: Self-pay | Admitting: Family Medicine

## 2021-09-20 ENCOUNTER — Ambulatory Visit: Payer: Medicare Other | Admitting: Family Medicine

## 2021-09-20 VITALS — BP 142/84 | HR 51 | Temp 97.4°F | Ht 69.0 in | Wt 251.8 lb

## 2021-09-20 DIAGNOSIS — J019 Acute sinusitis, unspecified: Secondary | ICD-10-CM

## 2021-09-20 DIAGNOSIS — L03114 Cellulitis of left upper limb: Secondary | ICD-10-CM

## 2021-09-20 MED ORDER — CEFDINIR 300 MG PO CAPS: 300.0000 mg | ORAL_CAPSULE | Freq: Two times a day (BID) | ORAL | 0 refills | Status: DC

## 2021-09-20 NOTE — Progress Notes (Signed)
? ?Subjective:  ? ? Patient ID: [name], male    DOB: Sep 03, 1940, 81 y.o.   MRN: [id number] ? ?Sinus Problem ?For the past 2 weeks, the patient reports migratory pain in his frontal sinus area.  He states that there is a pressure-like sensation above his right eye.  He states it feels like there is somebody pressing on his eyeball from time.  At other times he has a pressure-like pain behind his left eye.  Is always in the forehead or behind his eyes but it does seem to switch side to side.  He also reports feeling some dizziness.  He reports congestion and rhinorrhea.  He states this is been going on off and on for the last 2 to 3 weeks.  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  There is erythema spreading up to his forearm towards the medial epicondyle.  The skin there is pink warm and hot.  He appears to be developing a secondary cellulitis. ?Past Medical History:  ?Diagnosis Date  ? Brain tumor ([place])   ? Cancer [facility])   ? 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 HLD (hyperlipidemia)   ? Hypertension   ? ?Past Surgical History:  ?Procedure Laterality Date  ? APPENDECTOMY    ? BRAIN SURGERY    ? ?Current Outpatient Medications on File Prior to Visit  ?Medication Sig Dispense Refill  ? albuterol (VENTOLIN HFA) 108 (90 Base) MCG/ACT inhaler Inhale 2 puffs into the lungs every 6 (six) hours as needed for wheezing or shortness of breath. 8 g 0  ? atenolol (TENORMIN) 25 MG tablet TAKE 1 TABLET BY MOUTH ONCE DAILY(APPOINTMENT REQUIRED AND LABS FOR FUTURE REFILLS) 90 tablet 0  ? azithromycin (ZITHROMAX) 250 MG tablet 2 tabs poqday1, 1 tab poqday 2-5 6 tablet 0  ? doxycycline (VIBRA-TABS) 100 MG tablet Take 1 tablet (100 mg total) by mouth 2 (two) times daily. 28 tablet 0  ? losartan (COZAAR) 100 MG tablet Take 1 tablet by mouth once daily 90 tablet 0  ? predniSONE (DELTASONE) 20 MG tablet 3 tabs poqday 1-2, 2 tabs poqday 3-4, 1 tab  poqday 5-6 12 tablet 0  ? ?No current facility-administered medications on file prior to visit.  ? ?Allergies  ?Allergen Reactions  ? Amlodipine   ?  Leg swelling  ? Red Dye Hives  ? ?Social History  ? ?Socioeconomic History  ? Marital status: Married  ?  Spouse name: Not on file  ? Number of children: Not on file  ? Years of education: Not on file  ? Highest education level: Not on file  ?Occupational History  ? Not on file  ?Tobacco Use  ? Smoking status: Former  ? Smokeless tobacco: Never  ?Substance and Sexual Activity  ? Alcohol use: No  ?  Alcohol/week: 0.0 standard drinks  ? Drug use: No  ? Sexual activity: Not Currently  ?Other Topics Concern  ? Not on file  ?Social History Narrative  ? Not on file  ? ?Social Determinants of Health  ? ?Financial Resource Strain: Not on file  ?Food Insecurity: Not on file  ?Transportation Needs: Not on file  ?Physical Activity: Not on file  ?Stress: Not on file  ?Social Connections: Not on file  ?Intimate Partner Violence: Not on file  ? ? ? ? ?Review of Systems  ?All other systems reviewed and are negative. ? ?   ?  Objective:  ? Physical Exam ?Constitutional:   ?   General: He is not in acute distress. ?   Appearance: Normal appearance. He is normal weight. He is not ill-appearing or toxic-appearing.  ?HENT:  ?   Right Ear: Tympanic membrane and ear canal normal.  ?   Left Ear: Tympanic membrane and ear canal normal.  ?   Nose: Congestion present. No rhinorrhea.  ?   Right Turbinates: Swollen.  ?   Left Turbinates: Swollen.  ?   Right Sinus: Frontal sinus tenderness present.  ?   Left Sinus: Frontal sinus tenderness present.  ?   Mouth/Throat:  ?   Mouth: Mucous membranes are moist.  ?   Pharynx: No oropharyngeal exudate or posterior oropharyngeal erythema.  ?Eyes:  ?   Conjunctiva/sclera: Conjunctivae normal.  ?Cardiovascular:  ?   Rate and Rhythm: Normal rate and regular rhythm.  ?   Heart sounds: Normal heart sounds. No murmur heard. ?  No friction rub. No gallop.   ?Pulmonary:  ?   Effort: Pulmonary effort is normal.  ?   Breath sounds: Examination of the left-lower field reveals rales. Rales present. No wheezing or rhonchi.  ?Abdominal:  ?   General: Bowel sounds are normal. There is no distension.  ?   Palpations: Abdomen is soft.  ?   Tenderness: There is no abdominal tenderness. There is no guarding or rebound.  ?Musculoskeletal:  ?   Left forearm: Laceration present.  ?     Arms: ? ?   Cervical back: Neck supple.  ?Lymphadenopathy:  ?   Cervical: No cervical adenopathy.  ?Skin: ?   Findings: No rash.  ?Neurological:  ?   Mental Status: He is alert.  ? ? ? ? ? ?   ?Assessment & Plan:  ?Cellulitis of left upper extremity ? ?Acute rhinosinusitis ?I will try to cover both with Omnicef 300 mg p.o. twice daily for 10 days.  Recheck next week if no better or sooner if worsening. ?

## 2021-09-23 ENCOUNTER — Other Ambulatory Visit: Payer: Self-pay | Admitting: Family Medicine

## 2021-10-10 ENCOUNTER — Telehealth: Payer: Self-pay | Admitting: Family Medicine

## 2021-10-10 NOTE — Telephone Encounter (Signed)
Left message for patient to call back and schedule Medicare Annual Wellness Visit (AWV) in office.  ° °If not able to come in office, please offer to do virtually or by telephone.  Left office number and my jabber #[phone]. ° °Due for AWVI ° °Please schedule at anytime with Nurse Health Advisor. °  °

## 2021-10-30 ENCOUNTER — Telehealth: Payer: Self-pay | Admitting: Family Medicine

## 2021-10-30 NOTE — Telephone Encounter (Signed)
Left message for patient to call back and schedule Medicare Annual Wellness Visit (AWV) in office.  ° °If not able to come in office, please offer to do virtually or by telephone.  Left office number and my jabber #[phone]. ° °Due for AWVI ° °Please schedule at anytime with Nurse Health Advisor. °  °

## 2022-04-01 ENCOUNTER — Ambulatory Visit (INDEPENDENT_AMBULATORY_CARE_PROVIDER_SITE_OTHER): Payer: Medicare Other | Admitting: Family Medicine

## 2022-04-01 VITALS — BP 118/76 | HR 49 | Ht 69.0 in | Wt 243.0 lb

## 2022-04-01 DIAGNOSIS — I10 Essential (primary) hypertension: Secondary | ICD-10-CM

## 2022-04-01 DIAGNOSIS — E7800 Pure hypercholesterolemia, unspecified: Secondary | ICD-10-CM | POA: Diagnosis not present

## 2022-04-01 NOTE — Progress Notes (Signed)
Subjective:    Patient ID: [name], male    DOB: Dec 25, 1940, 81 y.o.   MRN: [id number]  Patient is a very pleasant 81 year old Caucasian gentleman here today for follow-up of hypertension.  He is currently on losartan as well as atenolol.  He is not checking his blood pressure.  Here today his blood pressure is excellent at 118/76 however his heart rate is low at 49.  He does report some fatigue.  He denies any lightheadedness or dizziness or syncope.  He denies any chest pain or shortness of breath or dyspnea on exertion.  He declines a flu shot.  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 90 tablet 1   losartan (COZAAR) 100 MG tablet Take 1 tablet by mouth once daily 90 tablet 1   albuterol (VENTOLIN HFA) 108 (90 Base) MCG/ACT inhaler Inhale 2 puffs into the lungs every 6 (six) hours as needed for wheezing or shortness of breath. (Patient not taking: Reported on 04/01/2022) 8 g 0   cefdinir (OMNICEF) 300 MG capsule Take 1 capsule (300 mg total) by mouth 2 (two) times daily. (Patient not taking: Reported on 04/01/2022) 20 capsule 0   No current facility-administered medications on file prior to visit.   Allergies  Allergen Reactions   Amlodipine     Leg swelling   Red Dye Hives   Social History   Socioeconomic History   Marital status: Married    Spouse name: Not on file   Number of children: Not on file   Years of education: Not on file   Highest education  level: Not on file  Occupational History   Not on file  Tobacco Use   Smoking status: Former   Smokeless tobacco: Never  Substance and Sexual Activity   Alcohol use: No    Alcohol/week: 0.0 standard drinks of alcohol   Drug use: No   Sexual activity: Not Currently  Other Topics Concern   Not on file  Social History Narrative   Not on file   Social Determinants of Health   Financial Resource Strain: Not on file  Food Insecurity: Not on file  Transportation Needs: Not on file  Physical Activity: Not on file  Stress: Not on file  Social Connections: Not on file  Intimate Partner Violence: Not on file      Review of Systems  All other systems reviewed and are negative.      Objective:   Physical Exam Constitutional:      General: He is not in acute distress.    Appearance: Normal appearance. He is normal weight. He is not ill-appearing or toxic-appearing.  HENT:     Right Ear: Tympanic membrane and ear canal normal.     Left Ear: Tympanic membrane and ear canal normal.     Nose: Nose normal. No congestion or rhinorrhea.     Mouth/Throat:  Mouth: Mucous membranes are moist.     Pharynx: No oropharyngeal exudate or posterior oropharyngeal erythema.  Eyes:     Conjunctiva/sclera: Conjunctivae normal.  Cardiovascular:     Rate and Rhythm: Normal rate and regular rhythm.     Heart sounds: Normal heart sounds. No murmur heard.    No friction rub. No gallop.  Pulmonary:     Effort: Pulmonary effort is normal.     Breath sounds: No wheezing, rhonchi or rales.  Abdominal:     General: Bowel sounds are normal. There is no distension.     Palpations: Abdomen is soft.     Tenderness: There is no abdominal tenderness. There is no guarding or rebound.  Musculoskeletal:     Cervical back: Neck supple.  Lymphadenopathy:     Cervical: No cervical adenopathy.  Skin:    Findings: No rash.  Neurological:     Mental Status: He is alert.           Assessment & Plan:   Pure hypercholesterolemia - Plan: CBC with Differential/Platelet, Lipid panel, COMPLETE METABOLIC PANEL WITH GFR  Primary hypertension - Plan: CBC with Differential/Platelet, Lipid panel, COMPLETE METABOLIC PANEL WITH GFR Due to his bradycardia and relative hypotension on a hold the atenolol.  Continue losartan 100 mg daily.  Check CBC CMP and a lipid panel.  Ideally I like his LDL cholesterol to be below 100.  I will monitor his kidney function and his fasting blood sugar.  Have asked the patient to check his blood pressure and his heart rate at home to ensure that his blood pressure remains well controlled.  Offered the patient a flu shot he politely declined today

## 2022-04-02 ENCOUNTER — Other Ambulatory Visit: Payer: Self-pay | Admitting: Family Medicine

## 2022-04-02 LAB — COMPLETE METABOLIC PANEL WITH GFR
AG Ratio: 1.7 (calc) (ref 1.0–2.5)
ALT: 33 U/L (ref 9–46)
AST: 30 U/L (ref 10–35)
Albumin: 4.4 g/dL (ref 3.6–5.1)
Alkaline phosphatase (APISO): 69 U/L (ref 35–144)
BUN/Creatinine Ratio: 18 (calc) (ref 6–22)
BUN: 22 mg/dL (ref 7–25)
CO2: 24 mmol/L (ref 20–32)
Calcium: 9.4 mg/dL (ref 8.6–10.3)
Chloride: 106 mmol/L (ref 98–110)
Creat: 1.24 mg/dL — ABNORMAL HIGH (ref 0.70–1.22)
Globulin: 2.6 g/dL (calc) (ref 1.9–3.7)
Glucose, Bld: 84 mg/dL (ref 65–99)
Potassium: 4.6 mmol/L (ref 3.5–5.3)
Sodium: 139 mmol/L (ref 135–146)
Total Bilirubin: 0.8 mg/dL (ref 0.2–1.2)
Total Protein: 7 g/dL (ref 6.1–8.1)
eGFR: 59 mL/min/{1.73_m2} — ABNORMAL LOW (ref >=60)

## 2022-04-02 LAB — CBC WITH DIFFERENTIAL/PLATELET
Absolute Monocytes: 856 cells/uL (ref 200–950)
Basophils Absolute: 72 cells/uL (ref 0–200)
Basophils Relative: 0.9 %
Eosinophils Absolute: 88 cells/uL (ref 15–500)
Eosinophils Relative: 1.1 %
HCT: 40.1 % (ref 38.5–50.0)
Hemoglobin: 14.3 g/dL (ref 13.2–17.1)
Lymphs Abs: 2048 cells/uL (ref 850–3900)
MCH: 33.4 pg — ABNORMAL HIGH (ref 27.0–33.0)
MCHC: 35.7 g/dL (ref 32.0–36.0)
MCV: 93.7 fL (ref 80.0–100.0)
MPV: 10.5 fL (ref 7.5–12.5)
Monocytes Relative: 10.7 %
Neutro Abs: 4936 cells/uL (ref 1500–7800)
Neutrophils Relative %: 61.7 %
Platelets: 232 10*3/uL (ref 140–400)
RBC: 4.28 10*6/uL (ref 4.20–5.80)
RDW: 13.1 % (ref 11.0–15.0)
Total Lymphocyte: 25.6 %
WBC: 8 10*3/uL (ref 3.8–10.8)

## 2022-04-02 LAB — LIPID PANEL
Cholesterol: 200 mg/dL — ABNORMAL HIGH (ref <200)
HDL: 42 mg/dL (ref >=40)
LDL Cholesterol (Calc): 126 mg/dL (calc) — ABNORMAL HIGH
Non-HDL Cholesterol (Calc): 158 mg/dL (calc) — ABNORMAL HIGH (ref <130)
Total CHOL/HDL Ratio: 4.8 (calc) (ref <5.0)
Triglycerides: 205 mg/dL — ABNORMAL HIGH (ref <150)

## 2022-04-02 NOTE — Telephone Encounter (Signed)
Requested medication (s) are due for refill today: yes  Requested medication (s) are on the active medication list: yes  Last refill:  09/24/21 #90 1 refills   Future visit scheduled: yes in 6 months  Notes to clinic:  no valid encounter with in 6 months. Do you want to refill until next appt in 6 months?     Requested Prescriptions  Pending Prescriptions Disp Refills   losartan (COZAAR) 100 MG tablet [Pharmacy Med Name: Losartan Potassium 100 MG Oral Tablet] 90 tablet 0    Sig: Take 1 tablet by mouth once daily     Cardiovascular:  Angiotensin Receptor Blockers Failed - 04/02/2022  2:41 PM      Failed - Cr in normal range and within 180 days    Creat  Date Value Ref Range Status  04/01/2022 1.24 (H) 0.70 - 1.22 mg/dL Final         Failed - Valid encounter within last 6 months    Recent Outpatient Visits           6 months ago Cellulitis of left upper extremity   [place] Susy Frizzle, MD   1 year ago Bronchitis with acute wheezing   [place] Susy Frizzle, MD   1 year ago Tick bite of left back wall of thorax, initial encounter   [place] Eulogio Bear, NP   1 year ago Atopic dermatitis, unspecified type   [place] Pickard, Cammie Mcgee, MD   1 year ago Acute pain of right knee   [place] Susy Frizzle, MD       Future Appointments             In 6 months Pickard, Cammie Mcgee, MD [place], [facility]            Passed - K in normal range and within 180 days    Potassium  Date Value Ref Range Status  04/01/2022 4.6 3.5 - 5.3 mmol/L Final         Passed - Patient is not pregnant      Passed - Last BP in normal range    BP Readings from Last 1 Encounters:  04/01/22 118/76

## 2022-04-03 ENCOUNTER — Other Ambulatory Visit: Payer: Self-pay

## 2022-04-03 MED ORDER — LOSARTAN POTASSIUM 100 MG PO TABS: 100.0000 mg | ORAL_TABLET | Freq: Every day | ORAL | 1 refills | Status: DC

## 2022-04-15 ENCOUNTER — Other Ambulatory Visit: Payer: Self-pay | Admitting: Family Medicine

## 2022-04-15 MED ORDER — ROSUVASTATIN CALCIUM 10 MG PO TABS: 10.0000 mg | ORAL_TABLET | Freq: Every day | ORAL | 3 refills | Status: DC

## 2022-06-02 ENCOUNTER — Other Ambulatory Visit: Payer: Medicare Other

## 2022-10-02 ENCOUNTER — Ambulatory Visit: Payer: Medicare Other | Admitting: Family Medicine

## 2022-10-16 ENCOUNTER — Other Ambulatory Visit: Payer: Self-pay | Admitting: Family Medicine

## 2022-10-16 NOTE — Telephone Encounter (Signed)
Prescription Request  10/16/2022  LOV: 04/01/2022  What is the name of the medication or equipment? losartan (COZAAR) 100 MG tablet   Have you contacted your pharmacy to request a refill? Yes   Which pharmacy would you like this sent to?  [id number] - [place] ([place]), [place] - [address] [place] [address] Phone: [phone] Fax: [phone]    Patient notified that their request is being sent to the clinical staff for review and that they should receive a response within 2 business days.   Please advise at [facility] [phone]

## 2022-10-22 ENCOUNTER — Telehealth: Payer: Self-pay | Admitting: Family Medicine

## 2022-10-22 NOTE — Telephone Encounter (Signed)
Opened in error

## 2022-10-22 NOTE — Telephone Encounter (Signed)
Called patient to schedule Medicare Annual Wellness Visit (AWV). Left message for patient to call back and schedule Medicare Annual Wellness Visit (AWV).  10/22/22,10/30/21,10/10/21,09/09/21,08/22/21,08/07/21,07/22/21,06/26/21,05/21/21,12/20/2020   Last date of AWV: due 06/30/2009 [name] per [place]   Please schedule an appointment at any time with Vernona Rieger, [facility]. .  If any questions, please contact me at [phone].  Thank you,  Judeth Cornfield,  AMB Clinical Support [facility] AWV Program Direct Dial ??[phone]

## 2022-10-24 ENCOUNTER — Ambulatory Visit (INDEPENDENT_AMBULATORY_CARE_PROVIDER_SITE_OTHER): Payer: Medicare Other | Admitting: Family Medicine

## 2022-10-24 ENCOUNTER — Encounter: Payer: Self-pay | Admitting: Family Medicine

## 2022-10-24 VITALS — BP 132/78 | HR 60 | Ht 69.0 in | Wt 251.0 lb

## 2022-10-24 DIAGNOSIS — L209 Atopic dermatitis, unspecified: Secondary | ICD-10-CM

## 2022-10-24 DIAGNOSIS — I209 Angina pectoris, unspecified: Secondary | ICD-10-CM

## 2022-10-24 DIAGNOSIS — R079 Chest pain, unspecified: Secondary | ICD-10-CM | POA: Diagnosis not present

## 2022-10-24 MED ORDER — NITROGLYCERIN 0.4 MG SL SUBL: 0.4000 mg | SUBLINGUAL_TABLET | SUBLINGUAL | 3 refills | Status: DC | PRN

## 2022-10-24 MED ORDER — TRIAMCINOLONE ACETONIDE 0.1 % EX CREA: 1.0000 | TOPICAL_CREAM | Freq: Two times a day (BID) | CUTANEOUS | 0 refills | Status: DC

## 2022-10-24 NOTE — Progress Notes (Signed)
Subjective:    Patient ID: [name], male    DOB: 06/29/1941, 82 y.o.   MRN: [id number]   Patient recently admitted for a rash on his chest that consists of erythematous papules that are slightly itchy and irritated.  Each of the papules are poorly circumscribed without sharp borders.  The skin appears dry and irritated.  The rash is slightly itchy.  However he also reports chest pain.  He exercises 3 days a week and has been getting some left-sided chest pain that he describes as a pressure it can last minutes to an hour.  It tends to be associated with activity.  He is also been getting dyspnea on exertion.  I performed an EKG today that shows normal sinus rhythm with normal intervals and a normal axis although the patient have nonspecific ST depression in aVL.  The patient is asymptomatic today.  He denies any chest pain at the current current moment.  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 90 tablet 3   No current facility-administered medications on file prior to visit.   Allergies  Allergen Reactions   Amlodipine     Leg swelling   Red Dye Hives   Social History   Socioeconomic History   Marital status: Married    Spouse name: Not on file   Number of children: Not on file   Years of education: Not on file   Highest education level: Not on file  Occupational History   Not on file  Tobacco Use   Smoking  status: Former   Smokeless tobacco: Never  Substance and Sexual Activity   Alcohol use: No    Alcohol/week: 0.0 standard drinks of alcohol   Drug use: No   Sexual activity: Not Currently  Other Topics Concern   Not on file  Social History Narrative   Not on file   Social Determinants of Health   Financial Resource Strain: Not on file  Food Insecurity: Not on file  Transportation Needs: Not on file  Physical Activity: Not on file  Stress: Not on file  Social Connections: Not on file  Intimate Partner Violence: Not on file      Review of Systems  All other systems reviewed and are negative.      Objective:   Physical Exam Constitutional:      General: He is not in acute distress.    Appearance: Normal appearance. He is normal weight. He is not ill-appearing or toxic-appearing.  HENT:     Right Ear: Tympanic membrane and ear canal normal.     Left Ear: Tympanic membrane and ear canal normal.     Nose: Nose normal. No congestion or rhinorrhea.     Mouth/Throat:     Mouth: Mucous membranes are moist.     Pharynx: No oropharyngeal exudate or  posterior oropharyngeal erythema.  Eyes:     Conjunctiva/sclera: Conjunctivae normal.  Cardiovascular:     Rate and Rhythm: Normal rate and regular rhythm.     Heart sounds: Normal heart sounds. No murmur heard.    No friction rub. No gallop.  Pulmonary:     Effort: Pulmonary effort is normal.     Breath sounds: No wheezing, rhonchi or rales.  Abdominal:     General: Bowel sounds are normal. There is no distension.     Palpations: Abdomen is soft.     Tenderness: There is no abdominal tenderness. There is no guarding or rebound.  Musculoskeletal:     Cervical back: Neck supple.  Lymphadenopathy:     Cervical: No cervical adenopathy.  Skin:    Findings: No rash.  Neurological:     Mental Status: He is alert.           Assessment & Plan:  Chest pain, unspecified type - Plan: EKG 12-Lead  Angina pectoris (HCC) -  Plan: Ambulatory referral to Cardiology I believe the rash is atopic dermatitis and I will treat this with triamcinolone cream twice daily for 1 week however I am very concerned by his chest pain.  I believe that he is dealing with angina.  I have recommended no heavy lifting and no strenuous activity until seen by cardiology.  Also asked that he is taking aspirin 81 g daily.  I will give the patient nitroglycerin.  Use 1 tablet sublingual at the first sign of chest pain.  If not improving he is to contact 911.  If chest pain returns or intensifies he is to contact 911 and go to the emergency room.

## 2022-10-27 ENCOUNTER — Emergency Department (HOSPITAL_BASED_OUTPATIENT_CLINIC_OR_DEPARTMENT_OTHER): Payer: Medicare Other | Admitting: Radiology

## 2022-10-27 ENCOUNTER — Encounter (HOSPITAL_BASED_OUTPATIENT_CLINIC_OR_DEPARTMENT_OTHER): Payer: Self-pay | Admitting: Emergency Medicine

## 2022-10-27 ENCOUNTER — Other Ambulatory Visit: Payer: Self-pay

## 2022-10-27 ENCOUNTER — Inpatient Hospital Stay (HOSPITAL_BASED_OUTPATIENT_CLINIC_OR_DEPARTMENT_OTHER)
Admission: EM | Admit: 2022-10-27 | Discharge: 2022-11-06 | DRG: 233 | Disposition: A | Discharge status: Home / Self Care | Payer: Medicare Other | Attending: Thoracic Surgery (Cardiothoracic Vascular Surgery) | Admitting: Thoracic Surgery (Cardiothoracic Vascular Surgery)

## 2022-10-27 ENCOUNTER — Telehealth: Payer: Self-pay

## 2022-10-27 DIAGNOSIS — I129 Hypertensive chronic kidney disease with stage 1 through stage 4 chronic kidney disease, or unspecified chronic kidney disease: Secondary | ICD-10-CM | POA: Diagnosis present

## 2022-10-27 DIAGNOSIS — E785 Hyperlipidemia, unspecified: Secondary | ICD-10-CM | POA: Diagnosis present

## 2022-10-27 DIAGNOSIS — I9789 Other postprocedural complications and disorders of the circulatory system, not elsewhere classified: Secondary | ICD-10-CM | POA: Diagnosis not present

## 2022-10-27 DIAGNOSIS — E7849 Other hyperlipidemia: Secondary | ICD-10-CM

## 2022-10-27 DIAGNOSIS — R001 Bradycardia, unspecified: Secondary | ICD-10-CM | POA: Diagnosis present

## 2022-10-27 DIAGNOSIS — Z8669 Personal history of other diseases of the nervous system and sense organs: Secondary | ICD-10-CM

## 2022-10-27 DIAGNOSIS — I480 Paroxysmal atrial fibrillation: Secondary | ICD-10-CM | POA: Diagnosis present

## 2022-10-27 DIAGNOSIS — I25110 Atherosclerotic heart disease of native coronary artery with unstable angina pectoris: Secondary | ICD-10-CM | POA: Diagnosis present

## 2022-10-27 DIAGNOSIS — Z87891 Personal history of nicotine dependence: Secondary | ICD-10-CM | POA: Diagnosis not present

## 2022-10-27 DIAGNOSIS — R21 Rash and other nonspecific skin eruption: Secondary | ICD-10-CM | POA: Diagnosis present

## 2022-10-27 DIAGNOSIS — E8770 Fluid overload, unspecified: Secondary | ICD-10-CM | POA: Diagnosis not present

## 2022-10-27 DIAGNOSIS — Z888 Allergy status to other drugs, medicaments and biological substances status: Secondary | ICD-10-CM

## 2022-10-27 DIAGNOSIS — I493 Ventricular premature depolarization: Secondary | ICD-10-CM | POA: Diagnosis present

## 2022-10-27 DIAGNOSIS — I200 Unstable angina: Secondary | ICD-10-CM | POA: Diagnosis present

## 2022-10-27 DIAGNOSIS — Z91041 Radiographic dye allergy status: Secondary | ICD-10-CM | POA: Diagnosis not present

## 2022-10-27 DIAGNOSIS — J9811 Atelectasis: Secondary | ICD-10-CM | POA: Diagnosis not present

## 2022-10-27 DIAGNOSIS — R079 Chest pain, unspecified: Secondary | ICD-10-CM | POA: Diagnosis present

## 2022-10-27 DIAGNOSIS — N179 Acute kidney failure, unspecified: Secondary | ICD-10-CM | POA: Diagnosis not present

## 2022-10-27 DIAGNOSIS — Z8546 Personal history of malignant neoplasm of prostate: Secondary | ICD-10-CM

## 2022-10-27 DIAGNOSIS — K5900 Constipation, unspecified: Secondary | ICD-10-CM | POA: Diagnosis not present

## 2022-10-27 DIAGNOSIS — I2510 Atherosclerotic heart disease of native coronary artery without angina pectoris: Secondary | ICD-10-CM | POA: Diagnosis not present

## 2022-10-27 DIAGNOSIS — Z1152 Encounter for screening for COVID-19: Secondary | ICD-10-CM

## 2022-10-27 DIAGNOSIS — I214 Non-ST elevation (NSTEMI) myocardial infarction: Principal | ICD-10-CM | POA: Diagnosis present

## 2022-10-27 DIAGNOSIS — Z79899 Other long term (current) drug therapy: Secondary | ICD-10-CM

## 2022-10-27 DIAGNOSIS — Z902 Acquired absence of lung [part of]: Secondary | ICD-10-CM

## 2022-10-27 DIAGNOSIS — I10 Essential (primary) hypertension: Secondary | ICD-10-CM | POA: Diagnosis not present

## 2022-10-27 DIAGNOSIS — Z01810 Encounter for preprocedural cardiovascular examination: Secondary | ICD-10-CM | POA: Diagnosis not present

## 2022-10-27 DIAGNOSIS — N183 Chronic kidney disease, stage 3 unspecified: Secondary | ICD-10-CM | POA: Diagnosis present

## 2022-10-27 DIAGNOSIS — Z951 Presence of aortocoronary bypass graft: Secondary | ICD-10-CM | POA: Diagnosis not present

## 2022-10-27 DIAGNOSIS — Z85118 Personal history of other malignant neoplasm of bronchus and lung: Secondary | ICD-10-CM | POA: Diagnosis not present

## 2022-10-27 DIAGNOSIS — Z8249 Family history of ischemic heart disease and other diseases of the circulatory system: Secondary | ICD-10-CM | POA: Diagnosis not present

## 2022-10-27 DIAGNOSIS — R931 Abnormal findings on diagnostic imaging of heart and coronary circulation: Secondary | ICD-10-CM | POA: Diagnosis not present

## 2022-10-27 DIAGNOSIS — R008 Other abnormalities of heart beat: Secondary | ICD-10-CM | POA: Diagnosis present

## 2022-10-27 LAB — BASIC METABOLIC PANEL
Anion gap: 10 (ref 5–15)
BUN: 23 mg/dL (ref 8–23)
CO2: 24 mmol/L (ref 22–32)
Calcium: 9.3 mg/dL (ref 8.9–10.3)
Chloride: 103 mmol/L (ref 98–111)
Creatinine, Ser: 1.16 mg/dL (ref 0.61–1.24)
GFR, Estimated: >60 mL/min (ref >60)
Glucose, Bld: 102 mg/dL — ABNORMAL HIGH (ref 70–99)
Potassium: 4.5 mmol/L (ref 3.5–5.1)
Sodium: 137 mmol/L (ref 135–145)

## 2022-10-27 LAB — CBC
HCT: 40.2 % (ref 39.0–52.0)
Hemoglobin: 14.1 g/dL (ref 13.0–17.0)
MCH: 32.7 pg (ref 26.0–34.0)
MCHC: 35.1 g/dL (ref 30.0–36.0)
MCV: 93.3 fL (ref 80.0–100.0)
Platelets: 211 10*3/uL (ref 150–400)
RBC: 4.31 MIL/uL (ref 4.22–5.81)
RDW: 13.4 % (ref 11.5–15.5)
WBC: 7.6 10*3/uL (ref 4.0–10.5)
nRBC: 0 % (ref 0.0–0.2)

## 2022-10-27 LAB — HEPATIC FUNCTION PANEL
ALT: 55 U/L — ABNORMAL HIGH (ref 0–44)
AST: 52 U/L — ABNORMAL HIGH (ref 15–41)
Albumin: 4.2 g/dL (ref 3.5–5.0)
Alkaline Phosphatase: 75 U/L (ref 38–126)
Bilirubin, Direct: 0.1 mg/dL (ref 0.0–0.2)
Indirect Bilirubin: 1 mg/dL — ABNORMAL HIGH (ref 0.3–0.9)
Total Bilirubin: 1.1 mg/dL (ref 0.3–1.2)
Total Protein: 7.3 g/dL (ref 6.5–8.1)

## 2022-10-27 LAB — TROPONIN I (HIGH SENSITIVITY)
Troponin I (High Sensitivity): 84 ng/L — ABNORMAL HIGH (ref <18)
Troponin I (High Sensitivity): 71 ng/L — ABNORMAL HIGH (ref <18)
Troponin I (High Sensitivity): 92 ng/L — ABNORMAL HIGH (ref <18)
Troponin I (High Sensitivity): 93 ng/L — ABNORMAL HIGH (ref <18)

## 2022-10-27 LAB — HEMOGLOBIN A1C
Hgb A1c MFr Bld: 5.9 % — ABNORMAL HIGH (ref 4.8–5.6)
Mean Plasma Glucose: 122.63 mg/dL

## 2022-10-27 LAB — T4, FREE: Free T4: 0.71 ng/dL (ref 0.61–1.12)

## 2022-10-27 LAB — TSH: TSH: 3.29 u[IU]/mL (ref 0.350–4.500)

## 2022-10-27 LAB — BRAIN NATRIURETIC PEPTIDE: B Natriuretic Peptide: 36.2 pg/mL (ref 0.0–100.0)

## 2022-10-27 LAB — MAGNESIUM: Magnesium: 2.2 mg/dL (ref 1.7–2.4)

## 2022-10-27 MED ORDER — NITROGLYCERIN 0.4 MG SL SUBL: 0.4000 mg | SUBLINGUAL_TABLET | SUBLINGUAL | Status: DC | PRN

## 2022-10-27 MED ORDER — ONDANSETRON HCL 4 MG/2ML IJ SOLN: 4.0000 mg | Freq: Four times a day (QID) | INTRAMUSCULAR | Status: DC | PRN

## 2022-10-27 MED ORDER — HEPARIN BOLUS VIA INFUSION
4000.0000 [IU] | Freq: Once | INTRAVENOUS | Status: AC
  Administered 2022-10-27: 4000 [IU] via INTRAVENOUS

## 2022-10-27 MED ORDER — NITROGLYCERIN IN D5W 200-5 MCG/ML-% IV SOLN
0.0000 ug/min | INTRAVENOUS | Status: DC
  Administered 2022-10-27: 5 ug/min via INTRAVENOUS
  Filled 2022-10-27: qty 250

## 2022-10-27 MED ORDER — ASPIRIN 81 MG PO CHEW
324.0000 mg | CHEWABLE_TABLET | Freq: Once | ORAL | Status: AC
  Administered 2022-10-27: 324 mg via ORAL
  Filled 2022-10-27: qty 4

## 2022-10-27 MED ORDER — ASPIRIN 300 MG RE SUPP
300.0000 mg | RECTAL | Status: DC
  Filled 2022-10-27: qty 1

## 2022-10-27 MED ORDER — ASPIRIN 81 MG PO CHEW: 324.0000 mg | CHEWABLE_TABLET | ORAL | Status: DC

## 2022-10-27 MED ORDER — ASPIRIN 81 MG PO TBEC
81.0000 mg | DELAYED_RELEASE_TABLET | Freq: Every day | ORAL | Status: DC
  Administered 2022-10-28 – 2022-10-30 (×2): 81 mg via ORAL
  Filled 2022-10-27 (×3): qty 1

## 2022-10-27 MED ORDER — ROSUVASTATIN CALCIUM 20 MG PO TABS
40.0000 mg | ORAL_TABLET | Freq: Every day | ORAL | Status: DC
  Administered 2022-10-28 – 2022-10-30 (×3): 40 mg via ORAL
  Filled 2022-10-27 (×3): qty 2

## 2022-10-27 MED ORDER — HEPARIN (PORCINE) 25000 UT/250ML-% IV SOLN
1400.0000 [IU]/h | INTRAVENOUS | Status: DC
  Administered 2022-10-27: 1200 [IU]/h via INTRAVENOUS
  Administered 2022-10-28 – 2022-10-29 (×2): 1400 [IU]/h via INTRAVENOUS
  Filled 2022-10-27 (×3): qty 250

## 2022-10-27 MED ORDER — NITROGLYCERIN 2 % TD OINT
1.0000 [in_us] | TOPICAL_OINTMENT | Freq: Four times a day (QID) | TRANSDERMAL | Status: DC
  Administered 2022-10-28 – 2022-10-30 (×9): 1 [in_us] via TOPICAL
  Filled 2022-10-27 (×9): qty 1

## 2022-10-27 MED ORDER — LOSARTAN POTASSIUM 50 MG PO TABS
100.0000 mg | ORAL_TABLET | Freq: Every day | ORAL | Status: DC
  Administered 2022-10-27: 100 mg via ORAL
  Filled 2022-10-27: qty 2

## 2022-10-27 MED ORDER — SODIUM CHLORIDE 0.9 % IV SOLN: INTRAVENOUS | Status: DC

## 2022-10-27 MED ORDER — METOPROLOL TARTRATE 12.5 MG HALF TABLET
12.5000 mg | ORAL_TABLET | Freq: Two times a day (BID) | ORAL | Status: DC
  Administered 2022-10-27: 12.5 mg via ORAL
  Filled 2022-10-27: qty 1

## 2022-10-27 MED ORDER — ACETAMINOPHEN 325 MG PO TABS: 650.0000 mg | ORAL_TABLET | ORAL | Status: DC | PRN

## 2022-10-27 NOTE — Progress Notes (Signed)
ANTICOAGULATION CONSULT NOTE - Initial Consult  Pharmacy Consult for Heparin Indication: chest pain/ACS  Allergies  Allergen Reactions   Amlodipine     Leg swelling   Red Dye Hives    Patient Measurements:   Heparin Dosing Weight: 96 kg  Vital Signs: Temp: 98 F (36.7 C) (04/29 1329) BP: 140/86 (04/29 1445) Pulse Rate: 78 (04/29 1445)  Labs: Recent Labs    10/27/22 1329 10/27/22 1533  HGB 14.1  --   HCT 40.2  --   PLT 211  --   CREATININE 1.16  --   TROPONINIHS 84* 71*    Estimated Creatinine Clearance: 63.2 mL/min (by C-G formula based on SCr of 1.16 mg/dL).   Medical History: Past Medical History:  Diagnosis Date   Brain tumor (HCC)    Cancer [facility])    prostate s/p cryoablation at [facility] 2/13, Gleason 7 of left lateral base   HLD (hyperlipidemia)    Hypertension     Medications:  (Not in a hospital admission)  Scheduled:  Infusions:  PRN:   Assessment: 81 yom with a history of HTN, HLD . Patient is presenting with chest pain. Heparin per pharmacy consult placed for chest pain/ACS.  Patient is not on anticoagulation prior to arrival.  Hgb 14.1; plt 211  Goal of Therapy:  Heparin level 0.3-0.7 units/ml Monitor platelets by anticoagulation protocol: Yes   Plan:  Give IV heparin 4000 units bolus x 1 Start heparin infusion at 1200 units/hr Check anti-Xa level in 8 hours and daily while on heparin Continue to monitor H&H and platelets  Delmar Landau, PharmD, BCPS 10/27/2022 4:20 PM ED Clinical Pharmacist -  [phone]

## 2022-10-27 NOTE — ED Triage Notes (Signed)
Pt arrives to ED with c/o intermittent chest pressure with associated nausea x1 week.

## 2022-10-27 NOTE — H&P (Addendum)
Cardiology Admission History and Physical   Patient ID: [name] MRN: [id number]; DOB: 07/21/1940   Admission date: 10/27/2022  PCP:  Vernon Brooks, MD   [facility] Providers Cardiologist:  None        Chief Complaint:  chest pain  Patient Profile:   [name] is a 82 y.o. male with prostate cancer, HTN, HLD and brain tumor with surgery who is being seen 10/27/2022 for the evaluation of chest pain.  History of Present Illness:   [name] with no prior cardiac hx and hx as above was seen by PCP 10/24/22 with complaints of chest pain.  With exercise he has left sided chest  pain with his exercise recently.a pressure. It lasts minutes to an hour.  Associated with DOE and DOE without the discomfort.  EKG on the 26th was stable. He was referred to cardiology and ASA 81 mg daily added. Prn sl NTG given to pt as well.    Today he had increased chest pain and some nausea felt the pain was worse lying flat.  His father did have MI in his 23s  he presented to ER  asa 324 given in ER and IV heparin.  EKG without acute changes 2V CXR  NAD Na 137 K+ 4.5 BUN 23 Cr 1.16  BNP 36.2 Hs troponin 84 and 71  Hgb 14.1 pts 211   Pt transferred to [facility] for NSTEMI  Still with mild discomfort on arrival IV NTG started BP 170/84 P 83 R 18 afebrile   Past Medical History:  Diagnosis Date   Brain tumor (HCC)    Cancer (HCC)    prostate s/p cryoablation at [facility] 2/13, Gleason 7 of left lateral base   HLD (hyperlipidemia)    Hypertension     Past Surgical History:  Procedure Laterality Date   APPENDECTOMY     BRAIN SURGERY       Medications Prior to Admission: Prior to Admission medications   Medication Sig Start Date End Date Taking? Authorizing Provider  losartan (COZAAR) 100 MG tablet Take 1 tablet by mouth once daily 10/16/22   Vernon Brooks, MD  nitroGLYCERIN (NITROSTAT) 0.4 MG SL tablet Place 1 tablet (0.4 mg total) under the tongue every 5 (five)  minutes as needed for chest pain. 10/24/22   Vernon Brooks, MD  rosuvastatin (CRESTOR) 10 MG tablet Take 1 tablet (10 mg total) by mouth daily. 04/15/22   Vernon Brooks, MD  triamcinolone cream (KENALOG) 0.1 % Apply 1 Application topically 2 (two) times daily. 10/24/22   Vernon Brooks, MD     Allergies:    Allergies  Allergen Reactions   Amlodipine     Leg swelling   Red Dye Hives    Social History:   Social History   Socioeconomic History   Marital status: Married    Spouse name: Not on file   Number of children: Not on file   Years of education: Not on file   Highest education level: Not on file  Occupational History   Not on file  Tobacco Use   Smoking status: Former   Smokeless tobacco: Never  Substance and Sexual Activity   Alcohol use: No    Alcohol/week: 0.0 standard drinks of alcohol   Drug use: No   Sexual activity: Not Currently  Other Topics Concern   Not on file  Social History Narrative   Not on file   Social Determinants of Health  Financial Resource Strain: Not on file  Food Insecurity: Not on file  Transportation Needs: Not on file  Physical Activity: Not on file  Stress: Not on file  Social Connections: Not on file  Intimate Partner Violence: Not on file    Family History:   The patient's family history is not on file.  Father wit MI in his 19s   ROS:  Please see the history of present illness.  General:no colds or fevers, no weight changes Skin:no rashes or ulcers HEENT:no blurred vision, no congestion CV:see HPI PUL:see HPI GI:no diarrhea constipation or melena, no indigestion GU:no hematuria, no dysuria MS:no joint pain, no claudication Neuro:no syncope, no lightheadedness Endo:no diabetes, no thyroid disease All other ROS reviewed and negative.     Physical Exam/Data:   Vitals:   10/27/22 1445 10/27/22 1620 10/27/22 1653 10/27/22 1816  BP: (!) 140/86   (!) 170/84  Pulse: 78     Resp: 19   18  Temp:   98.4 F (36.9  C) 97.8 F (36.6 C)  TempSrc:    Oral  SpO2: 95%     Weight:  113.9 kg    Height:  5\' 9"  (1.753 m)     No intake or output data in the 24 hours ending 10/27/22 1945    10/27/2022    4:20 PM 10/24/2022   10:56 AM 04/01/2022   12:29 PM  Last 3 Weights  Weight (lbs) 251 lb 1.7 oz 251 lb 243 lb  Weight (kg) 113.9 kg 113.853 kg 110.224 kg     Body mass index is 37.08 kg/m.  Exam by Dr. Jenene Slicker    EKG:  The ECG that was done SR no acute ST changes was personally reviewed   Relevant CV Studies: none  Laboratory Data:  High Sensitivity Troponin:   Recent Labs  Lab 10/27/22 1329 10/27/22 1533  TROPONINIHS 84* 71*      Chemistry Recent Labs  Lab 10/27/22 1329  NA 137  K 4.5  CL 103  CO2 24  GLUCOSE 102*  BUN 23  CREATININE 1.16  CALCIUM 9.3  GFRNONAA >60  ANIONGAP 10    No results for input(s): "PROT", "ALBUMIN", "AST", "ALT", "ALKPHOS", "BILITOT" in the last 168 hours. Lipids No results for input(s): "CHOL", "TRIG", "HDL", "LABVLDL", "LDLCALC", "CHOLHDL" in the last 168 hours. Hematology Recent Labs  Lab 10/27/22 1329  WBC 7.6  RBC 4.31  HGB 14.1  HCT 40.2  MCV 93.3  MCH 32.7  MCHC 35.1  RDW 13.4  PLT 211   Thyroid No results for input(s): "TSH", "FREET4" in the last 168 hours. BNP Recent Labs  Lab 10/27/22 1329  BNP 36.2    DDimer No results for input(s): "DDIMER" in the last 168 hours.   Radiology/Studies:  DG Chest 2 View  Result Date: 10/27/2022 CLINICAL DATA:  Chest pain.  Intermittent chest pressure. EXAM: CHEST - 2 VIEW COMPARISON:  Chest radiographs 07/31/2007 FINDINGS: Cardiac silhouette is at the upper limits of normal size. There is again a tortuous ascending and descending thoracic aorta. Focal calcific density overlying the inferior right lung, likely benign calcified granuloma. No focal airspace opacity. No pleural effusion or pneumothorax. Moderate multilevel degenerative disc changes of the thoracic spine. IMPRESSION: No active  cardiopulmonary disease. Electronically Signed   By: Neita Garnet M.D.   On: 10/27/2022 14:03     Assessment and Plan:   Unstable angina/NSTEMI  chest pain present with exercise until today and at rest.  Seems worse with  lying flat.  Elevated troponin.  Will need echo possible cardiac cath IV heparin continue and IV NTG  HTN elevated now added IV NTG  HLD check lipids add high dose statin    Risk Assessment/Risk Scores:    TIMI Risk Score for Unstable Angina or Non-ST Elevation MI:   The patient's TIMI risk score is 5, which indicates a 26% risk of all cause mortality, new or recurrent myocardial infarction or need for urgent revascularization in the next 14 days.       Severity of Illness: The appropriate patient status for this patient is INPATIENT. Inpatient status is judged to be reasonable and necessary in order to provide the required intensity of service to ensure the patient's safety. The patient's presenting symptoms, physical exam findings, and initial radiographic and laboratory data in the context of their chronic comorbidities is felt to place them at high risk for further clinical deterioration. Furthermore, it is not anticipated that the patient will be medically stable for discharge from the hospital within 2 midnights of admission.   * I certify that at the point of admission it is my clinical judgment that the patient will require inpatient hospital care spanning beyond 2 midnights from the point of admission due to high intensity of service, high risk for further deterioration and high frequency of surveillance required.*   For questions or updates, please contact [facility] Please consult [email or web address] for contact info under     Signed, Nada Boozer, NP  10/27/2022 7:45 PM     Attending attestation  Patient seen and independently examined with Nada Boozer, NP. We discussed all aspects of the encounter. I agree with the assessment and plan as stated  above.  Patient is a 82 year old M known to have HTN, HLD, prostate cancer, brain tumor with surgery presented to the ER with chest pain. He has been having almost daily chest tightness for 2 weeks, lasts for the whole day, but he can still sleep through the chest tightness. The severity was 4/10 in intensity and was never more than that.  He continues to have chest tightness after arrival to the ER and also during my interview.  Denies any other symptoms of DOE, dizziness, lightheadedness, syncope, palpitations.  EKG showed NSR with no ischemic changes.  High sensitive troponins were mildly elevated.  Denies smoking cigarettes.  Physical examination is remarkable for patient not in acute respiratory distress, HEENT normal, no JVD, S1-S2 normal, no murmur, clear lungs, nondistended and nontender abdomen, no pitting edema in bilateral lower extremities.  # Chest pain, rule out ACS: Chest pain history is not convincing for unstable angina (no EKG changes although there is mild elevation of troponins.  He has continued 4/10 intermittent chest tightness at rest and exertion for the last 2 weeks and present during my interview. He appears comfortable.  Will obtain CTA cardiac tomorrow a.m. Will stop heparin drip if no significant CAD on the CT cardiac. Start nitro paste.  # HTN: Hold losartan due to CT cardiac tomorrow # HLD: Continue rosuvastatin 10 mg nightly  I have spent a total of 75 minutes with patient reviewing chart , telemetry, EKGs, labs and examining patient as well as establishing an assessment and plan that was discussed with the patient.  > 50% of time was spent in direct patient care.

## 2022-10-27 NOTE — Telephone Encounter (Signed)
Pt came by office with increasing chest pain. Advised patient to go to ER for evaluation. Pt verbalized understanding and given directions to [facility]. Thank you.

## 2022-10-27 NOTE — ED Provider Notes (Signed)
[place] [facility]ERGENCY DEPARTMENT AT [facility] Provider Note   CSN: [id number] Arrival date & time: 10/27/22  1320     History  Chief Complaint  Patient presents with   Chest Pain    [name] is a 82 y.o. male.  82 yo M with a cc of chest pain.  Going on for the past week.  Seems to come and go.  Had an episode that occurred at exercise.  Pain about the center and left of his chest.  Went to see his PCP.  Started on nitro told to stop exercising.  Worsening pain this morning.  Feels like it may be worse with lying flat. Feels like he gets redness in his upper chest with lying flat.  Denies leg swelling.   Patient denies history of MI, denies diabetes or smoking.  Hx of HTN, HLD. Father had MI in 43's.   Patient denies history of PE or DVT denies hemoptysis denies unilateral lower extremity edema denies recent surgery immobilization hospitalization estrogen.  Remote hx of prostate cancer.    Chest Pain      Home Medications Prior to Admission medications   Medication Sig Start Date End Date Taking? Authorizing Provider  losartan (COZAAR) 100 MG tablet Take 1 tablet by mouth once daily 10/16/22   Donita Brooks, MD  nitroGLYCERIN (NITROSTAT) 0.4 MG SL tablet Place 1 tablet (0.4 mg total) under the tongue every 5 (five) minutes as needed for chest pain. 10/24/22   Donita Brooks, MD  rosuvastatin (CRESTOR) 10 MG tablet Take 1 tablet (10 mg total) by mouth daily. 04/15/22   Donita Brooks, MD  triamcinolone cream (KENALOG) 0.1 % Apply 1 Application topically 2 (two) times daily. 10/24/22   Donita Brooks, MD      Allergies    Amlodipine and Red dye    Review of Systems   Review of Systems  Cardiovascular:  Positive for chest pain.    Physical Exam Updated Vital Signs BP (!) 140/86   Pulse 78   Temp 98 F (36.7 C)   Resp 19   SpO2 95%  Physical Exam Vitals and nursing note reviewed.  Constitutional:      Appearance: He is well-developed.   HENT:     Head: Normocephalic and atraumatic.  Eyes:     Pupils: Pupils are equal, round, and reactive to light.  Neck:     Vascular: No JVD.  Cardiovascular:     Rate and Rhythm: Normal rate and regular rhythm.     Heart sounds: No murmur heard.    No friction rub. No gallop.  Pulmonary:     Effort: No respiratory distress.     Breath sounds: No wheezing.  Abdominal:     General: There is no distension.     Tenderness: There is no abdominal tenderness. There is no guarding or rebound.  Musculoskeletal:        General: Normal range of motion.     Cervical back: Normal range of motion and neck supple.  Skin:    Coloration: Skin is not pale.     Findings: No rash.  Neurological:     Mental Status: He is alert and oriented to person, place, and time.  Psychiatric:        Behavior: Behavior normal.     ED Results / Procedures / Treatments   Labs (all labs ordered are listed, but only abnormal results are displayed) Labs Reviewed  BASIC METABOLIC PANEL -  Abnormal; Notable for the following components:      Result Value   Glucose, Bld 102 (*)    All other components within normal limits  TROPONIN I (HIGH SENSITIVITY) - Abnormal; Notable for the following components:   Troponin I (High Sensitivity) 84 (*)    All other components within normal limits  CBC  BRAIN NATRIURETIC PEPTIDE  TROPONIN I (HIGH SENSITIVITY)    EKG EKG Interpretation  Date/Time:  Monday October 27 2022 13:26:31 EDT Ventricular Rate:  99 PR Interval:  182 QRS Duration: 86 QT Interval:  338 QTC Calculation: 433 R Axis:   266 Text Interpretation: Normal sinus rhythm Right superior axis deviation Pulmonary disease pattern Septal infarct , age undetermined Abnormal ECG No old tracing to compare Confirmed by Melene Plan [phone] on 10/27/2022 3:04:30 PM  Radiology DG Chest 2 View  Result Date: 10/27/2022 CLINICAL DATA:  Chest pain.  Intermittent chest pressure. EXAM: CHEST - 2 VIEW COMPARISON:  Chest  radiographs 07/31/2007 FINDINGS: Cardiac silhouette is at the upper limits of normal size. There is again a tortuous ascending and descending thoracic aorta. Focal calcific density overlying the inferior right lung, likely benign calcified granuloma. No focal airspace opacity. No pleural effusion or pneumothorax. Moderate multilevel degenerative disc changes of the thoracic spine. IMPRESSION: No active cardiopulmonary disease. Electronically Signed   By: Neita Garnet M.D.   On: 10/27/2022 14:03    Procedures .Critical Care  Performed by: Melene Plan, DO Authorized by: Melene Plan, DO   Critical care provider statement:    Critical care time (minutes):  35   Critical care time was exclusive of:  Separately billable procedures and treating other patients   Critical care was time spent personally by me on the following activities:  Development of treatment plan with patient or surrogate, discussions with consultants, evaluation of patient's response to treatment, examination of patient, ordering and review of laboratory studies, ordering and review of radiographic studies, ordering and performing treatments and interventions, pulse oximetry, re-evaluation of patient's condition and review of old charts   Care discussed with: admitting provider       Medications Ordered in ED Medications  aspirin chewable tablet 324 mg (has no administration in time range)    ED Course/ Medical Decision Making/ A&P                             Medical Decision Making Amount and/or Complexity of Data Reviewed Labs: ordered. Radiology: ordered.  Risk OTC drugs.   82 yo M with a chief complaint of chest pain.  Patient has had symptoms off and on for week.  Feels like pressure worse in the center and left side of the chest.  Had 1 episode that got much worse during exercise.  Saw his family doctor for this and was told to take it easy.  He had worsening discomforts and noticed that this morning and called his  doctor who encouraged him to come to the hospital for evaluation.  Initial troponin here is positive.  I discussed this with Dr. Mayford Knife, cardiology recommended heparin infusion.  Admission.    The patients results and plan were reviewed and discussed.   Any x-rays performed were independently reviewed by myself.   Differential diagnosis were considered with the presenting HPI.  Medications  aspirin chewable tablet 324 mg (has no administration in time range)    Vitals:   10/27/22 1329 10/27/22 1445  BP: (!) 157/83 Marland Kitchen)  140/86  Pulse: 94 78  Resp: 16 19  Temp: 98 F (36.7 C)   SpO2: 97% 95%    Final diagnoses:  NSTEMI (non-ST elevated myocardial infarction) [facility])    Admission/ observation were discussed with the admitting physician, patient and/or family and they are comfortable with the plan.          Final Clinical Impression(s) / ED Diagnoses Final diagnoses:  NSTEMI (non-ST elevated myocardial infarction) [facility])    Rx / DC Orders ED Discharge Orders     None         Melene Plan, DO 10/27/22 1602

## 2022-10-28 ENCOUNTER — Inpatient Hospital Stay (HOSPITAL_COMMUNITY)
Admit: 2022-10-28 | Discharge: 2022-10-28 | Disposition: A | Discharge status: Home / Self Care | Payer: Medicare Other | Attending: Cardiology | Admitting: Cardiology

## 2022-10-28 ENCOUNTER — Other Ambulatory Visit: Payer: Self-pay | Admitting: Cardiology

## 2022-10-28 ENCOUNTER — Inpatient Hospital Stay (HOSPITAL_COMMUNITY): Payer: Medicare Other

## 2022-10-28 DIAGNOSIS — R079 Chest pain, unspecified: Secondary | ICD-10-CM | POA: Diagnosis not present

## 2022-10-28 DIAGNOSIS — I2510 Atherosclerotic heart disease of native coronary artery without angina pectoris: Secondary | ICD-10-CM | POA: Diagnosis not present

## 2022-10-28 DIAGNOSIS — R931 Abnormal findings on diagnostic imaging of heart and coronary circulation: Secondary | ICD-10-CM

## 2022-10-28 LAB — LIPID PANEL
Cholesterol: 111 mg/dL (ref 0–200)
HDL: 43 mg/dL (ref >40)
LDL Cholesterol: 51 mg/dL (ref 0–99)
Total CHOL/HDL Ratio: 2.6 RATIO
Triglycerides: 85 mg/dL (ref <150)
VLDL: 17 mg/dL (ref 0–40)

## 2022-10-28 LAB — CBC
HCT: 36.4 % — ABNORMAL LOW (ref 39.0–52.0)
Hemoglobin: 12.6 g/dL — ABNORMAL LOW (ref 13.0–17.0)
MCH: 32.3 pg (ref 26.0–34.0)
MCHC: 34.6 g/dL (ref 30.0–36.0)
MCV: 93.3 fL (ref 80.0–100.0)
Platelets: 197 10*3/uL (ref 150–400)
RBC: 3.9 MIL/uL — ABNORMAL LOW (ref 4.22–5.81)
RDW: 13.2 % (ref 11.5–15.5)
WBC: 6.6 10*3/uL (ref 4.0–10.5)
nRBC: 0 % (ref 0.0–0.2)

## 2022-10-28 LAB — BASIC METABOLIC PANEL
Anion gap: 7 (ref 5–15)
BUN: 24 mg/dL — ABNORMAL HIGH (ref 8–23)
CO2: 24 mmol/L (ref 22–32)
Calcium: 8.5 mg/dL — ABNORMAL LOW (ref 8.9–10.3)
Chloride: 105 mmol/L (ref 98–111)
Creatinine, Ser: 1.28 mg/dL — ABNORMAL HIGH (ref 0.61–1.24)
GFR, Estimated: 57 mL/min — ABNORMAL LOW (ref >60)
Glucose, Bld: 113 mg/dL — ABNORMAL HIGH (ref 70–99)
Potassium: 3.8 mmol/L (ref 3.5–5.1)
Sodium: 136 mmol/L (ref 135–145)

## 2022-10-28 LAB — HEPARIN LEVEL (UNFRACTIONATED)
Heparin Unfractionated: 0.26 IU/mL — ABNORMAL LOW (ref 0.30–0.70)
Heparin Unfractionated: 0.6 IU/mL (ref 0.30–0.70)

## 2022-10-28 LAB — ECHOCARDIOGRAM COMPLETE
Area-P 1/2: 3.89 cm2
Height: 69 in
S' Lateral: 3.6 cm
Weight: 3922.42 oz

## 2022-10-28 MED ORDER — LOSARTAN POTASSIUM 50 MG PO TABS
100.0000 mg | ORAL_TABLET | Freq: Every day | ORAL | Status: DC
  Administered 2022-10-28 – 2022-10-30 (×3): 100 mg via ORAL
  Filled 2022-10-28 (×3): qty 2

## 2022-10-28 MED ORDER — IOHEXOL 350 MG/ML SOLN
95.0000 mL | Freq: Once | INTRAVENOUS | Status: AC | PRN
  Administered 2022-10-28: 95 mL via INTRAVENOUS

## 2022-10-28 MED ORDER — LACTATED RINGERS IV SOLN: INTRAVENOUS | Status: AC

## 2022-10-28 MED ORDER — NITROGLYCERIN 0.4 MG SL SUBL
SUBLINGUAL_TABLET | SUBLINGUAL | Status: AC
  Filled 2022-10-28: qty 2

## 2022-10-28 NOTE — Progress Notes (Addendum)
Rounding Note    Patient Name: [name] Date of Encounter: 10/28/2022  [facility] Cardiologist: New to Dr Jenene Slicker  Subjective   He is ambulating in the room this morning, states he is feeling OK, no chest pain at this time. He states he had ongoing chest heaviness with exertion for 2 weeks, was doing weights in the gym and had chest pressure feeling. He denied any rapid weight gain, leg edema. He states he has no known allergy to iodine contrast but is allergic to red dye and "almost anything". He does not smoke.   Inpatient Medications    Scheduled Meds:  aspirin  324 mg Oral NOW   Or   aspirin  300 mg Rectal NOW   aspirin EC  81 mg Oral Daily   losartan  100 mg Oral Daily   nitroGLYCERIN  1 inch Topical Q6H   rosuvastatin  40 mg Oral Daily   Continuous Infusions:  sodium chloride 10 mL/hr at 10/28/22 0018   heparin 1,400 Units/hr (10/28/22 0542)   lactated ringers     PRN Meds: acetaminophen, nitroGLYCERIN, ondansetron (ZOFRAN) IV   Vital Signs    Vitals:   10/27/22 2040 10/27/22 2344 10/28/22 0535 10/28/22 0552  BP:  121/62 112/69   Pulse:  (!) 51 (!) 47   Resp:  18 18   Temp:  98.1 F (36.7 C) 97.9 F (36.6 C)   TempSrc:  Oral Oral   SpO2: 96% 98% 96%   Weight:    111.2 kg  Height:        Intake/Output Summary (Last 24 hours) at 10/28/2022 0816 Last data filed at 10/28/2022 0334 Gross per 24 hour  Intake 207.78 ml  Output --  Net 207.78 ml      10/28/2022    5:52 AM 10/27/2022    4:20 PM 10/24/2022   10:56 AM  Last 3 Weights  Weight (lbs) 245 lb 2.4 oz 251 lb 1.7 oz 251 lb  Weight (kg) 111.2 kg 113.9 kg 113.853 kg      Telemetry    Sinus bradycardia down to 40s over the past 24 hours  - Personally Reviewed  ECG    Sinus bradycardia 49bpm, first degree AVB - Personally Reviewed  Physical Exam   GEN: No acute distress.   Neck: No JVD Cardiac: RRR, no murmurs, rubs, or gallops.  Respiratory: Clear to auscultation  bilaterally. GI: Soft, nontender, non-distended  MS: No edema; No deformity. Neuro:  Nonfocal  Psych: Normal affect   Labs    High Sensitivity Troponin:   Recent Labs  Lab 10/27/22 1329 10/27/22 1533 10/27/22 2110 10/27/22 2223  TROPONINIHS 84* 71* 92* 93*     Chemistry Recent Labs  Lab 10/27/22 1329 10/27/22 2110 10/28/22 0307  NA 137  --  136  K 4.5  --  3.8  CL 103  --  105  CO2 24  --  24  GLUCOSE 102*  --  113*  BUN 23  --  24*  CREATININE 1.16  --  1.28*  CALCIUM 9.3  --  8.5*  MG  --  2.2  --   PROT  --  7.3  --   ALBUMIN  --  4.2  --   AST  --  52*  --   ALT  --  55*  --   ALKPHOS  --  75  --   BILITOT  --  1.1  --   GFRNONAA >60  --  57*  ANIONGAP 10  --  7    Lipids  Recent Labs  Lab 10/28/22 0307  CHOL 111  TRIG 85  HDL 43  LDLCALC 51  CHOLHDL 2.6    Hematology Recent Labs  Lab 10/27/22 1329 10/28/22 0307  WBC 7.6 6.6  RBC 4.31 3.90*  HGB 14.1 12.6*  HCT 40.2 36.4*  MCV 93.3 93.3  MCH 32.7 32.3  MCHC 35.1 34.6  RDW 13.4 13.2  PLT 211 197   Thyroid  Recent Labs  Lab 10/27/22 2110  TSH 3.290  FREET4 0.71    BNP Recent Labs  Lab 10/27/22 1329  BNP 36.2    DDimer No results for input(s): "DDIMER" in the last 168 hours.   Radiology    DG Chest 2 View  Result Date: 10/27/2022 CLINICAL DATA:  Chest pain.  Intermittent chest pressure. EXAM: CHEST - 2 VIEW COMPARISON:  Chest radiographs 07/31/2007 FINDINGS: Cardiac silhouette is at the upper limits of normal size. There is again a tortuous ascending and descending thoracic aorta. Focal calcific density overlying the inferior right lung, likely benign calcified granuloma. No focal airspace opacity. No pleural effusion or pneumothorax. Moderate multilevel degenerative disc changes of the thoracic spine. IMPRESSION: No active cardiopulmonary disease. Electronically Signed   By: Neita Garnet M.D.   On: 10/27/2022 14:03    Cardiac Studies   N/A   Patient Profile     [name] is a 82 y.o. male with prostate cancer s/p cryoablation 2013, HTN, HLD and remote brain tumor s/p craniotomy, currently admitted under cardiology service for chest pain.   Assessment & Plan    Chest pain - presented with chest pressure with exertion and at rest for 2 weeks  - Hs trop 84 >71 >92 >93  - EKG without acute ischemic finding - CT coronary study and Echo is pending today  - Medical therapy: continue heparin gtt,  ASA, rosuvastatin 40mg , and PRN nitro, will stop metoprolol 12.5mg  BID given bradycardia 40s today, more recommendation pending work up   HTN - BP mildly elevated, PTA meds losartan 100mg  continued, trend BP   HLD - PTA rosuvastatin increased to 40mg  daily, tri 205, LDL 51, LPA pending   Presumed CKD III - Cr 1.28 and GFR 57 today, Cr 1.16 at admission, was 1.24 in Oct 2023, no recent labs, suspect CKD III, will monitor renal index, will gentle IVF hydration before contrast use to avoid CIN today   DVT prophylaxis - on therapeutic heparin gtt currently    AD: Full code  Dispo: likely home 24-48 hours pending chest pain workup    For questions or updates, please contact [facility] Please consult [email or web address] for contact info under        Signed, Cyndi Bender, NP  10/28/2022, 8:16 AM    History and all data above reviewed.  Patient examined.  I agree with the findings as above.  No pain.  No SOB.  He is most concerned about a rash that he gets around his neck when he is in bed sometimes at night. The patient exam reveals COR:RRR, no murmurms  ,  Lungs: Clear  ,  Abd: Positive bowel sounds, no rebound no guarding, Ext No edema  .  All available labs, radiology testing, previous records reviewed. Agree with documented assessment and plan.   Chest pain:  Non anginal greater than anginal features.  CT completed and results pending.  Echo pending.  If these are OK no further  testing.  Rash:  Possibly related to BP and I asked him to get a cuff and  check this at home when he gets the rash and is lying down.  This might just be positional plethora and not be a true rash.    [name]  1:32 PM  10/28/2022

## 2022-10-28 NOTE — Progress Notes (Signed)
  Echocardiogram 2D Echocardiogram has been performed.  Maren Reamer 10/28/2022, 3:54 PM

## 2022-10-28 NOTE — Progress Notes (Signed)
  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 RCA was not modeled due to artifact.  Result reviewed with the patient at bedside. He is consent for cath tomorrow. Will repeat BMP and INR tomorrow. Getting IVF for renal hydration.    Shared Decision Making/Informed Consent  The risks [stroke (1 in 1000), death (1 in 1000), kidney failure [usually temporary] (1 in 500), bleeding (1 in 200), allergic reaction [possibly serious] (1 in 200)], benefits (diagnostic support and management of coronary artery disease) and alternatives of a cardiac catheterization were discussed in detail with [name] and he is willing to proceed.

## 2022-10-28 NOTE — Progress Notes (Signed)
ANTICOAGULATION CONSULT NOTE   Pharmacy Consult for Heparin Indication: chest pain/ACS  Allergies  Allergen Reactions   Norvasc [Amlodipine] Swelling    Leg swelling   Red Dye Hives    Patient Measurements: Height: 5\' 9"  (175.3 cm) Weight: 111.2 kg (245 lb 2.4 oz) IBW/kg (Calculated) : 70.7 Heparin Dosing Weight: 96 kg  Vital Signs: Temp: 98.2 F (36.8 C) (04/30 0822) Temp Source: Oral (04/30 0822) BP: 126/70 (04/30 0822) Pulse Rate: 64 (04/30 0822)  Labs: Recent Labs    10/27/22 1329 10/27/22 1533 10/27/22 2110 10/27/22 2223 10/28/22 0307 10/28/22 1410  HGB 14.1  --   --   --  12.6*  --   HCT 40.2  --   --   --  36.4*  --   PLT 211  --   --   --  197  --   HEPARINUNFRC  --   --   --   --  0.26* 0.60  CREATININE 1.16  --   --   --  1.28*  --   TROPONINIHS 84* 71* 92* 93*  --   --      Estimated Creatinine Clearance: 56.6 mL/min (A) (by C-G formula based on SCr of 1.28 mg/dL (H)).   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   Past Week   losartan (COZAAR) 100 MG tablet Take 1 tablet by mouth once daily (Patient taking differently: Take 100 mg by mouth at bedtime.) 90 tablet 0 Past Week   nitroGLYCERIN (NITROSTAT) 0.4 MG SL tablet Place 1 tablet (0.4 mg total) under the tongue every 5 (five) minutes as needed for chest pain. 50 tablet 3 NEVER   rosuvastatin (CRESTOR) 10 MG tablet Take 1 tablet (10 mg total) by mouth daily. (Patient taking differently: Take 10 mg by mouth at bedtime.) 90 tablet 3 Past Week   triamcinolone cream (KENALOG) 0.1 % Apply 1 Application topically 2 (two) times daily. (Patient taking differently: Apply 1 Application  topically 2 (two) times daily as needed (rash).) 30 g 0 Past Week    Assessment: 81 yom with a history of HTN, HLD . Patient is presenting with chest pain. Heparin per pharmacy consult placed for chest pain/ACS. Patient is not on anticoagulation prior to arrival. -heparin level= 0.6 on 1400 units/hr -plans noted for cath on 5/1  Goal of Therapy:  Heparin level 0.3-0.7 units/ml Monitor platelets by anticoagulation protocol: Yes   Plan:  -Continue heparin at 1400 units/hr -Daily heparin level and CBC  Harland German, PharmD Clinical Pharmacist **Pharmacist phone directory can now be found on [email or web address] (PW [email or web address]).  Listed under [facility].

## 2022-10-28 NOTE — Care Management (Signed)
  Transition of Care [facility]) Screening Note   Patient Details  Name: [name] Date of Birth: Oct 29, 1940   [facility] of [facility]) CM/SW Contact:    Gala Lewandowsky, RN Phone Number: 10/28/2022, 1:31 PM    Transition of Care Department [facility]) has reviewed the patient and no TOC needs have been identified at this time. Patient presented for chest pain. PTA patient reports that he was independent from home with his son. No DME needs identified. We will continue to monitor patient advancement through interdisciplinary progression rounds. If new patient transition needs arise, please place a TOC consult.

## 2022-10-28 NOTE — Plan of Care (Signed)

## 2022-10-28 NOTE — Progress Notes (Signed)
ANTICOAGULATION CONSULT NOTE   Pharmacy Consult for Heparin Indication: chest pain/ACS  Allergies  Allergen Reactions   Amlodipine     Leg swelling   Red Dye Hives    Patient Measurements: Height: 5\' 9"  (175.3 cm) Weight: 113.9 kg (251 lb 1.7 oz) IBW/kg (Calculated) : 70.7 Heparin Dosing Weight: 96 kg  Vital Signs: Temp: 98.1 F (36.7 C) (04/29 2344) Temp Source: Oral (04/29 2344) BP: 121/62 (04/29 2344) Pulse Rate: 51 (04/29 2344)  Labs: Recent Labs    10/27/22 1329 10/27/22 1533 10/27/22 2110 10/27/22 2223 10/28/22 0307  HGB 14.1  --   --   --  12.6*  HCT 40.2  --   --   --  36.4*  PLT 211  --   --   --  197  HEPARINUNFRC  --   --   --   --  0.26*  CREATININE 1.16  --   --   --  1.28*  TROPONINIHS 84* 71* 92* 93*  --      Estimated Creatinine Clearance: 57.3 mL/min (A) (by C-G formula based on SCr of 1.28 mg/dL (H)).   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 50 tablet 3    rosuvastatin (CRESTOR) 10 MG tablet Take 1 tablet (10 mg total) by mouth daily. 90 tablet 3    triamcinolone cream (KENALOG) 0.1 % Apply 1 Application topically 2 (two) times daily. 30 g 0     Scheduled:  Infusions:  PRN:   Assessment: 81 yom with a history of HTN, HLD . Patient is presenting with chest pain. Heparin per pharmacy consult placed for chest pain/ACS. Patient is not on anticoagulation prior to arrival.  Initial heparin level 0.26 on 1200 units/hr. No issues with infusion or over s/sx of  bleeding per RN. Hgb down slightly to 12.6  Goal of Therapy:  Heparin level 0.3-0.7 units/ml Monitor platelets by anticoagulation protocol: Yes   Plan:  Increase heparin infusion to 1400 units/hr Check anti-Xa level in 8 hours and daily while on heparin Continue to monitor H&H and platelets  Ruben Im, PharmD Clinical Pharmacist 10/28/2022 5:27 AM Please check AMION for all [facility] numbers

## 2022-10-29 ENCOUNTER — Encounter (HOSPITAL_COMMUNITY)
Admission: EM | Disposition: A | Discharge status: Home / Self Care | Payer: Self-pay | Source: Home / Self Care | Attending: Thoracic Surgery (Cardiothoracic Vascular Surgery)

## 2022-10-29 DIAGNOSIS — R079 Chest pain, unspecified: Secondary | ICD-10-CM | POA: Diagnosis not present

## 2022-10-29 DIAGNOSIS — I25110 Atherosclerotic heart disease of native coronary artery with unstable angina pectoris: Secondary | ICD-10-CM

## 2022-10-29 HISTORY — PX: LEFT HEART CATH AND CORONARY ANGIOGRAPHY: CATH118249

## 2022-10-29 LAB — CBC
HCT: 35.8 % — ABNORMAL LOW (ref 39.0–52.0)
Hemoglobin: 12.4 g/dL — ABNORMAL LOW (ref 13.0–17.0)
MCH: 32.6 pg (ref 26.0–34.0)
MCHC: 34.6 g/dL (ref 30.0–36.0)
MCV: 94.2 fL (ref 80.0–100.0)
Platelets: 185 10*3/uL (ref 150–400)
RBC: 3.8 MIL/uL — ABNORMAL LOW (ref 4.22–5.81)
RDW: 13.5 % (ref 11.5–15.5)
WBC: 8.1 10*3/uL (ref 4.0–10.5)
nRBC: 0 % (ref 0.0–0.2)

## 2022-10-29 LAB — PROTIME-INR
INR: 1.1 (ref 0.8–1.2)
Prothrombin Time: 14.2 seconds (ref 11.4–15.2)

## 2022-10-29 LAB — HEPARIN LEVEL (UNFRACTIONATED): Heparin Unfractionated: 0.56 IU/mL (ref 0.30–0.70)

## 2022-10-29 LAB — BASIC METABOLIC PANEL
Anion gap: 7 (ref 5–15)
BUN: 21 mg/dL (ref 8–23)
CO2: 23 mmol/L (ref 22–32)
Calcium: 8.4 mg/dL — ABNORMAL LOW (ref 8.9–10.3)
Chloride: 105 mmol/L (ref 98–111)
Creatinine, Ser: 1.25 mg/dL — ABNORMAL HIGH (ref 0.61–1.24)
GFR, Estimated: 58 mL/min — ABNORMAL LOW (ref >60)
Glucose, Bld: 112 mg/dL — ABNORMAL HIGH (ref 70–99)
Potassium: 4 mmol/L (ref 3.5–5.1)
Sodium: 135 mmol/L (ref 135–145)

## 2022-10-29 LAB — LIPOPROTEIN A (LPA): Lipoprotein (a): 22.4 nmol/L (ref <75.0)

## 2022-10-29 SURGERY — LEFT HEART CATH AND CORONARY ANGIOGRAPHY
Anesthesia: LOCAL

## 2022-10-29 MED ORDER — IOHEXOL 350 MG/ML SOLN
INTRAVENOUS | Status: DC | PRN
  Administered 2022-10-29: 115 mL via INTRA_ARTERIAL

## 2022-10-29 MED ORDER — SODIUM CHLORIDE 0.9 % WEIGHT BASED INFUSION: 3.0000 mL/kg/h | INTRAVENOUS | Status: DC

## 2022-10-29 MED ORDER — LABETALOL HCL 5 MG/ML IV SOLN: 10.0000 mg | INTRAVENOUS | Status: AC | PRN

## 2022-10-29 MED ORDER — HEPARIN SODIUM (PORCINE) 1000 UNIT/ML IJ SOLN
INTRAMUSCULAR | Status: AC
  Filled 2022-10-29: qty 10

## 2022-10-29 MED ORDER — SODIUM CHLORIDE 0.9% FLUSH
3.0000 mL | Freq: Two times a day (BID) | INTRAVENOUS | Status: DC
  Administered 2022-10-29 – 2022-10-30 (×3): 3 mL via INTRAVENOUS

## 2022-10-29 MED ORDER — VERAPAMIL HCL 2.5 MG/ML IV SOLN
INTRAVENOUS | Status: AC
  Filled 2022-10-29: qty 2

## 2022-10-29 MED ORDER — MIDAZOLAM HCL 2 MG/2ML IJ SOLN
INTRAMUSCULAR | Status: DC | PRN
  Administered 2022-10-29: 1 mg via INTRAVENOUS

## 2022-10-29 MED ORDER — SODIUM CHLORIDE 0.9% FLUSH: 3.0000 mL | INTRAVENOUS | Status: DC | PRN

## 2022-10-29 MED ORDER — LIDOCAINE HCL (PF) 1 % IJ SOLN
INTRAMUSCULAR | Status: AC
  Filled 2022-10-29: qty 30

## 2022-10-29 MED ORDER — FENTANYL CITRATE (PF) 100 MCG/2ML IJ SOLN
INTRAMUSCULAR | Status: AC
  Filled 2022-10-29: qty 2

## 2022-10-29 MED ORDER — SODIUM CHLORIDE 0.9 % IV SOLN: 250.0000 mL | INTRAVENOUS | Status: DC | PRN

## 2022-10-29 MED ORDER — FENTANYL CITRATE (PF) 100 MCG/2ML IJ SOLN
INTRAMUSCULAR | Status: DC | PRN
  Administered 2022-10-29: 25 ug via INTRAVENOUS

## 2022-10-29 MED ORDER — SODIUM CHLORIDE 0.9 % IV SOLN
INTRAVENOUS | Status: AC
  Administered 2022-10-29: 1000 mL via INTRAVENOUS

## 2022-10-29 MED ORDER — ASPIRIN 81 MG PO CHEW
81.0000 mg | CHEWABLE_TABLET | ORAL | Status: AC
  Administered 2022-10-29: 81 mg via ORAL
  Filled 2022-10-29: qty 1

## 2022-10-29 MED ORDER — HYDRALAZINE HCL 20 MG/ML IJ SOLN: 10.0000 mg | INTRAMUSCULAR | Status: AC | PRN

## 2022-10-29 MED ORDER — HEPARIN (PORCINE) 25000 UT/250ML-% IV SOLN
1600.0000 [IU]/h | INTRAVENOUS | Status: DC
  Administered 2022-10-29: 1350 [IU]/h via INTRAVENOUS
  Administered 2022-10-30: 1400 [IU]/h via INTRAVENOUS
  Filled 2022-10-29 (×2): qty 250

## 2022-10-29 MED ORDER — HEPARIN (PORCINE) IN NACL 1000-0.9 UT/500ML-% IV SOLN
INTRAVENOUS | Status: DC | PRN
  Administered 2022-10-29 (×2): 500 mL via INTRA_ARTERIAL

## 2022-10-29 MED ORDER — SODIUM CHLORIDE 0.9% FLUSH
3.0000 mL | Freq: Two times a day (BID) | INTRAVENOUS | Status: DC
  Administered 2022-10-29: 3 mL via INTRAVENOUS

## 2022-10-29 MED ORDER — SODIUM CHLORIDE 0.9 % WEIGHT BASED INFUSION: 1.0000 mL/kg/h | INTRAVENOUS | Status: DC

## 2022-10-29 MED ORDER — MIDAZOLAM HCL 2 MG/2ML IJ SOLN
INTRAMUSCULAR | Status: AC
  Filled 2022-10-29: qty 2

## 2022-10-29 MED ORDER — LIDOCAINE HCL (PF) 1 % IJ SOLN
INTRAMUSCULAR | Status: DC | PRN
  Administered 2022-10-29: 5 mL

## 2022-10-29 MED ORDER — VERAPAMIL HCL 2.5 MG/ML IV SOLN
INTRAVENOUS | Status: DC | PRN
  Administered 2022-10-29: 10 mL via INTRA_ARTERIAL

## 2022-10-29 MED ORDER — HEPARIN SODIUM (PORCINE) 1000 UNIT/ML IJ SOLN
INTRAMUSCULAR | Status: DC | PRN
  Administered 2022-10-29: 5500 [IU] via INTRAVENOUS

## 2022-10-29 SURGICAL SUPPLY — 11 items
CATH INFINITI 5FR JL4 (CATHETERS) IMPLANT
CATH OPTITORQUE TIG 4.0 5F (CATHETERS) IMPLANT
DEVICE RAD COMP TR BAND LRG (VASCULAR PRODUCTS) IMPLANT
GLIDESHEATH SLEND SS 6F .021 (SHEATH) IMPLANT
GUIDEWIRE INQWIRE 1.5J.035X260 (WIRE) IMPLANT
INQWIRE 1.5J .035X260CM (WIRE) ×1
KIT HEART LEFT (KITS) ×1 IMPLANT
PACK CARDIAC CATHETERIZATION (CUSTOM PROCEDURE TRAY) ×1 IMPLANT
SHEATH PROBE COVER 6X72 (BAG) IMPLANT
TRANSDUCER W/STOPCOCK (MISCELLANEOUS) ×1 IMPLANT
TUBING CIL FLEX 10 FLL-RA (TUBING) ×1 IMPLANT

## 2022-10-29 NOTE — Interval H&P Note (Signed)
History and Physical Interval Note:  10/29/2022 3:16 PM  [name]  has presented today for surgery, with the diagnosis of chest pain with abnormal coronary CT angiogram.  The various methods of treatment have been discussed with the patient and family. After consideration of risks, benefits and other options for treatment, the patient has consented to  Procedure(s): LEFT HEART CATH AND CORONARY ANGIOGRAPHY (N/A)  PERCUTANEOUS CORONARY INTERVENTION  as a surgical intervention.  The patient's history has been reviewed, patient examined, no change in status, stable for surgery.  I have reviewed the patient's chart and labs.  Questions were answered to the patient's satisfaction.    Cath Lab Visit (complete for each Cath Lab visit)  Clinical Evaluation Leading to the Procedure:   ACS: Yes.  Progressive angina  Non-ACS:    Anginal Classification: CCS III  Anti-ischemic medical therapy: Minimal Therapy (1 class of medications)  Non-Invasive Test Results: High-risk stress test findings: cardiac mortality >3%/year  Prior CABG: No previous CABG   Bryan Lemma

## 2022-10-29 NOTE — Progress Notes (Signed)
ANTICOAGULATION CONSULT NOTE   Pharmacy Consult for Heparin Indication: chest pain/ACS  Allergies  Allergen Reactions   Norvasc [Amlodipine] Swelling    Leg swelling   Red Dye Hives    Patient Measurements: Height: 5\' 9"  (175.3 cm) Weight: 111.2 kg (245 lb 2.4 oz) IBW/kg (Calculated) : 70.7 Heparin Dosing Weight: 96 kg  Vital Signs: Temp: 98 F (36.7 C) (05/01 0834) Temp Source: Oral (05/01 0834) BP: 135/56 (05/01 0834) Pulse Rate: 59 (05/01 0834)  Labs: Recent Labs    10/27/22 1329 10/27/22 1533 10/27/22 2110 10/27/22 2223 10/28/22 0307 10/28/22 1410 10/29/22 0417  HGB 14.1  --   --   --  12.6*  --  12.4*  HCT 40.2  --   --   --  36.4*  --  35.8*  PLT 211  --   --   --  197  --  185  LABPROT  --   --   --   --   --   --  14.2  INR  --   --   --   --   --   --  1.1  HEPARINUNFRC  --   --   --   --  0.26* 0.60 0.56  CREATININE 1.16  --   --   --  1.28*  --  1.25*  TROPONINIHS 84* 71* 92* 93*  --   --   --      Estimated Creatinine Clearance: 57.9 mL/min (A) (by C-G formula based on SCr of 1.25 mg/dL (H)).   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   Past Week   losartan (COZAAR) 100 MG tablet Take 1 tablet by mouth once daily (Patient taking differently: Take 100 mg by mouth at bedtime.) 90 tablet 0 Past Week   nitroGLYCERIN (NITROSTAT) 0.4 MG SL tablet Place 1 tablet (0.4 mg total) under the tongue every 5 (five) minutes as needed for chest pain. 50 tablet 3 NEVER   rosuvastatin (CRESTOR) 10 MG tablet Take 1 tablet (10 mg total) by mouth daily. (Patient taking differently: Take 10 mg by mouth at bedtime.) 90 tablet 3 Past Week   triamcinolone  cream (KENALOG) 0.1 % Apply 1 Application topically 2 (two) times daily. (Patient taking differently: Apply 1 Application topically 2 (two) times daily as needed (rash).) 30 g 0 Past Week    Assessment: 81 yom with a history of HTN, HLD . Patient is presenting with chest pain. Heparin per pharmacy consult placed for chest pain/ACS. Patient is not on anticoagulation prior to arrival. -heparin level= 0.56 on 1400 units/hr -plans noted for cath today  Goal of Therapy:  Heparin level 0.3-0.7 units/ml Monitor platelets by anticoagulation protocol: Yes   Plan:  -Continue heparin at 1400 units/hr -Daily heparin level and CBC  Harland German, PharmD Clinical Pharmacist **Pharmacist phone directory can now be found on [email or web address] ([email or web address]).  Listed under [facility].

## 2022-10-29 NOTE — Progress Notes (Addendum)
ANTICOAGULATION CONSULT NOTE  Pharmacy Consult for Heparin Indication: chest pain/ACS  Allergies  Allergen Reactions   Norvasc [Amlodipine] Swelling    Leg swelling   Red Dye Hives    Patient Measurements: Height: 5\' 9"  (175.3 cm) Weight: 111.2 kg (245 lb 2.4 oz) IBW/kg (Calculated) : 70.7 Heparin Dosing Weight: 96 kg  Vital Signs: Temp: 98.1 F (36.7 C) (05/01 1623) Temp Source: Oral (05/01 1623) BP: 122/60 (05/01 1623) Pulse Rate: 52 (05/01 1623)  Labs: Recent Labs    10/27/22 1329 10/27/22 1533 10/27/22 2110 10/27/22 2223 10/28/22 0307 10/28/22 1410 10/29/22 0417  HGB 14.1  --   --   --  12.6*  --  12.4*  HCT 40.2  --   --   --  36.4*  --  35.8*  PLT 211  --   --   --  197  --  185  LABPROT  --   --   --   --   --   --  14.2  INR  --   --   --   --   --   --  1.1  HEPARINUNFRC  --   --   --   --  0.26* 0.60 0.56  CREATININE 1.16  --   --   --  1.28*  --  1.25*  TROPONINIHS 84* 71* 92* 93*  --   --   --      Estimated Creatinine Clearance: 57.9 mL/min (A) (by C-G formula based on SCr of 1.25 mg/dL (H)).    Assessment: 77 YOM with a history of HTN and HLD presented with chest pain.  Patient was started on IV heparin and then was turned off for cath.  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  Pharmacy consulted to resume IV heparin 2 hours post TR band removal.  Discussed with RN, TR band to be removed at 1900.  Goal of Therapy:  Heparin level 0.3-0.7 units/ml Monitor platelets by anticoagulation protocol: Yes   Plan:  At 2100, resume IV heparin at 1350 units/hr Check 8 hr heparin level Daily heparin level and CBC RN aware to notify Pharmacy if TR band removal is delayed or if there is bleeding/hematoma  Marrissa Dai D. Laney Potash, PharmD, BCPS, BCCCP 10/29/2022, 6:53 PM

## 2022-10-29 NOTE — Progress Notes (Addendum)
 Rounding Note    Patient Name: [name] Date of Encounter: 10/29/2022  [facility] Cardiologist: Elizabella Nolet, MD - new  Subjective   No chest pain, discussed heart cath today, no complaints  Inpatient Medications    Scheduled Meds:  aspirin EC  81 mg Oral Daily   losartan  100 mg Oral Daily   nitroGLYCERIN  1 inch Topical Q6H   rosuvastatin  40 mg Oral Daily   sodium chloride flush  3 mL Intravenous Q12H   Continuous Infusions:  sodium chloride 10 mL/hr at 10/28/22 1854   sodium chloride     sodium chloride 1 mL/kg/hr (10/29/22 0613)   heparin 1,400 Units/hr (10/29/22 0249)   PRN Meds: sodium chloride, acetaminophen, nitroGLYCERIN, ondansetron (ZOFRAN) IV, sodium chloride flush   Vital Signs    Vitals:   10/28/22 1611 10/28/22 1932 10/29/22 0318 10/29/22 0319  BP: 139/69 (!) 143/62  123/65  Pulse: (!) 58 60  (!) 57  Resp: 17 18  19  Temp: 97.8 F (36.6 C) 97.9 F (36.6 C) 97.8 F (36.6 C) 97.8 F (36.6 C)  TempSrc: Oral Oral Oral Oral  SpO2: 98% 98%    Weight:    111.2 kg  Height:        Intake/Output Summary (Last 24 hours) at 10/29/2022 0812 Last data filed at 10/29/2022 0646 Gross per 24 hour  Intake 2224.72 ml  Output --  Net 2224.72 ml      10/29/2022    3:19 AM 10/28/2022    5:52 AM 10/27/2022    4:20 PM  Last 3 Weights  Weight (lbs) 245 lb 2.4 oz 245 lb 2.4 oz 251 lb 1.7 oz  Weight (kg) 111.2 kg 111.2 kg 113.9 kg      Telemetry    Sinus rhythm to sinus bradycardia with HR 30-60s - Personally Reviewed  ECG    No new tracings - Personally Reviewed  Physical Exam   GEN: No acute distress.   Neck: No JVD Cardiac: RRR, no murmurs, rubs, or gallops.  Respiratory: Clear to auscultation bilaterally. GI: Soft, nontender, non-distended  MS: No edema; No deformity. Neuro:  Nonfocal  Psych: Normal affect   Labs    High Sensitivity Troponin:   Recent Labs  Lab 10/27/22 1329 10/27/22 1533 10/27/22 2110 10/27/22 2223   TROPONINIHS 84* 71* 92* 93*     Chemistry Recent Labs  Lab 10/27/22 1329 10/27/22 2110 10/28/22 0307 10/29/22 0417  NA 137  --  136 135  K 4.5  --  3.8 4.0  CL 103  --  105 105  CO2 24  --  24 23  GLUCOSE 102*  --  113* 112*  BUN 23  --  24* 21  CREATININE 1.16  --  1.28* 1.25*  CALCIUM 9.3  --  8.5* 8.4*  MG  --  2.2  --   --   PROT  --  7.3  --   --   ALBUMIN  --  4.2  --   --   AST  --  52*  --   --   ALT  --  55*  --   --   ALKPHOS  --  75  --   --   BILITOT  --  1.1  --   --   GFRNONAA >60  --  57* 58*  ANIONGAP 10  --  7 7    Lipids  Recent Labs  Lab 10/28/22 0307  CHOL 111    TRIG 85  HDL 43  LDLCALC 51  CHOLHDL 2.6    Hematology Recent Labs  Lab 10/27/22 1329 10/28/22 0307 10/29/22 0417  WBC 7.6 6.6 8.1  RBC 4.31 3.90* 3.80*  HGB 14.1 12.6* 12.4*  HCT 40.2 36.4* 35.8*  MCV 93.3 93.3 94.2  MCH 32.7 32.3 32.6  MCHC 35.1 34.6 34.6  RDW 13.4 13.2 13.5  PLT 211 197 185   Thyroid  Recent Labs  Lab 10/27/22 2110  TSH 3.290  FREET4 0.71    BNP Recent Labs  Lab 10/27/22 1329  BNP 36.2    DDimer No results for input(s): "DDIMER" in the last 168 hours.   Radiology    ECHOCARDIOGRAM COMPLETE  Result Date: 10/28/2022    ECHOCARDIOGRAM REPORT   Patient Name:   [name] Date of Exam: 10/28/2022 Medical Rec #:  [id number]        Height:       69.0 in Accession #:    [id number]       Weight:       245.1 lb Date of Birth:  03/30/1941        BSA:          2.253 m Patient Age:    82 years         BP:           126/70 mmHg Patient Gender: M                HR:           49 bpm. Exam Location:  Inpatient Procedure: 2D Echo, Cardiac Doppler and Color Doppler Indications:    Chest Pain R07.9  History:        Patient has no prior history of Echocardiogram examinations.                 Angina, Signs/Symptoms:Chest Pain; Risk Factors:Former Smoker                 and Dyslipidemia.  Sonographer:    Norma Walker Referring Phys: [id number] LAURA R INGOLD  Sonographer  Comments: Suboptimal subcostal window. Image acquisition challenging due to patient body habitus and Image acquisition challenging due to respiratory motion. IMPRESSIONS  1. Left ventricular ejection fraction, by estimation, is 60 to 65%. The left ventricle has normal function. The left ventricle has no regional wall motion abnormalities. There is mild concentric left ventricular hypertrophy. Left ventricular diastolic parameters are consistent with Grade II diastolic dysfunction (pseudonormalization).  2. Right ventricular systolic function is normal. The right ventricular size is normal. Tricuspid regurgitation signal is inadequate for assessing PA pressure.  3. Left atrial size was mildly dilated.  4. The mitral valve is grossly normal. No evidence of mitral valve regurgitation.  5. The aortic valve was not well visualized. There is mild calcification of the aortic valve. Aortic valve regurgitation is not visualized. No aortic stenosis is present. Comparison(s): No prior Echocardiogram. FINDINGS  Left Ventricle: Left ventricular ejection fraction, by estimation, is 60 to 65%. The left ventricle has normal function. The left ventricle has no regional wall motion abnormalities. The left ventricular internal cavity size was normal in size. There is  mild concentric left ventricular hypertrophy. Left ventricular diastolic parameters are consistent with Grade II diastolic dysfunction (pseudonormalization). Right Ventricle: The right ventricular size is normal. No increase in right ventricular wall thickness. Right ventricular systolic function is normal. Tricuspid regurgitation signal is inadequate for assessing PA pressure. Left Atrium: Left atrial size was mildly   dilated. Right Atrium: Right atrial size was normal in size. Pericardium: There is no evidence of pericardial effusion. Mitral Valve: The mitral valve is grossly normal. No evidence of mitral valve regurgitation. Tricuspid Valve: The tricuspid valve is  normal in structure. Tricuspid valve regurgitation is not demonstrated. No evidence of tricuspid stenosis. Aortic Valve: The aortic valve was not well visualized. There is mild calcification of the aortic valve. Aortic valve regurgitation is not visualized. No aortic stenosis is present. Pulmonic Valve: The pulmonic valve was normal in structure. Pulmonic valve regurgitation is not visualized. No evidence of pulmonic stenosis. Aorta: The aortic root and ascending aorta are structurally normal, with no evidence of dilitation. IAS/Shunts: No atrial level shunt detected by color flow Doppler.  LEFT VENTRICLE PLAX 2D LVIDd:         5.30 cm   Diastology LVIDs:         3.60 cm   LV e' medial:    5.27 cm/s LV PW:         1.10 cm   LV E/e' medial:  16.8 LV IVS:        1.10 cm   LV e' lateral:   4.78 cm/s LVOT diam:     2.10 cm   LV E/e' lateral: 18.5 LV SV:         99 LV SV Index:   44 LVOT Area:     3.46 cm  RIGHT VENTRICLE RV S prime:     9.65 cm/s TAPSE (M-mode): 1.7 cm LEFT ATRIUM             Index        RIGHT ATRIUM           Index LA diam:        4.90 cm 2.18 cm/m   RA Area:     15.10 cm LA Vol (A2C):   86.3 ml 38.31 ml/m  RA Volume:   34.30 ml  15.23 ml/m LA Vol (A4C):   63.3 ml 28.10 ml/m LA Biplane Vol: 78.4 ml 34.80 ml/m  AORTIC VALVE             PULMONIC VALVE LVOT Vmax:   120.00 cm/s PR End Diast Vel: 1.52 msec LVOT Vmean:  80.300 cm/s LVOT VTI:    0.287 m  AORTA Ao Root diam: 3.70 cm Ao Asc diam:  3.70 cm MITRAL VALVE MV Area (PHT): 3.89 cm    SHUNTS MV Decel Time: 195 msec    Systemic VTI:  0.29 m MV E velocity: 88.30 cm/s  Systemic Diam: 2.10 cm MV A velocity: 70.30 cm/s MV E/A ratio:  1.26 Mahesh Chandrasekhar MD Electronically signed by Mahesh Chandrasekhar MD Signature Date/Time: 10/28/2022/4:53:10 PM    Final    CT CORONARY MORPH W/CTA COR W/SCORE W/CA W/CM &/OR WO/CM  Addendum Date: 10/28/2022   ADDENDUM REPORT: 10/28/2022 13:57 EXAM: OVER-READ INTERPRETATION  CT CHEST The following report is  an over-read performed by radiologist Dr. William Derryof [facility] on 10/28/2022. This over-read does not include interpretation of cardiac or coronary anatomy or pathology. The coronary calcium score and coronary CTA interpretation by the cardiologist is attached. COMPARISON:  None. FINDINGS: Vascular: There are no significant non-cardiac vascular findings. Mild noncalcified atherosclerosis of the descending thoracic aorta. Mediastinum/Nodes: There are no enlarged lymph nodes.The visualized esophagus demonstrates no significant findings. Small to moderate hiatal hernia. Lungs/Pleura: Clear lungs. No pneumothorax or pleural effusion. Upper abdomen: No acute abnormality. Musculoskeletal/Chest wall: No chest wall abnormality. No acute   or significant osseous findings. IMPRESSION: 1. Small to moderate hiatal hernia. 2.  Aortic Atherosclerosis (ICD10-I70.0). Electronically Signed   By: William T Derry M.D.   On: 10/28/2022 13:57   Result Date: 10/28/2022 CLINICAL DATA:  81M with chest pain EXAM: Cardiac/Coronary CTA TECHNIQUE: The patient was scanned on a Phillips Force scanner. FINDINGS: A 100 kV prospective scan was triggered in the descending thoracic aorta at 111 HU's. Axial non-contrast 3 mm slices were carried out through the heart. The data set was analyzed on a dedicated work station and scored using the Agatson method. Gantry rotation speed was 250 msecs and collimation was .6 mm. No beta blockade and 0.8 mg of sl NTG was given. The 3D data set was reconstructed in 5% intervals of the 35-75% of the R-R cycle. Phases were analyzed on a dedicated work station using MPR, MIP and VRT modes. The patient received 100 cc of contrast. Coronary Arteries:  Normal coronary origin.  Right dominance. RCA is a large dominant artery that gives rise to PDA and PLA. Mixed plaque in ostial RCA causes 50-69% stenosis. Mixed plaque in proximal RCA causes 25-49% stenosis. Noncalcified plaque in distal RCA causes  50-69% stenosis Left main is a large artery that gives rise to LAD and LCX arteries. Short left main. Calcified plaque in left main causes 0-24% stenosis LAD is a large vessel. Calcified plaque in proximal LAD causes 50-69% stenosis. Mixed plaque in mid LAD causes 70-99% stenosis. Mixed plaque in distal LAD causes 50-69% stenosis LCX is a non-dominant artery that gives rise to one large OM1 branch. Mixed plaque in proximal LCX causes 50-69% stenosis. Mixed plaque in proximal OM1 causes 70-99% stenosis Other findings: Left Ventricle: Normal size Left Atrium: Normal size Pulmonary Veins: Normal configuration Right Ventricle: Normal size Right Atrium: Mild enlargement Cardiac valves: Mild AV calcifications Thoracic aorta: Normal size Pulmonary Arteries: Normal size Systemic Veins: Normal drainage Pericardium: Normal thickness IMPRESSION: 1. Coronary calcium score of 1409. This was 77th percentile for age and sex matched control. 2.  Normal coronary origin with right dominance. 3.  Obstructive CAD 4.  Mixed plaque in mid LAD causes severe (70-99%) stenosis 5.  Mixed plaque in proximal OM1 causes severe (70-99%) stenosis 6. Moderate (50-69%) stenosis in ostial RCA, distal RCA, proximal LAD, distal LAD, and proximal LCX 7.  Will send study for CTFFR CAD-RADS 4 Severe stenosis. (70-99% or > 50% left main). Cardiac catheterization is recommended. Consider symptom-guided anti-ischemic pharmacotherapy as well as risk factor modification per guideline directed care. Electronically Signed: By: Christopher  Schumann M.D. On: 10/28/2022 13:46   CT CORONARY FFR DATA PREP & FLUID ANALYSIS  Result Date: 10/28/2022 EXAM: FFRCT ANALYSIS FINDINGS: FFRct analysis was performed on the original cardiac CT angiogram dataset. Diagrammatic representation of the FFRct analysis is provided in a separate PDF document in PACS. This dictation was created using the PDF document and an interactive 3D model of the results. 3D model is not  available in the EMR/PACS. Normal FFR range is >0.80. 1. Left Main: No significant stenosis 2. LAD: CTFFR 0.86 across lesion in proximal LAD, suggesting lesion is not functionally significant. CTFFR falls to 0.66 across lesion in mid LAD suggesting lesion is functionally significant 3. LCX: CTFFR 0.76 across lesion in proximal LCX suggesting lesion is functionally significant. CTFFR 0.62 across lesion in proximal OM1 suggesting lesion is functionally significant. 4. RCA: Not modeled due to artifact IMPRESSION: 1. CTFFR suggests obstructive CAD in mid LAD, proximal LCX, and proximal OM1. RCA was not   modeled due to artifact Electronically Signed   By: Christopher  Schumann M.D.   On: 10/28/2022 13:54   DG Chest 2 View  Result Date: 10/27/2022 CLINICAL DATA:  Chest pain.  Intermittent chest pressure. EXAM: CHEST - 2 VIEW COMPARISON:  Chest radiographs 07/31/2007 FINDINGS: Cardiac silhouette is at the upper limits of normal size. There is again a tortuous ascending and descending thoracic aorta. Focal calcific density overlying the inferior right lung, likely benign calcified granuloma. No focal airspace opacity. No pleural effusion or pneumothorax. Moderate multilevel degenerative disc changes of the thoracic spine. IMPRESSION: No active cardiopulmonary disease. Electronically Signed   By: Ronald  Viola M.D.   On: 10/27/2022 14:03    Cardiac Studies   CCTA 10/28/22: 1. Left Main: No significant stenosis   2. LAD: CTFFR 0.86 across lesion in proximal LAD, suggesting lesion is not functionally significant. CTFFR falls to 0.66 across lesion in mid LAD suggesting lesion is functionally significant   3. LCX: CTFFR 0.76 across lesion in proximal LCX suggesting lesion is functionally significant. CTFFR 0.62 across lesion in proximal OM1 suggesting lesion is functionally significant.   4. RCA: Not modeled due to artifact   IMPRESSION: 1. CTFFR suggests obstructive CAD in mid LAD, proximal LCX,  and proximal OM1. RCA was not modeled due to artifact   Echo 10/28/22:   1. Left ventricular ejection fraction, by estimation, is 60 to 65%. The  left ventricle has normal function. The left ventricle has no regional  wall motion abnormalities. There is mild concentric left ventricular  hypertrophy. Left ventricular diastolic  parameters are consistent with Grade II diastolic dysfunction  (pseudonormalization).   2. Right ventricular systolic function is normal. The right ventricular  size is normal. Tricuspid regurgitation signal is inadequate for assessing  PA pressure.   3. Left atrial size was mildly dilated.   4. The mitral valve is grossly normal. No evidence of mitral valve  regurgitation.   5. The aortic valve was not well visualized. There is mild calcification  of the aortic valve. Aortic valve regurgitation is not visualized. No  aortic stenosis is present.   Patient Profile     81 y.o. male with prostate cancer s/p cryoablation 2013, HTN, HLD and remote brain tumor s/p craniotomy, currently admitted under cardiology service for chest pain.   Assessment & Plan    Chest pain CAD by CTA coronary - he presented with chest pressure with exertion and at rest for 2 weeks - given relatively flat troponin trend and nonischemic EKG, he underwent coronary CTA that showed coronary calcium score of 1409 with 70-99% LAD, 70-99% OM1, 50-69% in the ostial RCA with positive FFR in LAD and LCX - cardiac catheterization was recommended - planned for this afternoon - continue ASA, losartan, crestor increased - no BB give HR in the 30s at rest - no chest pain, but on nitro paste   Hypertension - continue 100 mg losartan - BP controlled, but on nitro paste, may need to consider additional agents after angiography   Hyperlipidemia with LDL goal < 70 10/28/2022: Cholesterol 111; HDL 43; LDL Cholesterol 51; Triglycerides 85; VLDL 17 LP (a) pending - continue 40 mg crestor - increased  this admission   ?CKD III sCr 1.25 (1.28) Gentle IV hydration prior to contrast load - did prep him for the possibility of a staged procedure, depending on cath results    For questions or updates, please contact [facility] Please consult [email or web address] for contact info under          Signed, Angela Nicole Duke, PA  10/29/2022, 8:12 AM    History and all data above reviewed.  Patient examined.  I agree with the findings as above. No further chest pain.  No SOB.   The patient exam reveals COR:RRR  ,  Lungs: Clear  ,  Abd: Positive bowel sounds, no rebound no guarding, Ext No edema  .  All available labs, radiology testing, previous records reviewed. Agree with documented assessment and plan. CAD:  Cath as above.  HTN:  Continue NTG paste currently.    Jovian Lembcke  12:39 PM  10/29/2022 

## 2022-10-29 NOTE — H&P (View-Only) (Signed)
Rounding Note    Patient Name: [name] Date of Encounter: 10/29/2022  [facility] Cardiologist: Rollene Rotunda, MD - new  Subjective   No chest pain, discussed heart cath today, no complaints  Inpatient Medications    Scheduled Meds:  aspirin EC  81 mg Oral Daily   losartan  100 mg Oral Daily   nitroGLYCERIN  1 inch Topical Q6H   rosuvastatin  40 mg Oral Daily   sodium chloride flush  3 mL Intravenous Q12H   Continuous Infusions:  sodium chloride 10 mL/hr at 10/28/22 1854   sodium chloride     sodium chloride 1 mL/kg/hr (10/29/22 0613)   heparin 1,400 Units/hr (10/29/22 0249)   PRN Meds: sodium chloride, acetaminophen, nitroGLYCERIN, ondansetron (ZOFRAN) IV, sodium chloride flush   Vital Signs    Vitals:   10/28/22 1611 10/28/22 1932 10/29/22 0318 10/29/22 0319  BP: 139/69 (!) 143/62  123/65  Pulse: (!) 58 60  (!) 57  Resp: 17 18  19   Temp: 97.8 F (36.6 C) 97.9 F (36.6 C) 97.8 F (36.6 C) 97.8 F (36.6 C)  TempSrc: Oral Oral Oral Oral  SpO2: 98% 98%    Weight:    111.2 kg  Height:        Intake/Output Summary (Last 24 hours) at 10/29/2022 0812 Last data filed at 10/29/2022 0646 Gross per 24 hour  Intake 2224.72 ml  Output --  Net 2224.72 ml      10/29/2022    3:19 AM 10/28/2022    5:52 AM 10/27/2022    4:20 PM  Last 3 Weights  Weight (lbs) 245 lb 2.4 oz 245 lb 2.4 oz 251 lb 1.7 oz  Weight (kg) 111.2 kg 111.2 kg 113.9 kg      Telemetry    Sinus rhythm to sinus bradycardia with HR 30-60s - Personally Reviewed  ECG    No new tracings - Personally Reviewed  Physical Exam   GEN: No acute distress.   Neck: No JVD Cardiac: RRR, no murmurs, rubs, or gallops.  Respiratory: Clear to auscultation bilaterally. GI: Soft, nontender, non-distended  MS: No edema; No deformity. Neuro:  Nonfocal  Psych: Normal affect   Labs    High Sensitivity Troponin:   Recent Labs  Lab 10/27/22 1329 10/27/22 1533 10/27/22 2110 10/27/22 2223   TROPONINIHS 84* 71* 92* 93*     Chemistry Recent Labs  Lab 10/27/22 1329 10/27/22 2110 10/28/22 0307 10/29/22 0417  NA 137  --  136 135  K 4.5  --  3.8 4.0  CL 103  --  105 105  CO2 24  --  24 23  GLUCOSE 102*  --  113* 112*  BUN 23  --  24* 21  CREATININE 1.16  --  1.28* 1.25*  CALCIUM 9.3  --  8.5* 8.4*  MG  --  2.2  --   --   PROT  --  7.3  --   --   ALBUMIN  --  4.2  --   --   AST  --  52*  --   --   ALT  --  55*  --   --   ALKPHOS  --  75  --   --   BILITOT  --  1.1  --   --   GFRNONAA >60  --  57* 58*  ANIONGAP 10  --  7 7    Lipids  Recent Labs  Lab 10/28/22 0307  CHOL 111  TRIG 85  HDL 43  LDLCALC 51  CHOLHDL 2.6    Hematology Recent Labs  Lab 10/27/22 1329 10/28/22 0307 10/29/22 0417  WBC 7.6 6.6 8.1  RBC 4.31 3.90* 3.80*  HGB 14.1 12.6* 12.4*  HCT 40.2 36.4* 35.8*  MCV 93.3 93.3 94.2  MCH 32.7 32.3 32.6  MCHC 35.1 34.6 34.6  RDW 13.4 13.2 13.5  PLT 211 197 185   Thyroid  Recent Labs  Lab 10/27/22 2110  TSH 3.290  FREET4 0.71    BNP Recent Labs  Lab 10/27/22 1329  BNP 36.2    DDimer No results for input(s): "DDIMER" in the last 168 hours.   Radiology    ECHOCARDIOGRAM COMPLETE  Result Date: 10/28/2022    ECHOCARDIOGRAM REPORT   Patient Name:   [name] Date of Exam: 10/28/2022 Medical Rec #:  [id number]        Height:       69.0 in Accession #:    [id number]       Weight:       245.1 lb Date of Birth:  07/09/1940        BSA:          2.253 m Patient Age:    81 years         BP:           126/70 mmHg Patient Gender: M                HR:           49 bpm. Exam Location:  Inpatient Procedure: 2D Echo, Cardiac Doppler and Color Doppler Indications:    Chest Pain R07.9  History:        Patient has no prior history of Echocardiogram examinations.                 Angina, Signs/Symptoms:Chest Pain; Risk Factors:Former Smoker                 and Dyslipidemia.  Sonographer:    Aron Baba Referring Phys: [id number] ING[place]  Sonographer  Comments: Suboptimal subcostal window. Image acquisition challenging due to patient body habitus and Image acquisition challenging due to respiratory motion. IMPRESSIONS  1. Left ventricular ejection fraction, by estimation, is 60 to 65%. The left ventricle has normal function. The left ventricle has no regional wall motion abnormalities. There is mild concentric left ventricular hypertrophy. Left ventricular diastolic parameters are consistent with Grade II diastolic dysfunction (pseudonormalization).  2. Right ventricular systolic function is normal. The right ventricular size is normal. Tricuspid regurgitation signal is inadequate for assessing PA pressure.  3. Left atrial size was mildly dilated.  4. The mitral valve is grossly normal. No evidence of mitral valve regurgitation.  5. The aortic valve was not well visualized. There is mild calcification of the aortic valve. Aortic valve regurgitation is not visualized. No aortic stenosis is present. Comparison(s): No prior Echocardiogram. FINDINGS  Left Ventricle: Left ventricular ejection fraction, by estimation, is 60 to 65%. The left ventricle has normal function. The left ventricle has no regional wall motion abnormalities. The left ventricular internal cavity size was normal in size. There is  mild concentric left ventricular hypertrophy. Left ventricular diastolic parameters are consistent with Grade II diastolic dysfunction (pseudonormalization). Right Ventricle: The right ventricular size is normal. No increase in right ventricular wall thickness. Right ventricular systolic function is normal. Tricuspid regurgitation signal is inadequate for assessing PA pressure. Left Atrium: Left atrial size was mildly  dilated. Right Atrium: Right atrial size was normal in size. Pericardium: There is no evidence of pericardial effusion. Mitral Valve: The mitral valve is grossly normal. No evidence of mitral valve regurgitation. Tricuspid Valve: The tricuspid valve is  normal in structure. Tricuspid valve regurgitation is not demonstrated. No evidence of tricuspid stenosis. Aortic Valve: The aortic valve was not well visualized. There is mild calcification of the aortic valve. Aortic valve regurgitation is not visualized. No aortic stenosis is present. Pulmonic Valve: The pulmonic valve was normal in structure. Pulmonic valve regurgitation is not visualized. No evidence of pulmonic stenosis. Aorta: The aortic root and ascending aorta are structurally normal, with no evidence of dilitation. IAS/Shunts: No atrial level shunt detected by color flow Doppler.  LEFT VENTRICLE PLAX 2D LVIDd:         5.30 cm   Diastology LVIDs:         3.60 cm   LV e' medial:    5.27 cm/s LV PW:         1.10 cm   LV E/e' medial:  16.8 LV IVS:        1.10 cm   LV e' lateral:   4.78 cm/s LVOT diam:     2.10 cm   LV E/e' lateral: 18.5 LV SV:         99 LV SV Index:   44 LVOT Area:     3.46 cm  RIGHT VENTRICLE RV S prime:     9.65 cm/s TAPSE (M-mode): 1.7 cm LEFT ATRIUM             Index        RIGHT ATRIUM           Index LA diam:        4.90 cm 2.18 cm/m   RA Area:     15.10 cm LA Vol (A2C):   86.3 ml 38.31 ml/m  RA Volume:   34.30 ml  15.23 ml/m LA Vol (A4C):   63.3 ml 28.10 ml/m LA Biplane Vol: 78.4 ml 34.80 ml/m  AORTIC VALVE             PULMONIC VALVE LVOT Vmax:   120.00 cm/s PR End Diast Vel: 1.52 msec LVOT Vmean:  80.300 cm/s LVOT VTI:    0.287 m  AORTA Ao Root diam: 3.70 cm Ao Asc diam:  3.70 cm MITRAL VALVE MV Area (PHT): 3.89 cm    SHUNTS MV Decel Time: 195 msec    Systemic VTI:  0.29 m MV E velocity: 88.30 cm/s  Systemic Diam: 2.10 cm MV A velocity: 70.30 cm/s MV E/A ratio:  1.26 Riley Lam MD Electronically signed by Riley Lam MD Signature Date/Time: 10/28/2022/4:53:10 PM    Final    CT CORONARY MORPH W/CTA COR W/SCORE Vallarie Mare W/CM &/OR WO/CM  Addendum Date: 10/28/2022   ADDENDUM REPORT: 10/28/2022 13:57 EXAM: OVER-READ INTERPRETATION  CT CHEST The following report is  an over-read performed by radiologist Dr. Kela Millin [facility] on 10/28/2022. This over-read does not include interpretation of cardiac or coronary anatomy or pathology. The coronary calcium score and coronary CTA interpretation by the cardiologist is attached. COMPARISON:  None. FINDINGS: Vascular: There are no significant non-cardiac vascular findings. Mild noncalcified atherosclerosis of the descending thoracic aorta. Mediastinum/Nodes: There are no enlarged lymph nodes.The visualized esophagus demonstrates no significant findings. Small to moderate hiatal hernia. Lungs/Pleura: Clear lungs. No pneumothorax or pleural effusion. Upper abdomen: No acute abnormality. Musculoskeletal/Chest wall: No chest wall abnormality. No acute  or significant osseous findings. IMPRESSION: 1. Small to moderate hiatal hernia. 2.  Aortic Atherosclerosis (ICD10-I70.0). Electronically Signed   By: Obie Dredge M.D.   On: 10/28/2022 13:57   Result Date: 10/28/2022 CLINICAL DATA:  36M with chest pain EXAM: Cardiac/Coronary CTA TECHNIQUE: The patient was scanned on a [employer]. FINDINGS: A 100 kV prospective scan was triggered in the descending thoracic aorta at 111 HU's. Axial non-contrast 3 mm slices were carried out through the heart. The data set was analyzed on a dedicated work station and scored using the Agatson method. Gantry rotation speed was 250 msecs and collimation was .6 mm. No beta blockade and 0.8 mg of sl NTG was given. The 3D data set was reconstructed in 5% intervals of the 35-75% of the R-R cycle. Phases were analyzed on a dedicated work station using MPR, MIP and VRT modes. The patient received 100 cc of contrast. Coronary Arteries:  Normal coronary origin.  Right dominance. RCA is a large dominant artery that gives rise to PDA and PLA. Mixed plaque in ostial RCA causes 50-69% stenosis. Mixed plaque in proximal RCA causes 25-49% stenosis. Noncalcified plaque in distal RCA causes  50-69% stenosis Left main is a large artery that gives rise to LAD and LCX arteries. Short left main. Calcified plaque in left main causes 0-24% stenosis LAD is a large vessel. Calcified plaque in proximal LAD causes 50-69% stenosis. Mixed plaque in mid LAD causes 70-99% stenosis. Mixed plaque in distal LAD causes 50-69% stenosis LCX is a non-dominant artery that gives rise to one large OM1 branch. Mixed plaque in proximal LCX causes 50-69% stenosis. Mixed plaque in proximal OM1 causes 70-99% stenosis Other findings: Left Ventricle: Normal size Left Atrium: Normal size Pulmonary Veins: Normal configuration Right Ventricle: Normal size Right Atrium: Mild enlargement Cardiac valves: Mild AV calcifications Thoracic aorta: Normal size Pulmonary Arteries: Normal size Systemic Veins: Normal drainage Pericardium: Normal thickness IMPRESSION: 1. Coronary calcium score of 1409. This was 77th percentile for age and sex matched control. 2.  Normal coronary origin with right dominance. 3.  Obstructive CAD 4.  Mixed plaque in mid LAD causes severe (70-99%) stenosis 5.  Mixed plaque in proximal OM1 causes severe (70-99%) stenosis 6. Moderate (50-69%) stenosis in ostial RCA, distal RCA, proximal LAD, distal LAD, and proximal LCX 7.  Will send study for CTFFR CAD-RADS 4 Severe stenosis. (70-99% or > 50% left main). Cardiac catheterization is recommended. Consider symptom-guided anti-ischemic pharmacotherapy as well as risk factor modification per guideline directed care. Electronically Signed: By: Epifanio Lesches M.D. On: 10/28/2022 13:46   CT CORONARY FFR DATA PREP & FLUID ANALYSIS  Result Date: 10/28/2022 EXAM: FFRCT ANALYSIS FINDINGS: FFRct analysis was performed on the original cardiac CT angiogram dataset. Diagrammatic representation of the FFRct analysis is provided in a separate PDF document in PACS. This dictation was created using the PDF document and an interactive 3D model of the results. 3D model is not  available in the EMR/PACS. Normal FFR range is >0.80. 1. Left Main: No significant stenosis 2. LAD: CTFFR 0.86 across lesion in proximal LAD, suggesting lesion is not functionally significant. CTFFR falls to 0.66 across lesion in mid LAD suggesting lesion is functionally significant 3. LCX: CTFFR 0.76 across lesion in proximal LCX suggesting lesion is functionally significant. CTFFR 0.62 across lesion in proximal OM1 suggesting lesion is functionally significant. 4. RCA: Not modeled due to artifact IMPRESSION: 1. CTFFR suggests obstructive CAD in mid LAD, proximal LCX, and proximal OM1. RCA was not  modeled due to artifact Electronically Signed   By: Epifanio Lesches M.D.   On: 10/28/2022 13:54   DG Chest 2 View  Result Date: 10/27/2022 CLINICAL DATA:  Chest pain.  Intermittent chest pressure. EXAM: CHEST - 2 VIEW COMPARISON:  Chest radiographs 07/31/2007 FINDINGS: Cardiac silhouette is at the upper limits of normal size. There is again a tortuous ascending and descending thoracic aorta. Focal calcific density overlying the inferior right lung, likely benign calcified granuloma. No focal airspace opacity. No pleural effusion or pneumothorax. Moderate multilevel degenerative disc changes of the thoracic spine. IMPRESSION: No active cardiopulmonary disease. Electronically Signed   By: Neita Garnet M.D.   On: 10/27/2022 14:03    Cardiac Studies   CCTA 10/28/22: 1. Left Main: No significant stenosis   2. LAD: CTFFR 0.86 across lesion in proximal LAD, suggesting lesion is not functionally significant. CTFFR falls to 0.66 across lesion in mid LAD suggesting lesion is functionally significant   3. LCX: CTFFR 0.76 across lesion in proximal LCX suggesting lesion is functionally significant. CTFFR 0.62 across lesion in proximal OM1 suggesting lesion is functionally significant.   4. RCA: Not modeled due to artifact   IMPRESSION: 1. CTFFR suggests obstructive CAD in mid LAD, proximal LCX,  and proximal OM1. RCA was not modeled due to artifact   Echo 10/28/22:   1. Left ventricular ejection fraction, by estimation, is 60 to 65%. The  left ventricle has normal function. The left ventricle has no regional  wall motion abnormalities. There is mild concentric left ventricular  hypertrophy. Left ventricular diastolic  parameters are consistent with Grade II diastolic dysfunction  (pseudonormalization).   2. Right ventricular systolic function is normal. The right ventricular  size is normal. Tricuspid regurgitation signal is inadequate for assessing  PA pressure.   3. Left atrial size was mildly dilated.   4. The mitral valve is grossly normal. No evidence of mitral valve  regurgitation.   5. The aortic valve was not well visualized. There is mild calcification  of the aortic valve. Aortic valve regurgitation is not visualized. No  aortic stenosis is present.   Patient Profile     82 y.o. male with prostate cancer s/p cryoablation 2013, HTN, HLD and remote brain tumor s/p craniotomy, currently admitted under cardiology service for chest pain.   Assessment & Plan    Chest pain CAD by CTA coronary - he presented with chest pressure with exertion and at rest for 2 weeks - given relatively flat troponin trend and nonischemic EKG, he underwent coronary CTA that showed coronary calcium score of 1409 with 70-99% LAD, 70-99% OM1, 50-69% in the ostial RCA with positive FFR in LAD and LCX - cardiac catheterization was recommended - planned for this afternoon - continue ASA, losartan, crestor increased - no BB give HR in the 30s at rest - no chest pain, but on nitro paste   Hypertension - continue 100 mg losartan - BP controlled, but on nitro paste, may need to consider additional agents after angiography   Hyperlipidemia with LDL goal < 70 10/28/2022: Cholesterol 111; HDL 43; LDL Cholesterol 51; Triglycerides 85; VLDL 17 LP (a) pending - continue 40 mg crestor - increased  this admission   ?CKD III sCr 1.25 (1.28) Gentle IV hydration prior to contrast load - did prep him for the possibility of a staged procedure, depending on cath results    For questions or updates, please contact [facility] Please consult [email or web address] for contact info under  Signed, Roe Rutherford Duke, PA  10/29/2022, 8:12 AM    History and all data above reviewed.  Patient examined.  I agree with the findings as above. No further chest pain.  No SOB.   The patient exam reveals COR:RRR  ,  Lungs: Clear  ,  Abd: Positive bowel sounds, no rebound no guarding, Ext No edema  .  All available labs, radiology testing, previous records reviewed. Agree with documented assessment and plan. CAD:  Cath as above.  HTN:  Continue NTG paste currently.    Fayrene Fearing Takao Lizer  12:39 PM  10/29/2022

## 2022-10-29 NOTE — Plan of Care (Signed)

## 2022-10-30 ENCOUNTER — Inpatient Hospital Stay (HOSPITAL_COMMUNITY): Payer: Medicare Other

## 2022-10-30 ENCOUNTER — Encounter (HOSPITAL_COMMUNITY): Payer: Self-pay | Admitting: Cardiology

## 2022-10-30 DIAGNOSIS — I2510 Atherosclerotic heart disease of native coronary artery without angina pectoris: Secondary | ICD-10-CM | POA: Diagnosis not present

## 2022-10-30 DIAGNOSIS — Z01810 Encounter for preprocedural cardiovascular examination: Secondary | ICD-10-CM | POA: Diagnosis not present

## 2022-10-30 DIAGNOSIS — R079 Chest pain, unspecified: Secondary | ICD-10-CM | POA: Diagnosis not present

## 2022-10-30 DIAGNOSIS — I214 Non-ST elevation (NSTEMI) myocardial infarction: Secondary | ICD-10-CM | POA: Diagnosis not present

## 2022-10-30 LAB — CBC
HCT: 34.3 % — ABNORMAL LOW (ref 39.0–52.0)
Hemoglobin: 12.3 g/dL — ABNORMAL LOW (ref 13.0–17.0)
MCH: 33.1 pg (ref 26.0–34.0)
MCHC: 35.9 g/dL (ref 30.0–36.0)
MCV: 92.2 fL (ref 80.0–100.0)
Platelets: 177 10*3/uL (ref 150–400)
RBC: 3.72 MIL/uL — ABNORMAL LOW (ref 4.22–5.81)
RDW: 13.3 % (ref 11.5–15.5)
WBC: 7.8 10*3/uL (ref 4.0–10.5)
nRBC: 0 % (ref 0.0–0.2)

## 2022-10-30 LAB — ABO/RH: ABO/RH(D): A POS

## 2022-10-30 LAB — SURGICAL PCR SCREEN

## 2022-10-30 LAB — HEPARIN LEVEL (UNFRACTIONATED)
Heparin Unfractionated: 0.29 IU/mL — ABNORMAL LOW (ref 0.30–0.70)
Heparin Unfractionated: 0.25 IU/mL — ABNORMAL LOW (ref 0.30–0.70)
Heparin Unfractionated: 0.48 IU/mL (ref 0.30–0.70)

## 2022-10-30 LAB — BASIC METABOLIC PANEL
Anion gap: 8 (ref 5–15)
BUN: 15 mg/dL (ref 8–23)
CO2: 23 mmol/L (ref 22–32)
Calcium: 8.3 mg/dL — ABNORMAL LOW (ref 8.9–10.3)
Chloride: 106 mmol/L (ref 98–111)
Creatinine, Ser: 1.2 mg/dL (ref 0.61–1.24)
GFR, Estimated: >60 mL/min (ref >60)
Glucose, Bld: 152 mg/dL — ABNORMAL HIGH (ref 70–99)
Potassium: 3.6 mmol/L (ref 3.5–5.1)
Sodium: 137 mmol/L (ref 135–145)

## 2022-10-30 LAB — BLOOD GAS, ARTERIAL
Acid-Base Excess: 0.5 mmol/L (ref 0.0–2.0)
Bicarbonate: 24.6 mmol/L (ref 20.0–28.0)
Drawn by: 649921
O2 Saturation: 99.2 %
Patient temperature: 36.8
pCO2 arterial: 37 mmHg (ref 32–48)
pH, Arterial: 7.43 (ref 7.35–7.45)
pO2, Arterial: 89 mmHg (ref 83–108)

## 2022-10-30 LAB — MAGNESIUM: Magnesium: 2 mg/dL (ref 1.7–2.4)

## 2022-10-30 LAB — SARS CORONAVIRUS 2 BY RT PCR: SARS Coronavirus 2 by RT PCR: NEGATIVE

## 2022-10-30 LAB — APTT: aPTT: 75 seconds — ABNORMAL HIGH (ref 24–36)

## 2022-10-30 MED ORDER — ISOSORBIDE MONONITRATE ER 60 MG PO TB24
60.0000 mg | ORAL_TABLET | Freq: Every day | ORAL | Status: DC
  Administered 2022-10-30: 60 mg via ORAL
  Filled 2022-10-30: qty 1

## 2022-10-30 MED ORDER — NITROGLYCERIN IN D5W 200-5 MCG/ML-% IV SOLN
2.0000 ug/min | INTRAVENOUS | Status: DC
  Filled 2022-10-30: qty 250

## 2022-10-30 MED ORDER — TRANEXAMIC ACID (OHS) PUMP PRIME SOLUTION
2.0000 mg/kg | INTRAVENOUS | Status: DC
  Filled 2022-10-30: qty 2.23

## 2022-10-30 MED ORDER — MILRINONE LACTATE IN DEXTROSE 20-5 MG/100ML-% IV SOLN
0.3000 ug/kg/min | INTRAVENOUS | Status: DC
  Filled 2022-10-30: qty 100

## 2022-10-30 MED ORDER — PLASMA-LYTE A IV SOLN
INTRAVENOUS | Status: DC
  Filled 2022-10-30: qty 2.5

## 2022-10-30 MED ORDER — MAGNESIUM SULFATE 50 % IJ SOLN
40.0000 meq | INTRAMUSCULAR | Status: DC
  Filled 2022-10-30: qty 9.85

## 2022-10-30 MED ORDER — DIAZEPAM 2 MG PO TABS
2.0000 mg | ORAL_TABLET | Freq: Once | ORAL | Status: AC
  Administered 2022-10-31: 2 mg via ORAL
  Filled 2022-10-30: qty 1

## 2022-10-30 MED ORDER — CEFAZOLIN SODIUM-DEXTROSE 2-4 GM/100ML-% IV SOLN
2.0000 g | INTRAVENOUS | Status: AC
  Administered 2022-10-31: 2 g via INTRAVENOUS
  Filled 2022-10-30: qty 100

## 2022-10-30 MED ORDER — NITROGLYCERIN 2 % TD OINT: 1.0000 [in_us] | TOPICAL_OINTMENT | Freq: Four times a day (QID) | TRANSDERMAL | Status: DC

## 2022-10-30 MED ORDER — TRANEXAMIC ACID (OHS) BOLUS VIA INFUSION
15.0000 mg/kg | INTRAVENOUS | Status: AC
  Administered 2022-10-31: 1671 mg via INTRAVENOUS
  Filled 2022-10-30: qty 1671

## 2022-10-30 MED ORDER — CHLORHEXIDINE GLUCONATE CLOTH 2 % EX PADS
6.0000 | MEDICATED_PAD | Freq: Every day | CUTANEOUS | Status: AC
  Administered 2022-10-30 – 2022-10-31 (×2): 6 via TOPICAL

## 2022-10-30 MED ORDER — NOREPINEPHRINE 4 MG/250ML-% IV SOLN
0.0000 ug/min | INTRAVENOUS | Status: DC
  Filled 2022-10-30: qty 250

## 2022-10-30 MED ORDER — PHENYLEPHRINE HCL-NACL 20-0.9 MG/250ML-% IV SOLN
30.0000 ug/min | INTRAVENOUS | Status: AC
  Administered 2022-10-31: 40 ug/min via INTRAVENOUS
  Filled 2022-10-30: qty 250

## 2022-10-30 MED ORDER — EPINEPHRINE HCL 5 MG/250ML IV SOLN IN NS
0.0000 ug/min | INTRAVENOUS | Status: DC
  Filled 2022-10-30: qty 250

## 2022-10-30 MED ORDER — TRANEXAMIC ACID 1000 MG/10ML IV SOLN
1.5000 mg/kg/h | INTRAVENOUS | Status: AC
  Administered 2022-10-31: 1.5 mg/kg/h via INTRAVENOUS
  Filled 2022-10-30: qty 25

## 2022-10-30 MED ORDER — VANCOMYCIN HCL 1500 MG/300ML IV SOLN
1500.0000 mg | INTRAVENOUS | Status: AC
  Administered 2022-10-31: 1500 mg via INTRAVENOUS
  Filled 2022-10-30: qty 300

## 2022-10-30 MED ORDER — DEXMEDETOMIDINE HCL IN NACL 400 MCG/100ML IV SOLN
0.1000 ug/kg/h | INTRAVENOUS | Status: AC
  Administered 2022-10-31: .7 ug/kg/h via INTRAVENOUS
  Filled 2022-10-30: qty 100

## 2022-10-30 MED ORDER — POTASSIUM CHLORIDE 2 MEQ/ML IV SOLN
80.0000 meq | INTRAVENOUS | Status: DC
  Filled 2022-10-30: qty 40

## 2022-10-30 MED ORDER — INSULIN REGULAR(HUMAN) IN NACL 100-0.9 UT/100ML-% IV SOLN
INTRAVENOUS | Status: AC
  Administered 2022-10-31: .8 [IU]/h via INTRAVENOUS
  Filled 2022-10-30: qty 100

## 2022-10-30 MED ORDER — BISACODYL 5 MG PO TBEC
5.0000 mg | DELAYED_RELEASE_TABLET | Freq: Once | ORAL | Status: AC
  Administered 2022-10-30: 5 mg via ORAL
  Filled 2022-10-30: qty 1

## 2022-10-30 MED ORDER — HEPARIN 30,000 UNITS/1000 ML (OHS) CELLSAVER SOLUTION
Status: DC
  Filled 2022-10-30: qty 1000

## 2022-10-30 MED ORDER — METOPROLOL TARTRATE 12.5 MG HALF TABLET: 12.5000 mg | ORAL_TABLET | Freq: Once | ORAL | Status: DC

## 2022-10-30 NOTE — TOC Initial Note (Signed)
Transition of Care [facility]) - Initial/Assessment Note    Patient Details  Name: [name] MRN: [id number] Date of Birth: 11-Jun-1941  [facility]) CM/SW Contact:    Gala Lewandowsky, RN Phone Number: 10/30/2022, 3:28 PM  Clinical Narrative: Patient was discussed in progression rounds this morning. PTA patient was independent from home with son. Patient post cath- plan for CABG 10-30-22. Case Manager will continue to follow for transition of care needs as the patient progresses.                  Expected Discharge Plan: Home w Home Health Services Barriers to Discharge: Continued Medical Work up   Patient Goals and CMS Choice Patient states their goals for this hospitalization and ongoing recovery are:: to return home.   Expected Discharge Plan and Services In-house Referral: NA Discharge Planning Services: CM Consult Post Acute Care Choice: Home Health Living arrangements for the past 2 months: Single Family Home    Prior Living Arrangements/Services Living arrangements for the past 2 months: Single Family Home Lives with:: Adult Children Patient language and need for interpreter reviewed:: Yes Do you feel safe going back to the place where you live?: Yes      Need for Family Participation in Patient Care: Yes (Comment) Care giver support system in place?: Yes (comment)   Criminal Activity/Legal Involvement Pertinent to Current Situation/Hospitalization: No - Comment as needed   Permission Sought/Granted Permission sought to share information with : Case Manager, Family Supports    Emotional Assessment Appearance:: Appears stated age Attitude/Demeanor/Rapport: Engaged Affect (typically observed): Appropriate Orientation: : Oriented to Self, Oriented to Place, Oriented to  Time Alcohol / Substance Use: Not Applicable Psych Involvement: No (comment)  Admission diagnosis:  NSTEMI (non-ST elevated myocardial infarction) (HCC) [I21.4] Chest pain,  cardiac [R07.9] Unstable angina (HCC) [I20.0] Patient Active Problem List   Diagnosis Date Noted   Chest pain, cardiac 10/27/2022   Unstable angina (HCC) 10/27/2022   HLD (hyperlipidemia)    Hyperlipidemia 04/16/2015   Cancer (HCC)    Candidal intertrigo 11/05/2012   Hypertension    PCP:  Donita Brooks, MD Pharmacy:   [id number] - [address] ([place]), [place] - [address] [place] ([place]) [place] [address] Phone: [phone] Fax: [phone]  Social Determinants of Health (SDOH) Social History: SDOH Screenings   Alcohol Screen: Low Risk  (12/03/2020)  Depression (PHQ2-9): Low Risk  (10/24/2022)  Tobacco Use: Medium Risk (10/30/2022)  Readmission Risk Interventions     No data to display

## 2022-10-30 NOTE — Progress Notes (Signed)
ANTICOAGULATION CONSULT NOTE  Pharmacy Consult for Heparin Indication: chest pain/ACS  Allergies  Allergen Reactions   Norvasc [Amlodipine] Swelling    Leg swelling   Red Dye Hives    Patient Measurements: Height: 5\' 9"  (175.3 cm) Weight: 111.4 kg (245 lb 8 oz) IBW/kg (Calculated) : 70.7 Heparin Dosing Weight: 96 kg  Vital Signs: Temp: 98.3 F (36.8 C) (05/02 0353) Temp Source: Oral (05/02 0353) BP: 130/62 (05/02 0353) Pulse Rate: 55 (05/02 0353)  Labs: Recent Labs    10/27/22 1329 10/27/22 1329 10/27/22 1533 10/27/22 2110 10/27/22 2223 10/28/22 0307 10/28/22 1410 10/29/22 0417 10/30/22 0542  HGB 14.1  --   --   --   --  12.6*  --  12.4* 12.3*  HCT 40.2  --   --   --   --  36.4*  --  35.8* 34.3*  PLT 211  --   --   --   --  197  --  185 177  LABPROT  --   --   --   --   --   --   --  14.2  --   INR  --   --   --   --   --   --   --  1.1  --   HEPARINUNFRC  --    < >  --   --   --  0.26* 0.60 0.56 0.29*  CREATININE 1.16  --   --   --   --  1.28*  --  1.25*  --   TROPONINIHS 84*  --  71* 92* 93*  --   --   --   --    < > = values in this interval not displayed.     Estimated Creatinine Clearance: 58 mL/min (A) (by C-G formula based on SCr of 1.25 mg/dL (H)).    Assessment: 54 YOM with a history of HTN and HLD presented with chest pain.  Patient was started on IV heparin and then was turned off for cath.  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  Pharmacy consulted to resume IV heparin 2 hours post TR band removal.  Discussed with RN, TR band to be removed at 1900.  5/2 AM update:  Heparin level sub-therapeutic   Goal of Therapy:  Heparin level 0.3-0.7 units/ml Monitor platelets by anticoagulation protocol: Yes   Plan:  Inc heparin to 1400 units/hr Heparin level in 8 hours  Abran Duke, PharmD, BCPS Clinical Pharmacist Phone: [phone]

## 2022-10-30 NOTE — H&P (View-Only) (Signed)
    [address]       [place] [address]             [phone]        Delio L Kulikowski [place] Medical Record #[id number] Date of Birth: 07/13/1940  Referring: Hochrein, James, MD  Primary Care: Pickard, Warren T, MD Primary Cardiologist:James Hochrein, MD  Reason for Consult: Evaluation for potential surgical management of both vessel coronary artery disease after presenting with acute non-ST elevation myocardial infarction.  History of Present Illness:     Mr. Ahnaf Shanahan is an 82-year-old gentleman with a past history notable for hypertension, dyslipidemia, prostate cancer status post cryoablation at [facility] in 2013 and who is a former smoker.  He also has a history of surgery for resection of a brain tumor at [facility] about 40 years ago.  He presented to his PCP, Dr. Warren Pickard of [facility], on 10/24/2022 for evaluation of skin rash to his chest.  During that visit he also mentioned that he had been having exertional chest discomfort off and on for several days.  An EKG was done in the office showing nonspecific ST depressions in aVL.  He was given a prescription for nitroglycerin and a referral to the ambulatory cardiology clinic.  He returned to his PCPs office 3 days later again reporting active chest pain and was advised to go to the emergency room.  He presented to the [place] [facility] at [facility] on 4/29.  Initial EKG showed septal infarct, age undetermined.  Initial high-sensitivity troponin was elevated at 84 with serial studies over the next several hours at 71-92-93.  Chest x-ray showed no active cardiopulmonary disease.  Having ruled in for acute non-ST elevation myocardial infarction, the patient's treatment plan was discussed with Dr. Turner of the cardiology service.  She recommended hospital admission and initiation of heparin infusion.  Mr. Dase was transferred to [facility].  He had no further  chest pain following admission.  A coronary CT was obtained on the day following admission demonstrating a high probability of obstructive disease in the distributions of the LAD, proximal circumflex, proximal OM1.  Echocardiogram was obtained that same day showing normal biventricular function.  The mitral valve and aortic valve showed no significant structural or functional abnormalities.  He was seen by Dr. Hochrein of the cardiology service and left heart catheterization recommended.  That study was performed yesterday confirming high-grade proximal stenoses in the LAD and circumflex coronary arteries along with an 85% stenosis of the proximal the RCA had a 70% stenosis near the ostium and there was a 90% stenosis in the PL just before the vessel trifurcates. Mr. Kirksey has had no further chest pain since admission while on the heparin infusion.  CT surgery has been consulted for consideration of coronary bypass grafting for his severe multivessel coronary artery disease.  Mr. Bardin is alert and comfortable resting in bed with heparin infusing. 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 He lives with one of his sons and also has two other sons in the area who would be willing to assist with his recovery.   In regard to the brain surgery; he said he began having syncopal episodes while working about 40 years ago.  Work-up revealed " a brain tumor" that was resected at [facility]. Mr. Sine does not know the tumor type and other than having occasional episodes of   dizziness, he has had no neurologic problems since the surgery.    Current Activity/ Functional Status: Patient is independent with mobility/ambulation, transfers, ADL's, IADL's.   Zubrod Score: At the time of surgery this patient's most appropriate activity status/level should be described as: []    0    Normal activity, no symptoms [x]    1    Restricted in physical  strenuous activity but ambulatory, able to do out light work []    2    Ambulatory and capable of self care, unable to do work activities, up and about                 more than 50%  Of the time                            []    3    Only limited self care, in bed greater than 50% of waking hours []    4    Completely disabled, no self care, confined to bed or chair []    5    Moribund  Past Medical History:  Diagnosis Date   Brain tumor (HCC)    Cancer (HCC)    prostate s/p cryoablation at [facility] 2/13, Gleason 7 of left lateral base   HLD (hyperlipidemia)    Hypertension     Past Surgical History:  Procedure Laterality Date   APPENDECTOMY     BRAIN SURGERY     LEFT HEART CATH AND CORONARY ANGIOGRAPHY N/A 10/29/2022   Procedure: LEFT HEART CATH AND CORONARY ANGIOGRAPHY;  Surgeon: Harding, David W, MD;  Location: [facility] INVASIVE CV LAB;  Service: Cardiovascular;  Laterality: N/A;    Social History   Tobacco Use  Smoking Status Former  Smokeless Tobacco Never    Social History   Substance and Sexual Activity  Alcohol Use No   Alcohol/week: 0.0 standard drinks of alcohol     Allergies  Allergen Reactions   Norvasc [Amlodipine] Swelling    Leg swelling   Red Dye Hives    Current Facility-Administered Medications  Medication Dose Route Frequency Provider Last Rate Last Admin   0.9 %  sodium chloride infusion   Intravenous Continuous Harding, David W, MD 111 mL/hr at 10/29/22 1445 Restarted at 10/29/22 1445   0.9 %  sodium chloride infusion  250 mL Intravenous PRN Harding, David W, MD       acetaminophen (TYLENOL) tablet 650 mg  650 mg Oral Q4H PRN Harding, David W, MD       aspirin EC tablet 81 mg  81 mg Oral Daily Harding, David W, MD   81 mg at 10/28/22 0833   heparin ADULT infusion 100 units/mL (25000 units/250mL)  1,400 Units/hr Intravenous Continuous Ledford, James L, RPH 14 mL/hr at 10/30/22 0707 1,400 Units/hr at 10/30/22 0707   losartan (COZAAR) tablet 100 mg  100 mg  Oral Daily Harding, David W, MD   100 mg at 10/29/22 0902   nitroGLYCERIN (NITROGLYN) 2 % ointment 1 inch  1 inch Topical Q6H Duke, Angela Nicole, PA       nitroGLYCERIN (NITROSTAT) SL tablet 0.4 mg  0.4 mg Sublingual Q5 Min x 3 PRN Harding, David W, MD       ondansetron (ZOFRAN) injection 4 mg  4 mg Intravenous Q6H PRN Harding, David W, MD       rosuvastatin (CRESTOR) tablet 40 mg  40 mg Oral Daily Harding, David W,   MD   40 mg at 10/29/22 0902   sodium chloride flush (NS) 0.9 % injection 3 mL  3 mL Intravenous Q12H Harding, David W, MD   3 mL at 10/29/22 2120   sodium chloride flush (NS) 0.9 % injection 3 mL  3 mL Intravenous PRN Harding, David W, MD        Medications Prior to Admission  Medication Sig Dispense Refill Last Dose   aspirin EC 81 MG tablet Take 81 mg by mouth at bedtime.   Past Week   losartan (COZAAR) 100 MG tablet Take 1 tablet by mouth once daily (Patient taking differently: Take 100 mg by mouth at bedtime.) 90 tablet 0 Past Week   nitroGLYCERIN (NITROSTAT) 0.4 MG SL tablet Place 1 tablet (0.4 mg total) under the tongue every 5 (five) minutes as needed for chest pain. 50 tablet 3 NEVER   rosuvastatin (CRESTOR) 10 MG tablet Take 1 tablet (10 mg total) by mouth daily. (Patient taking differently: Take 10 mg by mouth at bedtime.) 90 tablet 3 Past Week   triamcinolone cream (KENALOG) 0.1 % Apply 1 Application topically 2 (two) times daily. (Patient taking differently: Apply 1 Application topically 2 (two) times daily as needed (rash).) 30 g 0 Past Week    History reviewed. No pertinent family history.   Review of Systems:       Cardiac Review of Systems: Y or  [    ]= no  Chest Pain [  x  ]  Resting SOB [   ] Exertional SOB  [ x ]  Orthopnea [  ]   Pedal Edema [   ]    Palpitations [  ] Syncope  [  ]   Presyncope [   ]  General Review of Systems: [Y] = yes [  ]=no Constitional: recent weight change [  ]; anorexia [  ]; fatigue [  ]; nausea [  ]; night sweats [  ]; fever [   ]; or chills [  ]                                                               Dental: Last Dentist visit: "a few months ago"  Eye : blurred vision [  ]; diplopia [   ]; vision changes [  ];  Amaurosis fugax[  ]; Resp: cough [  ];  wheezing[  ];  hemoptysis[  ]; shortness of breath[  ]; paroxysmal nocturnal dyspnea[  ]; dyspnea on exertion[  ]; or orthopnea[  ];  GI:  gallstones[  ], vomiting[  ];  dysphagia[  ]; melena[  ];  hematochezia [  ]; heartburn[  ];   Hx of  Colonoscopy[  ]; GU: kidney stones [  ]; hematuria[  ];   dysuria [  ];  nocturia[  ];  history of     obstruction [  ]; urinary frequency [  ]             Skin: rash, swelling[  ];, hair loss[  ];  peripheral edema[  ];  or itching[  ]; Musculosketetal: myalgias[  ];  joint swelling[  ];  joint erythema[  ];  joint pain[  ];  back pain[  ];  Heme/Lymph: bruising[  ];  bleeding[  ];    anemia[  ];  Neuro: TIA[  ];  headaches[  ];  stroke[  ];  vertigo[ x ];  seizures[  ];   paresthesias[  ];  difficulty walking[  ];  Psych:depression[  ]; anxiety[  ];  Endocrine: diabetes[  ];  thyroid dysfunction[  ];                  Physical Exam: BP 136/61 (BP Location: Left Arm)   Pulse (!) 54   Temp 98.3 F (36.8 C) (Oral)   Resp 20   Ht 5' 9" (1.753 m)   Wt 111.4 kg   SpO2 97%   BMI 36.25 kg/m    General appearance: alert, cooperative, and no distress Head: Normocephalic, without obvious abnormality, atraumatic Neck: no adenopathy, no carotid bruit, no JVD, and supple, symmetrical, trachea midline Lymph nodes: no cervical or clavicular ademopathy Resp: breath sounds are clear, full, and equal.  Cardio: bradycardic with HR 55, no murmur GI: soft, normal bowel sounds, no tenderness or palpable mass Extremities: no deformities, all well perfused with palpable distal pulses Neurologic: Grossly normal   Recent Radiology Findings:  CLINICAL DATA:  Chest pain.  Intermittent chest pressure.   EXAM: CHEST - 2 VIEW    COMPARISON:  Chest radiographs 07/31/2007   FINDINGS: Cardiac silhouette is at the upper limits of normal size. There is again a tortuous ascending and descending thoracic aorta. Focal calcific density overlying the inferior right lung, likely benign calcified granuloma. No focal airspace opacity. No pleural effusion or pneumothorax. Moderate multilevel degenerative disc changes of the thoracic spine.   IMPRESSION: No active cardiopulmonary disease.     Electronically Signed   By: Ronald  Viola M.D.   On: 10/27/2022 14:03  Diagnostic Studies & Laboratory data:  LEFT HEART CATH AND CORONARY ANGIOGRAPHY   Conclusion      Ost LAD lesion is 70% stenosed with 70% stenosed side branch in Ost Cx to Prox Cx.   Mid LAD lesion is 80% stenosed.   Prox Cx to Mid Cx lesion is 70% stenosed with 85% stenosed side branch in 1st Mrg.  Mid Cx lesion is 85% stenosed.   Ost RCA lesion is 70% stenosed.  RPAV lesion is 90% stenosed.   LV end diastolic pressure is normal.   There is no aortic valve stenosis.   POST-CATH DIAGNOSES SEVERE MULTIVESSEL DISEASE: Not amenable to PCI Ostial RCA at least 70% stenosis with mild diffuse disease elsewhere but 90% stenosis branch point in the RPA V (very high bifurcation into PAV and PDA) Separate ostia for LAD and LCx: Ostial LAD 70%, mid LAD 80% Ostial and proximal LCx 70% -> continues beyond OM1 followed by 85% stenosis in the mid AVG prior to OM 2; OM 1 has proximal 85% stenosis Normal LVEDP.   RECOMMENDATIONS Return to nursing unit for ongoing care.  Will restart IV heparin CVTS consultation for CABG Continue to push GDMT for CAD.     David Harding, MD  Coronary Findings  Diagnostic Dominance: Right Left Main  Vessel was injected. The vessel originates from a separate ostium.  Ost LM to Ost LAD lesion is 70% stenosed with 70% stenosed side branch in Ost Cx to Prox Cx.    Left Anterior Descending  The vessel exhibits minimal luminal  irregularities.  Mid LAD lesion is 80% stenosed.    First Diagonal Branch  Vessel is small in size.    First Septal Branch  Vessel is small in size.    Third   Diagonal Branch  Vessel is small in size.    Left Circumflex  Vessel is large.  Prox Cx to Mid Cx lesion is 70% stenosed with 85% stenosed side branch in 1st Mrg.  Mid Cx lesion is 85% stenosed.    First Obtuse Marginal Branch  Vessel is large in size.    Second Obtuse Marginal Branch  Vessel is small in size.    Right Coronary Artery  Ost RCA lesion is 70% stenosed.    Acute Marginal Branch  Vessel is small in size.    Right Posterior Descending Artery  Vessel is small in size.    Right Posterior Atrioventricular Artery  Mild sized  RPAV lesion is 90% stenosed.    Intervention   No interventions have been documented.   Wall Motion  No LV gram         Left Heart  Left Ventricle LV end diastolic pressure is normal.  Aortic Valve There is no aortic valve stenosis.   Coronary Diagrams  Diagnostic Dominance: Right        ECHOCARDIOGRAM REPORT       Patient Name:   Yehoshua L Grandt Date of Exam: 10/28/2022  Medical Rec #:  [id number]        Height:       69.0 in  Accession #:    [id number]       Weight:       245.1 lb  Date of Birth:  02/01/1941        BSA:          2.253 m  Patient Age:    81 years         BP:           126/70 mmHg  Patient Gender: M                HR:           49 bpm.  Exam Location:  Inpatient   Procedure: 2D Echo, Cardiac Doppler and Color Doppler   Indications:    Chest Pain R07.9    History:        Patient has no prior history of Echocardiogram  examinations.                 Angina, Signs/Symptoms:Chest Pain; Risk Factors:Former  Smoker                 and Dyslipidemia.    Sonographer:    Norma Walker  Referring Phys: [id number] LAURA R INGOLD     Sonographer Comments: Suboptimal subcostal window. Image acquisition  challenging due to patient body habitus and Image  acquisition challenging  due to respiratory motion.  IMPRESSIONS     1. Left ventricular ejection fraction, by estimation, is 60 to 65%. The  left ventricle has normal function. The left ventricle has no regional  wall motion abnormalities. There is mild concentric left ventricular  hypertrophy. Left ventricular diastolic  parameters are consistent with Grade II diastolic dysfunction  (pseudonormalization).   2. Right ventricular systolic function is normal. The right ventricular  size is normal. Tricuspid regurgitation signal is inadequate for assessing  PA pressure.   3. Left atrial size was mildly dilated.   4. The mitral valve is grossly normal. No evidence of mitral valve  regurgitation.   5. The aortic valve was not well visualized. There is mild calcification  of the aortic valve. Aortic valve regurgitation is not visualized. No  aortic stenosis is present.     Comparison(s): No prior Echocardiogram.   FINDINGS   Left Ventricle: Left ventricular ejection fraction, by estimation, is 60  to 65%. The left ventricle has normal function. The left ventricle has no  regional wall motion abnormalities. The left ventricular internal cavity  size was normal in size. There is   mild concentric left ventricular hypertrophy. Left ventricular diastolic  parameters are consistent with Grade II diastolic dysfunction  (pseudonormalization).   Right Ventricle: The right ventricular size is normal. No increase in  right ventricular wall thickness. Right ventricular systolic function is  normal. Tricuspid regurgitation signal is inadequate for assessing PA  pressure.   Left Atrium: Left atrial size was mildly dilated.   Right Atrium: Right atrial size was normal in size.   Pericardium: There is no evidence of pericardial effusion.   Mitral Valve: The mitral valve is grossly normal. No evidence of mitral  valve regurgitation.   Tricuspid Valve: The tricuspid valve is normal in  structure. Tricuspid  valve regurgitation is not demonstrated. No evidence of tricuspid  stenosis.   Aortic Valve: The aortic valve was not well visualized. There is mild  calcification of the aortic valve. Aortic valve regurgitation is not  visualized. No aortic stenosis is present.   Pulmonic Valve: The pulmonic valve was normal in structure. Pulmonic valve  regurgitation is not visualized. No evidence of pulmonic stenosis.   Aorta: The aortic root and ascending aorta are structurally normal, with  no evidence of dilitation.   IAS/Shunts: No atrial level shunt detected by color flow Doppler.     LEFT VENTRICLE  PLAX 2D  LVIDd:         5.30 cm   Diastology  LVIDs:         3.60 cm   LV e' medial:    5.27 cm/s  LV PW:         1.10 cm   LV E/e' medial:  16.8  LV IVS:        1.10 cm   LV e' lateral:   4.78 cm/s  LVOT diam:     2.10 cm   LV E/e' lateral: 18.5  LV SV:         99  LV SV Index:   44  LVOT Area:     3.46 cm     RIGHT VENTRICLE  RV S prime:     9.65 cm/s  TAPSE (M-mode): 1.7 cm   LEFT ATRIUM             Index        RIGHT ATRIUM           Index  LA diam:        4.90 cm 2.18 cm/m   RA Area:     15.10 cm  LA Vol (A2C):   86.3 ml 38.31 ml/m  RA Volume:   34.30 ml  15.23 ml/m  LA Vol (A4C):   63.3 ml 28.10 ml/m  LA Biplane Vol: 78.4 ml 34.80 ml/m   AORTIC VALVE             PULMONIC VALVE  LVOT Vmax:   120.00 cm/s PR End Diast Vel: 1.52 msec  LVOT Vmean:  80.300 cm/s  LVOT VTI:    0.287 m    AORTA  Ao Root diam: 3.70 cm  Ao Asc diam:  3.70 cm   MITRAL VALVE  MV Area (PHT): 3.89 cm    SHUNTS  MV Decel Time: 195 msec      Systemic VTI:  0.29 m  MV E velocity: 88.30 cm/s  Systemic Diam: 2.10 cm  MV A velocity: 70.30 cm/s  MV E/A ratio:  1.26   Mahesh Chandrasekhar MD  Electronically signed by Mahesh Chandrasekhar MD  Signature Date/Time: 10/28/2022/4:53:10 PM      I have independently reviewed the above radiologic studies and discussed with the patient    Recent Lab Findings: Lab Results  Component Value Date   WBC 7.8 10/30/2022   HGB 12.3 (L) 10/30/2022   HCT 34.3 (L) 10/30/2022   PLT 177 10/30/2022   GLUCOSE 112 (H) 10/29/2022   CHOL 111 10/28/2022   TRIG 85 10/28/2022   HDL 43 10/28/2022   LDLCALC 51 10/28/2022   ALT 55 (H) 10/27/2022   AST 52 (H) 10/27/2022   NA 135 10/29/2022   K 4.0 10/29/2022   CL 105 10/29/2022   CREATININE 1.25 (H) 10/29/2022   BUN 21 10/29/2022   CO2 23 10/29/2022   TSH 3.290 10/27/2022   INR 1.1 10/29/2022   HGBA1C 5.9 (H) 10/27/2022      Assessment / Plan:      -Multivessel coronary artery disease-and chest pain off and on for several days prior to the he is ruled in for non-ST elevation myocardial infarction.  The echocardiogram is showing preserved biventricular function and no significant valvular disease.  Coronary bypass grafting for revascularization.  The procedure was discussed with Mr. Larock along with the expected postoperative course.  He would like for us to proceed with plans for surgery as soon as possible.   -History of hypertension, managed with losartan 100 mg daily prior to admission  -History of dyslipidemia-on rosuvastatin 10 mg at home, increased to 40 mg following admission.  -History of prostate cancer-cryoablation in 2013.  -S/P resection of a brain tumor - per notes in Care Everywhere , this was a Cerebellar hemangioblastoma resected in 1984. He reports having dizziness / vertigo occasionally but otherwise denies any neurologic deficits.   -Stage III CKD-new diagnosis this admission.  He was hydrated with IV fluids prior to his left heart catheterization.  No new labs since contrast exposure yesterday.  Will repeat a BMP this afternoon.  -Tentative plan is for CABG tomorrow by Dr. Kinda Pottle. He will see Mr. Snellings later today and discuss further.     I  spent 30 minutes counseling the patient face to face.   Myron G. Roddenberry, PA-C  10/30/2022 9:38  AM  Patient seen and examined, records and cath images reviewed, history and physical performed.  81 yo man with no prior cardiac history who presents with new onset CP, r/i for non STEMI, and has severe 3 vessel CAD by cath.  :V function preserved and no significant valve pathology by echo.  CABG indicated for survival benefit and relief of symptoms.  I have discussed the general nature of the procedure, including the need for general anesthesia, the incisions to be used, the use of cardiopulmonary bypass and the use of drainage tubes and temporary pacemaker wires with Mr. Volner and his family.  We discussed the expected hospital stay, overall recovery and short and long term outcomes.  I informed them of the indications, risks, benefits and alternatives.  He understands the risks include, but are not limited to death, stroke, MI, DVT/PE, bleeding, possible need for transfusion, infections, cardiac arrhythmias, as well as other organ system dysfunction including respiratory, renal, or GI complications.   He accepts the risks and agrees to proceed.   For   CABG in AM  Jeyren Danowski C. Rox Mcgriff, MD [facility] [phone]        

## 2022-10-30 NOTE — Progress Notes (Addendum)
Rounding Note    Patient Name: [name] Date of Encounter: 10/30/2022  [facility] Cardiologist: Rollene Rotunda, MD   Subjective   No chest pain. Discussed his heart cath and that he will see CT surgery today.  Inpatient Medications    Scheduled Meds:  aspirin EC  81 mg Oral Daily   losartan  100 mg Oral Daily   rosuvastatin  40 mg Oral Daily   sodium chloride flush  3 mL Intravenous Q12H   Continuous Infusions:  sodium chloride 111 mL/hr at 10/29/22 1445   sodium chloride     heparin 1,350 Units/hr (10/30/22 0315)   PRN Meds: sodium chloride, acetaminophen, nitroGLYCERIN, ondansetron (ZOFRAN) IV, sodium chloride flush   Vital Signs    Vitals:   10/29/22 1558 10/29/22 1623 10/29/22 2012 10/30/22 0353  BP: (!) 143/68 122/60 (!) 161/77 130/62  Pulse: 62 (!) 52 66 (!) 55  Resp: 19 18 18 18   Temp:  98.1 F (36.7 C) 98 F (36.7 C) 98.3 F (36.8 C)  TempSrc:  Oral Oral Oral  SpO2: 100%  97%   Weight:    111.4 kg  Height:        Intake/Output Summary (Last 24 hours) at 10/30/2022 0639 Last data filed at 10/30/2022 0315 Gross per 24 hour  Intake 2030.84 ml  Output 1 ml  Net 2029.84 ml      10/30/2022    3:53 AM 10/29/2022    3:19 AM 10/28/2022    5:52 AM  Last 3 Weights  Weight (lbs) 245 lb 8 oz 245 lb 2.4 oz 245 lb 2.4 oz  Weight (kg) 111.358 kg 111.2 kg 111.2 kg      Telemetry    Sinus rhythm to sinus bradycardia 40-60s with PVCs and bigeminy - Personally Reviewed  ECG    No new tracings - Personally Reviewed  Physical Exam   GEN: No acute distress.   Neck: No JVD Cardiac: RRR, no murmurs, rubs, or gallops.  Respiratory: Clear to auscultation bilaterally. GI: Soft, nontender, non-distended  MS: No edema; No deformity. Neuro:  Nonfocal  Psych: Normal affect  Right radial cath site C/D/I  Labs    High Sensitivity Troponin:   Recent Labs  Lab 10/27/22 1329 10/27/22 1533 10/27/22 2110 10/27/22 2223  TROPONINIHS 84* 71* 92*  93*     Chemistry Recent Labs  Lab 10/27/22 1329 10/27/22 2110 10/28/22 0307 10/29/22 0417  NA 137  --  136 135  K 4.5  --  3.8 4.0  CL 103  --  105 105  CO2 24  --  24 23  GLUCOSE 102*  --  113* 112*  BUN 23  --  24* 21  CREATININE 1.16  --  1.28* 1.25*  CALCIUM 9.3  --  8.5* 8.4*  MG  --  2.2  --   --   PROT  --  7.3  --   --   ALBUMIN  --  4.2  --   --   AST  --  52*  --   --   ALT  --  55*  --   --   ALKPHOS  --  75  --   --   BILITOT  --  1.1  --   --   GFRNONAA >60  --  57* 58*  ANIONGAP 10  --  7 7    Lipids  Recent Labs  Lab 10/28/22 0307  CHOL 111  TRIG 85  HDL  43  LDLCALC 51  CHOLHDL 2.6    Hematology Recent Labs  Lab 10/28/22 0307 10/29/22 0417 10/30/22 0542  WBC 6.6 8.1 7.8  RBC 3.90* 3.80* 3.72*  HGB 12.6* 12.4* 12.3*  HCT 36.4* 35.8* 34.3*  MCV 93.3 94.2 92.2  MCH 32.3 32.6 33.1  MCHC 34.6 34.6 35.9  RDW 13.2 13.5 13.3  PLT 197 185 177   Thyroid  Recent Labs  Lab 10/27/22 2110  TSH 3.290  FREET4 0.71    BNP Recent Labs  Lab 10/27/22 1329  BNP 36.2    DDimer No results for input(s): "DDIMER" in the last 168 hours.   Radiology    CARDIAC CATHETERIZATION  Result Date: 10/29/2022   Ost LAD lesion is 70% stenosed with 70% stenosed side branch in Ost Cx to Prox Cx.   Mid LAD lesion is 80% stenosed.   Prox Cx to Mid Cx lesion is 70% stenosed with 85% stenosed side branch in 1st Mrg.  Mid Cx lesion is 85% stenosed.   Ost RCA lesion is 70% stenosed.  RPAV lesion is 90% stenosed.   LV end diastolic pressure is normal.   There is no aortic valve stenosis. POST-CATH DIAGNOSES SEVERE MULTIVESSEL DISEASE: Not amenable to PCI Ostial RCA at least 70% stenosis with mild diffuse disease elsewhere but 90% stenosis branch point in the RPA V (very high bifurcation into PAV and PDA) Separate ostia for LAD and LCx: Ostial LAD 70%, mid LAD 80% Ostial and proximal LCx 70% -> continues beyond OM1 followed by 85% stenosis in the mid AVG prior to OM 2; OM 1  has proximal 85% stenosis Normal LVEDP. RECOMMENDATIONS Return to nursing unit for ongoing care.  Will restart IV heparin CVTS consultation for CABG Continue to push GDMT for CAD. Bryan Lemma, MD   ECHOCARDIOGRAM COMPLETE  Result Date: 10/28/2022    ECHOCARDIOGRAM REPORT   Patient Name:   [name] Date of Exam: 10/28/2022 Medical Rec #:  [id number]        Height:       69.0 in Accession #:    [id number]       Weight:       245.1 lb Date of Birth:  08/16/1940        BSA:          2.253 m Patient Age:    81 years         BP:           126/70 mmHg Patient Gender: M                HR:           49 bpm. Exam Location:  Inpatient Procedure: 2D Echo, Cardiac Doppler and Color Doppler Indications:    Chest Pain R07.9  History:        Patient has no prior history of Echocardiogram examinations.                 Angina, Signs/Symptoms:Chest Pain; Risk Factors:Former Smoker                 and Dyslipidemia.  Sonographer:    Aron Baba Referring Phys: [address] INGOLD  Sonographer Comments: Suboptimal subcostal window. Image acquisition challenging due to patient body habitus and Image acquisition challenging due to respiratory motion. IMPRESSIONS  1. Left ventricular ejection fraction, by estimation, is 60 to 65%. The left ventricle has normal function. The left ventricle has no regional wall motion abnormalities. There is  mild concentric left ventricular hypertrophy. Left ventricular diastolic parameters are consistent with Grade II diastolic dysfunction (pseudonormalization).  2. Right ventricular systolic function is normal. The right ventricular size is normal. Tricuspid regurgitation signal is inadequate for assessing PA pressure.  3. Left atrial size was mildly dilated.  4. The mitral valve is grossly normal. No evidence of mitral valve regurgitation.  5. The aortic valve was not well visualized. There is mild calcification of the aortic valve. Aortic valve regurgitation is not visualized. No aortic  stenosis is present. Comparison(s): No prior Echocardiogram. FINDINGS  Left Ventricle: Left ventricular ejection fraction, by estimation, is 60 to 65%. The left ventricle has normal function. The left ventricle has no regional wall motion abnormalities. The left ventricular internal cavity size was normal in size. There is  mild concentric left ventricular hypertrophy. Left ventricular diastolic parameters are consistent with Grade II diastolic dysfunction (pseudonormalization). Right Ventricle: The right ventricular size is normal. No increase in right ventricular wall thickness. Right ventricular systolic function is normal. Tricuspid regurgitation signal is inadequate for assessing PA pressure. Left Atrium: Left atrial size was mildly dilated. Right Atrium: Right atrial size was normal in size. Pericardium: There is no evidence of pericardial effusion. Mitral Valve: The mitral valve is grossly normal. No evidence of mitral valve regurgitation. Tricuspid Valve: The tricuspid valve is normal in structure. Tricuspid valve regurgitation is not demonstrated. No evidence of tricuspid stenosis. Aortic Valve: The aortic valve was not well visualized. There is mild calcification of the aortic valve. Aortic valve regurgitation is not visualized. No aortic stenosis is present. Pulmonic Valve: The pulmonic valve was normal in structure. Pulmonic valve regurgitation is not visualized. No evidence of pulmonic stenosis. Aorta: The aortic root and ascending aorta are structurally normal, with no evidence of dilitation. IAS/Shunts: No atrial level shunt detected by color flow Doppler.  LEFT VENTRICLE PLAX 2D LVIDd:         5.30 cm   Diastology LVIDs:         3.60 cm   LV e' medial:    5.27 cm/s LV PW:         1.10 cm   LV E/e' medial:  16.8 LV IVS:        1.10 cm   LV e' lateral:   4.78 cm/s LVOT diam:     2.10 cm   LV E/e' lateral: 18.5 LV SV:         99 LV SV Index:   44 LVOT Area:     3.46 cm  RIGHT VENTRICLE RV S prime:      9.65 cm/s TAPSE (M-mode): 1.7 cm LEFT ATRIUM             Index        RIGHT ATRIUM           Index LA diam:        4.90 cm 2.18 cm/m   RA Area:     15.10 cm LA Vol (A2C):   86.3 ml 38.31 ml/m  RA Volume:   34.30 ml  15.23 ml/m LA Vol (A4C):   63.3 ml 28.10 ml/m LA Biplane Vol: 78.4 ml 34.80 ml/m  AORTIC VALVE             PULMONIC VALVE LVOT Vmax:   120.00 cm/s PR End Diast Vel: 1.52 msec LVOT Vmean:  80.300 cm/s LVOT VTI:    0.287 m  AORTA Ao Root diam: 3.70 cm Ao Asc diam:  3.70 cm MITRAL VALVE  MV Area (PHT): 3.89 cm    SHUNTS MV Decel Time: 195 msec    Systemic VTI:  0.29 m MV E velocity: 88.30 cm/s  Systemic Diam: 2.10 cm MV A velocity: 70.30 cm/s MV E/A ratio:  1.26 Riley Lam MD Electronically signed by Riley Lam MD Signature Date/Time: 10/28/2022/4:53:10 PM    Final    CT CORONARY MORPH W/CTA COR W/SCORE Vallarie Mare W/CM &/OR WO/CM  Addendum Date: 10/28/2022   ADDENDUM REPORT: 10/28/2022 13:57 EXAM: OVER-READ INTERPRETATION  CT CHEST The following report is an over-read performed by radiologist Dr. Kela Millin [facility] on 10/28/2022. This over-read does not include interpretation of cardiac or coronary anatomy or pathology. The coronary calcium score and coronary CTA interpretation by the cardiologist is attached. COMPARISON:  None. FINDINGS: Vascular: There are no significant non-cardiac vascular findings. Mild noncalcified atherosclerosis of the descending thoracic aorta. Mediastinum/Nodes: There are no enlarged lymph nodes.The visualized esophagus demonstrates no significant findings. Small to moderate hiatal hernia. Lungs/Pleura: Clear lungs. No pneumothorax or pleural effusion. Upper abdomen: No acute abnormality. Musculoskeletal/Chest wall: No chest wall abnormality. No acute or significant osseous findings. IMPRESSION: 1. Small to moderate hiatal hernia. 2.  Aortic Atherosclerosis (ICD10-I70.0). Electronically Signed   By: Obie Dredge M.D.   On: 10/28/2022  13:57   Result Date: 10/28/2022 CLINICAL DATA:  22M with chest pain EXAM: Cardiac/Coronary CTA TECHNIQUE: The patient was scanned on a [employer]. FINDINGS: A 100 kV prospective scan was triggered in the descending thoracic aorta at 111 HU's. Axial non-contrast 3 mm slices were carried out through the heart. The data set was analyzed on a dedicated work station and scored using the Agatson method. Gantry rotation speed was 250 msecs and collimation was .6 mm. No beta blockade and 0.8 mg of sl NTG was given. The 3D data set was reconstructed in 5% intervals of the 35-75% of the R-R cycle. Phases were analyzed on a dedicated work station using MPR, MIP and VRT modes. The patient received 100 cc of contrast. Coronary Arteries:  Normal coronary origin.  Right dominance. RCA is a large dominant artery that gives rise to PDA and PLA. Mixed plaque in ostial RCA causes 50-69% stenosis. Mixed plaque in proximal RCA causes 25-49% stenosis. Noncalcified plaque in distal RCA causes 50-69% stenosis Left main is a large artery that gives rise to LAD and LCX arteries. Short left main. Calcified plaque in left main causes 0-24% stenosis LAD is a large vessel. Calcified plaque in proximal LAD causes 50-69% stenosis. Mixed plaque in mid LAD causes 70-99% stenosis. Mixed plaque in distal LAD causes 50-69% stenosis LCX is a non-dominant artery that gives rise to one large OM1 branch. Mixed plaque in proximal LCX causes 50-69% stenosis. Mixed plaque in proximal OM1 causes 70-99% stenosis Other findings: Left Ventricle: Normal size Left Atrium: Normal size Pulmonary Veins: Normal configuration Right Ventricle: Normal size Right Atrium: Mild enlargement Cardiac valves: Mild AV calcifications Thoracic aorta: Normal size Pulmonary Arteries: Normal size Systemic Veins: Normal drainage Pericardium: Normal thickness IMPRESSION: 1. Coronary calcium score of 1409. This was 77th percentile for age and sex matched control. 2.  Normal  coronary origin with right dominance. 3.  Obstructive CAD 4.  Mixed plaque in mid LAD causes severe (70-99%) stenosis 5.  Mixed plaque in proximal OM1 causes severe (70-99%) stenosis 6. Moderate (50-69%) stenosis in ostial RCA, distal RCA, proximal LAD, distal LAD, and proximal LCX 7.  Will send study for CTFFR CAD-RADS 4 Severe stenosis. (70-99% or >  50% left main). Cardiac catheterization is recommended. Consider symptom-guided anti-ischemic pharmacotherapy as well as risk factor modification per guideline directed care. Electronically Signed: By: Epifanio Lesches M.D. On: 10/28/2022 13:46   CT CORONARY FFR DATA PREP & FLUID ANALYSIS  Result Date: 10/28/2022 EXAM: FFRCT ANALYSIS FINDINGS: FFRct analysis was performed on the original cardiac CT angiogram dataset. Diagrammatic representation of the FFRct analysis is provided in a separate PDF document in PACS. This dictation was created using the PDF document and an interactive 3D model of the results. 3D model is not available in the EMR/PACS. Normal FFR range is >0.80. 1. Left Main: No significant stenosis 2. LAD: CTFFR 0.86 across lesion in proximal LAD, suggesting lesion is not functionally significant. CTFFR falls to 0.66 across lesion in mid LAD suggesting lesion is functionally significant 3. LCX: CTFFR 0.76 across lesion in proximal LCX suggesting lesion is functionally significant. CTFFR 0.62 across lesion in proximal OM1 suggesting lesion is functionally significant. 4. RCA: Not modeled due to artifact IMPRESSION: 1. CTFFR suggests obstructive CAD in mid LAD, proximal LCX, and proximal OM1. RCA was not modeled due to artifact Electronically Signed   By: Epifanio Lesches M.D.   On: 10/28/2022 13:54    Cardiac Studies   Left heart catheterization     Ost LAD lesion is 70% stenosed with 70% stenosed side branch in Ost Cx to Prox Cx.   Mid LAD lesion is 80% stenosed.   Prox Cx to Mid Cx lesion is 70% stenosed with 85% stenosed side branch  in 1st Mrg.  Mid Cx lesion is 85% stenosed.   Ost RCA lesion is 70% stenosed.  RPAV lesion is 90% stenosed.   LV end diastolic pressure is normal.   There is no aortic valve stenosis.   POST-CATH DIAGNOSES SEVERE MULTIVESSEL DISEASE: Not amenable to PCI Ostial RCA at least 70% stenosis with mild diffuse disease elsewhere but 90% stenosis branch point in the RPA V (very high bifurcation into PAV and PDA) Separate ostia for LAD and LCx: Ostial LAD 70%, mid LAD 80% Ostial and proximal LCx 70% -> continues beyond OM1 followed by 85% stenosis in the mid AVG prior to OM 2; OM 1 has proximal 85% stenosis Normal LVEDP.   RECOMMENDATIONS Return to nursing unit for ongoing care.  Will restart IV heparin CVTS consultation for CABG Continue to push GDMT for CAD.    Diagnostic Dominance: Right   Echo 10/28/22: 1. Left ventricular ejection fraction, by estimation, is 60 to 65%. The  left ventricle has normal function. The left ventricle has no regional  wall motion abnormalities. There is mild concentric left ventricular  hypertrophy. Left ventricular diastolic  parameters are consistent with Grade II diastolic dysfunction  (pseudonormalization).   2. Right ventricular systolic function is normal. The right ventricular  size is normal. Tricuspid regurgitation signal is inadequate for assessing  PA pressure.   3. Left atrial size was mildly dilated.   4. The mitral valve is grossly normal. No evidence of mitral valve  regurgitation.   5. The aortic valve was not well visualized. There is mild calcification  of the aortic valve. Aortic valve regurgitation is not visualized. No  aortic stenosis is present.     Patient Profile     82 y.o. male with prostate cancer s/p cryoablation 2013, HTN, HLD and remote brain tumor s/p craniotomy, currently admitted under cardiology service for chest pain.   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 - consulted CT surgery, message sent this morning - no further chest pain with nitro paste in place - no BB given bradycardia at rest - continue ASA, losartan, and crestor (increased this admission)  Hypertension - BP generally controlled on 100 mg losartan - nitro paste still in place - may consider adding imdur to replace nitro paste  Hypertension with LDL goal < 70 10/28/2022: Cholesterol 111; HDL 43; LDL Cholesterol 51; Triglycerides 85; VLDL 17 LP (a)  Crestor increased to 40 mg this admission  CKD stage III - new diagnosis BMP pending Received gentle IV hydration prior to cath  PVCs, bigeminy Asymptomatic Mg 2.2 on 10/27/22 - will recheck this with an add-on lab BMP pending   For questions or updates, please contact [facility] Please consult [email or web address] for contact info under   Signed, Marcelino Duster, PA  10/30/2022, 6:39 AM    History and all data above reviewed.  Patient examined.  I agree with the findings as above.  He has had no further chest pain.  No SOB. The patient exam reveals COR:RRR  ,  Lungs: Clear  ,  Abd: Positive bowel sounds, no rebound no guarding, Ext No edema, right radial cath site without bleeding or bruising  .  All available labs, radiology testing, previous records reviewed. Agree with documented assessment and plan. CAD:  Unstable angina.  Multivessel disease.  CABG.  Start Imdur and likely will start ARB after CABG.  Follow renal function.      Fayrene Fearing Chandlar Guice  10:17 AM  10/30/2022

## 2022-10-30 NOTE — Progress Notes (Signed)
Pre-CABG study completed.  ° °Please see CV Proc for preliminary results.  ° °Gissella Niblack, RDMS, RVT ° °

## 2022-10-30 NOTE — Care Management Important Message (Signed)
Important Message  Patient Details  Name: [name] MRN: [id number] Date of Birth: 08-08-1940   Medicare Important Message Given:  Yes     Renie Ora 10/30/2022, 10:59 AM

## 2022-10-30 NOTE — Consult Note (Addendum)
[address]       [place] [address]             [phone]        CHUKWUEMEKA ARTOLA [facility] Medical Record #[id number] Date of Birth: [birth year]  Referring: Rollene Rotunda, MD  Primary Care: Donita Brooks, MD Primary Cardiologist:James Hochrein, MD  Reason for Consult: Evaluation for potential surgical management of both vessel coronary artery disease after presenting with acute non-ST elevation myocardial infarction.  History of Present Illness:     Mr. [name] is an 82 year old gentleman with a past history notable for hypertension, dyslipidemia, prostate cancer status post cryoablation at [facility] in 2013 and who is a former smoker.  He also has a history of surgery for resection of a brain tumor at [facility] about 40 years ago.  He presented to his PCP, Dr. Lynnea Ferrier of [facility], on 10/24/2022 for evaluation of skin rash to his chest.  During that visit he also mentioned that he had been having exertional chest discomfort off and on for several days.  An EKG was done in the office showing nonspecific ST depressions in aVL.  He was given a prescription for nitroglycerin and a referral to the ambulatory cardiology clinic.  He returned to his PCPs office 3 days later again reporting active chest pain and was advised to go to the emergency room.  He presented to the [facility] health ED at [facility] on 4/29.  Initial EKG showed septal infarct, age undetermined.  Initial high-sensitivity troponin was elevated at 84 with serial studies over the next several hours at 71-92-93.  Chest x-ray showed no active cardiopulmonary disease.  Having ruled in for acute non-ST elevation myocardial infarction, the patient's treatment plan was discussed with Dr. Mayford Knife of the cardiology service.  She recommended hospital admission and initiation of heparin infusion.  Mr. [name] was transferred to [facility].  He had no further  chest pain following admission.  A coronary CT was obtained on the day following admission demonstrating a high probability of obstructive disease in the distributions of the LAD, proximal circumflex, proximal OM1.  Echocardiogram was obtained that same day showing normal biventricular function.  The mitral valve and aortic valve showed no significant structural or functional abnormalities.  He was seen by Dr. Antoine Poche of the cardiology service and left heart catheterization recommended.  That study was performed yesterday confirming high-grade proximal stenoses in the LAD and circumflex coronary arteries along with an 85% stenosis of the proximal the RCA had a 70% stenosis near the ostium and there was a 90% stenosis in the PL just before the vessel trifurcates. Mr. Cheema has had no further chest pain since admission while on the heparin infusion.  CT surgery has been consulted for consideration of coronary bypass grafting for his severe multivessel coronary artery disease.  Mr. Greenley is alert and comfortable resting in bed with heparin infusing. 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 He lives with one of his sons and also has two other sons in the area who would be willing to assist with his recovery.   In regard to the brain surgery; he said he began having syncopal episodes while working about 40 years ago.  Work-up revealed " a brain tumor" that was resected at [facility]. Mr. [name] does not know the tumor type and other than having occasional episodes of  dizziness, he has had no neurologic problems since the surgery.    Current Activity/ Functional Status: Patient is independent with mobility/ambulation, transfers, ADL's, IADL's.   Zubrod Score: At the time of surgery this patient's most appropriate activity status/level should be described as: []     0    Normal activity, no symptoms [x]     1    Restricted in physical  strenuous activity but ambulatory, able to do out light work []     2    Ambulatory and capable of self care, unable to do work activities, up and about                 more than 50%  Of the time                            []     3    Only limited self care, in bed greater than 50% of waking hours []     4    Completely disabled, no self care, confined to bed or chair []     5    Moribund  Past Medical History:  Diagnosis Date   Brain tumor (HCC)    Cancer [facility])    prostate s/p cryoablation at [facility] 2/13, Gleason 7 of left lateral base   HLD (hyperlipidemia)    Hypertension     Past Surgical History:  Procedure Laterality Date   APPENDECTOMY     BRAIN SURGERY     LEFT HEART CATH AND CORONARY ANGIOGRAPHY N/A 10/29/2022   Procedure: LEFT HEART CATH AND CORONARY ANGIOGRAPHY;  Surgeon: Marykay Lex, MD;  Location: [facility] INVASIVE CV LAB;  Service: Cardiovascular;  Laterality: N/A;    Social History   Tobacco Use  Smoking Status Former  Smokeless Tobacco Never    Social History   Substance and Sexual Activity  Alcohol Use No   Alcohol/week: 0.0 standard drinks of alcohol     Allergies  Allergen Reactions   Norvasc [Amlodipine] Swelling    Leg swelling   Red Dye Hives    Current Facility-Administered Medications  Medication Dose Route Frequency Provider Last Rate Last Admin   0.9 %  sodium chloride infusion   Intravenous Continuous Marykay Lex, MD 111 mL/hr at 10/29/22 1445 Restarted at 10/29/22 1445   0.9 %  sodium chloride infusion  250 mL Intravenous PRN Marykay Lex, MD       acetaminophen (TYLENOL) tablet 650 mg  650 mg Oral Q4H PRN Marykay Lex, MD       aspirin EC tablet 81 mg  81 mg Oral Daily Marykay Lex, MD   81 mg at 10/28/22 1610   heparin ADULT infusion 100 units/mL (25000 units/273mL)  1,400 Units/hr Intravenous Continuous Stevphen Rochester, RPH 14 mL/hr at 10/30/22 0707 1,400 Units/hr at 10/30/22 0707   losartan (COZAAR) tablet 100 mg  100 mg  Oral Daily Marykay Lex, MD   100 mg at 10/29/22 0902   nitroGLYCERIN (NITROGLYN) 2 % ointment 1 inch  1 inch Topical Q6H Duke, Roe Rutherford, PA       nitroGLYCERIN (NITROSTAT) SL tablet 0.4 mg  0.4 mg Sublingual Q5 Min x 3 PRN Marykay Lex, MD       ondansetron [facility]) injection 4 mg  4 mg Intravenous Q6H PRN Marykay Lex, MD       rosuvastatin (CRESTOR) tablet 40 mg  40 mg Oral Daily Marykay Lex,  MD   40 mg at 10/29/22 0902   sodium chloride flush (NS) 0.9 % injection 3 mL  3 mL Intravenous Q12H Marykay Lex, MD   3 mL at 10/29/22 2120   sodium chloride flush (NS) 0.9 % injection 3 mL  3 mL Intravenous PRN Marykay Lex, MD        Medications Prior to Admission  Medication Sig Dispense Refill Last Dose   aspirin EC 81 MG tablet Take 81 mg by mouth at bedtime.   Past Week   losartan (COZAAR) 100 MG tablet Take 1 tablet by mouth once daily (Patient taking differently: Take 100 mg by mouth at bedtime.) 90 tablet 0 Past Week   nitroGLYCERIN (NITROSTAT) 0.4 MG SL tablet Place 1 tablet (0.4 mg total) under the tongue every 5 (five) minutes as needed for chest pain. 50 tablet 3 NEVER   rosuvastatin (CRESTOR) 10 MG tablet Take 1 tablet (10 mg total) by mouth daily. (Patient taking differently: Take 10 mg by mouth at bedtime.) 90 tablet 3 Past Week   triamcinolone cream (KENALOG) 0.1 % Apply 1 Application topically 2 (two) times daily. (Patient taking differently: Apply 1 Application topically 2 (two) times daily as needed (rash).) 30 g 0 Past Week    History reviewed. No pertinent family history.   Review of Systems:       Cardiac Review of Systems: Y or  [    ]= no  Chest Pain [  x  ]  Resting SOB [   ] Exertional SOB  [ x ]  Orthopnea [  ]   Pedal Edema [   ]    Palpitations [  ] Syncope  [  ]   Presyncope [   ]  General Review of Systems: [Y] = yes [  ]=no Constitional: recent weight change [  ]; anorexia [  ]; fatigue [  ]; nausea [  ]; night sweats [  ]; fever [   ]; or chills [  ]                                                               Dental: Last Dentist visit: "a few months ago"  Eye : blurred vision [  ]; diplopia [   ]; vision changes [  ];  Amaurosis fugax[  ]; Resp: cough [  ];  wheezing[  ];  hemoptysis[  ]; shortness of breath[  ]; paroxysmal nocturnal dyspnea[  ]; dyspnea on exertion[  ]; or orthopnea[  ];  GI:  gallstones[  ], vomiting[  ];  dysphagia[  ]; melena[  ];  hematochezia [  ]; heartburn[  ];   Hx of  Colonoscopy[  ]; GU: kidney stones [  ]; hematuria[  ];   dysuria [  ];  nocturia[  ];  history of     obstruction [  ]; urinary frequency [  ]             Skin: rash, swelling[  ];, hair loss[  ];  peripheral edema[  ];  or itching[  ]; Musculosketetal: myalgias[  ];  joint swelling[  ];  joint erythema[  ];  joint pain[  ];  back pain[  ];  Heme/Lymph: bruising[  ];  bleeding[  ];  anemia[  ];  Neuro: TIA[  ];  headaches[  ];  stroke[  ];  vertigo[ x ];  seizures[  ];   paresthesias[  ];  difficulty walking[  ];  Psych:depression[  ]; anxiety[  ];  Endocrine: diabetes[  ];  thyroid dysfunction[  ];                  Physical Exam: BP 136/61 (BP Location: Left Arm)   Pulse (!) 54   Temp 98.3 F (36.8 C) (Oral)   Resp 20   Ht 5\' 9"  (1.753 m)   Wt 111.4 kg   SpO2 97%   BMI 36.25 kg/m    General appearance: alert, cooperative, and no distress Head: Normocephalic, without obvious abnormality, atraumatic Neck: no adenopathy, no carotid bruit, no JVD, and supple, symmetrical, trachea midline Lymph nodes: no cervical or clavicular ademopathy Resp: breath sounds are clear, full, and equal.  Cardio: bradycardic with HR 55, no murmur GI: soft, normal bowel sounds, no tenderness or palpable mass Extremities: no deformities, all well perfused with palpable distal pulses Neurologic: Grossly normal   Recent Radiology Findings:  CLINICAL DATA:  Chest pain.  Intermittent chest pressure.   EXAM: CHEST - 2 VIEW    COMPARISON:  Chest radiographs 07/31/2007   FINDINGS: Cardiac silhouette is at the upper limits of normal size. There is again a tortuous ascending and descending thoracic aorta. Focal calcific density overlying the inferior right lung, likely benign calcified granuloma. No focal airspace opacity. No pleural effusion or pneumothorax. Moderate multilevel degenerative disc changes of the thoracic spine.   IMPRESSION: No active cardiopulmonary disease.     Electronically Signed   By: Neita Garnet M.D.   On: 10/27/2022 14:03  Diagnostic Studies & Laboratory data:  LEFT HEART CATH AND CORONARY ANGIOGRAPHY   Conclusion      Ost LAD lesion is 70% stenosed with 70% stenosed side branch in Ost Cx to Prox Cx.   Mid LAD lesion is 80% stenosed.   Prox Cx to Mid Cx lesion is 70% stenosed with 85% stenosed side branch in 1st Mrg.  Mid Cx lesion is 85% stenosed.   Ost RCA lesion is 70% stenosed.  RPAV lesion is 90% stenosed.   LV end diastolic pressure is normal.   There is no aortic valve stenosis.   POST-CATH DIAGNOSES SEVERE MULTIVESSEL DISEASE: Not amenable to PCI Ostial RCA at least 70% stenosis with mild diffuse disease elsewhere but 90% stenosis branch point in the RPA V (very high bifurcation into PAV and PDA) Separate ostia for LAD and LCx: Ostial LAD 70%, mid LAD 80% Ostial and proximal LCx 70% -> continues beyond OM1 followed by 85% stenosis in the mid AVG prior to OM 2; OM 1 has proximal 85% stenosis Normal LVEDP.   RECOMMENDATIONS Return to nursing unit for ongoing care.  Will restart IV heparin CVTS consultation for CABG Continue to push GDMT for CAD.     Bryan Lemma, MD  Coronary Findings  Diagnostic Dominance: Right Left Main  Vessel was injected. The vessel originates from a separate ostium.  Ost LM to Ost LAD lesion is 70% stenosed with 70% stenosed side branch in Ost Cx to Prox Cx.    Left Anterior Descending  The vessel exhibits minimal luminal  irregularities.  Mid LAD lesion is 80% stenosed.    First Diagonal Branch  Vessel is small in size.    First Septal Branch  Vessel is small in size.    Third  Diagonal Branch  Vessel is small in size.    Left Circumflex  Vessel is large.  Prox Cx to Mid Cx lesion is 70% stenosed with 85% stenosed side branch in 1st Mrg.  Mid Cx lesion is 85% stenosed.    First Obtuse Marginal Branch  Vessel is large in size.    Second Obtuse Marginal Branch  Vessel is small in size.    Right Coronary Artery  Ost RCA lesion is 70% stenosed.    Acute Marginal Branch  Vessel is small in size.    Right Posterior Descending Artery  Vessel is small in size.    Right Posterior Atrioventricular Artery  Mild sized  RPAV lesion is 90% stenosed.    Intervention   No interventions have been documented.   Wall Motion  No LV gram         Left Heart  Left Ventricle LV end diastolic pressure is normal.  Aortic Valve There is no aortic valve stenosis.   Coronary Diagrams  Diagnostic Dominance: Right        ECHOCARDIOGRAM REPORT       Patient Name:   [name] Date of Exam: 10/28/2022  Medical Rec #:  [id number]        Height:       69.0 in  Accession #:    [id number]       Weight:       245.1 lb  Date of Birth:  1940/10/22        BSA:          2.253 m  Patient Age:    81 years         BP:           126/70 mmHg  Patient Gender: M                HR:           49 bpm.  Exam Location:  Inpatient   Procedure: 2D Echo, Cardiac Doppler and Color Doppler   Indications:    Chest Pain R07.9    History:        Patient has no prior history of Echocardiogram  examinations.                 Angina, Signs/Symptoms:Chest Pain; Risk Factors:Former  Smoker                 and Dyslipidemia.    Sonographer:    Aron Baba  Referring Phys: [id number] [place]     Sonographer Comments: Suboptimal subcostal window. Image acquisition  challenging due to patient body habitus and Image  acquisition challenging  due to respiratory motion.  IMPRESSIONS     1. Left ventricular ejection fraction, by estimation, is 60 to 65%. The  left ventricle has normal function. The left ventricle has no regional  wall motion abnormalities. There is mild concentric left ventricular  hypertrophy. Left ventricular diastolic  parameters are consistent with Grade II diastolic dysfunction  (pseudonormalization).   2. Right ventricular systolic function is normal. The right ventricular  size is normal. Tricuspid regurgitation signal is inadequate for assessing  PA pressure.   3. Left atrial size was mildly dilated.   4. The mitral valve is grossly normal. No evidence of mitral valve  regurgitation.   5. The aortic valve was not well visualized. There is mild calcification  of the aortic valve. Aortic valve regurgitation is not visualized. No  aortic stenosis is present.  Comparison(s): No prior Echocardiogram.   FINDINGS   Left Ventricle: Left ventricular ejection fraction, by estimation, is 60  to 65%. The left ventricle has normal function. The left ventricle has no  regional wall motion abnormalities. The left ventricular internal cavity  size was normal in size. There is   mild concentric left ventricular hypertrophy. Left ventricular diastolic  parameters are consistent with Grade II diastolic dysfunction  (pseudonormalization).   Right Ventricle: The right ventricular size is normal. No increase in  right ventricular wall thickness. Right ventricular systolic function is  normal. Tricuspid regurgitation signal is inadequate for assessing PA  pressure.   Left Atrium: Left atrial size was mildly dilated.   Right Atrium: Right atrial size was normal in size.   Pericardium: There is no evidence of pericardial effusion.   Mitral Valve: The mitral valve is grossly normal. No evidence of mitral  valve regurgitation.   Tricuspid Valve: The tricuspid valve is normal in  structure. Tricuspid  valve regurgitation is not demonstrated. No evidence of tricuspid  stenosis.   Aortic Valve: The aortic valve was not well visualized. There is mild  calcification of the aortic valve. Aortic valve regurgitation is not  visualized. No aortic stenosis is present.   Pulmonic Valve: The pulmonic valve was normal in structure. Pulmonic valve  regurgitation is not visualized. No evidence of pulmonic stenosis.   Aorta: The aortic root and ascending aorta are structurally normal, with  no evidence of dilitation.   IAS/Shunts: No atrial level shunt detected by color flow Doppler.     LEFT VENTRICLE  PLAX 2D  LVIDd:         5.30 cm   Diastology  LVIDs:         3.60 cm   LV e' medial:    5.27 cm/s  LV PW:         1.10 cm   LV E/e' medial:  16.8  LV IVS:        1.10 cm   LV e' lateral:   4.78 cm/s  LVOT diam:     2.10 cm   LV E/e' lateral: 18.5  LV SV:         99  LV SV Index:   44  LVOT Area:     3.46 cm     RIGHT VENTRICLE  RV S prime:     9.65 cm/s  TAPSE (M-mode): 1.7 cm   LEFT ATRIUM             Index        RIGHT ATRIUM           Index  LA diam:        4.90 cm 2.18 cm/m   RA Area:     15.10 cm  LA Vol (A2C):   86.3 ml 38.31 ml/m  RA Volume:   34.30 ml  15.23 ml/m  LA Vol (A4C):   63.3 ml 28.10 ml/m  LA Biplane Vol: 78.4 ml 34.80 ml/m   AORTIC VALVE             PULMONIC VALVE  LVOT Vmax:   120.00 cm/s PR End Diast Vel: 1.52 msec  LVOT Vmean:  80.300 cm/s  LVOT VTI:    0.287 m    AORTA  Ao Root diam: 3.70 cm  Ao Asc diam:  3.70 cm   MITRAL VALVE  MV Area (PHT): 3.89 cm    SHUNTS  MV Decel Time: 195 msec  Systemic VTI:  0.29 m  MV E velocity: 88.30 cm/s  Systemic Diam: 2.10 cm  MV A velocity: 70.30 cm/s  MV E/A ratio:  1.26   Riley Lam MD  Electronically signed by Riley Lam MD  Signature Date/Time: 10/28/2022/4:53:10 PM      I have independently reviewed the above radiologic studies and discussed with the patient    Recent Lab Findings: Lab Results  Component Value Date   WBC 7.8 10/30/2022   HGB 12.3 (L) 10/30/2022   HCT 34.3 (L) 10/30/2022   PLT 177 10/30/2022   GLUCOSE 112 (H) 10/29/2022   CHOL 111 10/28/2022   TRIG 85 10/28/2022   HDL 43 10/28/2022   LDLCALC 51 10/28/2022   ALT 55 (H) 10/27/2022   AST 52 (H) 10/27/2022   NA 135 10/29/2022   K 4.0 10/29/2022   CL 105 10/29/2022   CREATININE 1.25 (H) 10/29/2022   BUN 21 10/29/2022   CO2 23 10/29/2022   TSH 3.290 10/27/2022   INR 1.1 10/29/2022   HGBA1C 5.9 (H) 10/27/2022      Assessment / Plan:      -Multivessel coronary artery disease-and chest pain off and on for several days prior to the he is ruled in for non-ST elevation myocardial infarction.  The echocardiogram is showing preserved biventricular function and no significant valvular disease.  Coronary bypass grafting for revascularization.  The procedure was discussed with Mr. Kotarski along with the expected postoperative course.  He would like for [place] to proceed with plans for surgery as soon as possible.   -History of hypertension, managed with losartan 100 mg daily prior to admission  -History of dyslipidemia-on rosuvastatin 10 mg at home, increased to 40 mg following admission.  -History of prostate cancer-cryoablation in 2013.  -S/P resection of a brain tumor - per notes in Care Everywhere , this was a Cerebellar hemangioblastoma resected in 1984. He reports having dizziness / vertigo occasionally but otherwise denies any neurologic deficits.   -Stage III CKD-new diagnosis this admission.  He was hydrated with IV fluids prior to his left heart catheterization.  No new labs since contrast exposure yesterday.  Will repeat a BMP this afternoon.  -Tentative plan is for CABG tomorrow by Dr. Dorris Fetch. He will see Mr. Viverette later today and discuss further.     I  spent 30 minutes counseling the patient face to face.   Leary Roca, PA-C  10/30/2022 9:38  AM  Patient seen and examined, records and cath images reviewed, history and physical performed.  82 yo man with no prior cardiac history who presents with new onset CP, r/i for non STEMI, and has severe 3 vessel CAD by cath.  :V function preserved and no significant valve pathology by echo.  CABG indicated for survival benefit and relief of symptoms.  I have discussed the general nature of the procedure, including the need for general anesthesia, the incisions to be used, the use of cardiopulmonary bypass and the use of drainage tubes and temporary pacemaker wires with Mr. Mccadden and his family.  We discussed the expected hospital stay, overall recovery and short and long term outcomes.  I informed them of the indications, risks, benefits and alternatives.  He understands the risks include, but are not limited to death, stroke, MI, DVT/PE, bleeding, possible need for transfusion, infections, cardiac arrhythmias, as well as other organ system dysfunction including respiratory, renal, or GI complications.   He accepts the risks and agrees to proceed.   For  CABG in AM  Avary Eichenberger C. Dorris Fetch, MD [facility] [phone]

## 2022-10-30 NOTE — Progress Notes (Signed)
ANTICOAGULATION CONSULT NOTE  Pharmacy Consult for Heparin Indication: chest pain/ACS  Allergies  Allergen Reactions   Norvasc [Amlodipine] Swelling    Leg swelling   Red Dye Hives    Patient Measurements: Height: 5\' 9"  (175.3 cm) Weight: 111.4 kg (245 lb 8 oz) IBW/kg (Calculated) : 70.7 Heparin Dosing Weight: 96 kg  Vital Signs: Temp: 98 F (36.7 C) (05/02 1645) Temp Source: Oral (05/02 1645) BP: 113/63 (05/02 1645) Pulse Rate: 47 (05/02 1645)  Labs: Recent Labs     0000 10/27/22 2110 10/27/22 2223 10/28/22 0307 10/28/22 1410 10/29/22 0417 10/30/22 0542 10/30/22 0933 10/30/22 1537  HGB   < >  --   --  12.6*  --  12.4* 12.3*  --   --   HCT  --   --   --  36.4*  --  35.8* 34.3*  --   --   PLT  --   --   --  197  --  185 177  --   --   LABPROT  --   --   --   --   --  14.2  --   --   --   INR  --   --   --   --   --  1.1  --   --   --   HEPARINUNFRC  --   --   --  0.26*   < > 0.56 0.29*  --  0.25*  CREATININE  --   --   --  1.28*  --  1.25*  --  1.20  --   TROPONINIHS  --  92* 93*  --   --   --   --   --   --    < > = values in this interval not displayed.     Estimated Creatinine Clearance: 60.4 mL/min (by C-G formula based on SCr of 1.2 mg/dL).   Assessment: 56 YOM with a history of HTN and HLD presented with chest pain.  Patient was started on IV heparin and then was turned off for cath.  He has severe multivessel disease and CABG planned for 5/3.  Heparin level 0.25 (remains subtherapeutic) on infusion at 1400 units/hr. No issues with line or bleeding reported per RN.  Goal of Therapy:  Heparin level 0.3-0.7 units/ml Monitor platelets by anticoagulation protocol: Yes   Plan:  Increase heparin to 1600 units/hr Heparin level in 8 hours  Christoper Fabian, PharmD, BCPS Please see amion for complete clinical pharmacist phone list 10/30/2022 4:50 PM

## 2022-10-31 ENCOUNTER — Inpatient Hospital Stay (HOSPITAL_COMMUNITY): Payer: Medicare Other | Admitting: Anesthesiology

## 2022-10-31 ENCOUNTER — Other Ambulatory Visit: Payer: Self-pay

## 2022-10-31 ENCOUNTER — Inpatient Hospital Stay (HOSPITAL_COMMUNITY): Payer: Medicare Other

## 2022-10-31 ENCOUNTER — Inpatient Hospital Stay (HOSPITAL_COMMUNITY)
Admission: EM | Disposition: A | Discharge status: Home / Self Care | Payer: Self-pay | Source: Home / Self Care | Attending: Thoracic Surgery (Cardiothoracic Vascular Surgery)

## 2022-10-31 DIAGNOSIS — I2510 Atherosclerotic heart disease of native coronary artery without angina pectoris: Secondary | ICD-10-CM | POA: Diagnosis not present

## 2022-10-31 DIAGNOSIS — I10 Essential (primary) hypertension: Secondary | ICD-10-CM | POA: Diagnosis not present

## 2022-10-31 DIAGNOSIS — I214 Non-ST elevation (NSTEMI) myocardial infarction: Secondary | ICD-10-CM | POA: Diagnosis not present

## 2022-10-31 DIAGNOSIS — Z951 Presence of aortocoronary bypass graft: Secondary | ICD-10-CM

## 2022-10-31 DIAGNOSIS — Z87891 Personal history of nicotine dependence: Secondary | ICD-10-CM

## 2022-10-31 HISTORY — PX: CORONARY ARTERY BYPASS GRAFT: SHX141

## 2022-10-31 HISTORY — PX: TEE WITHOUT CARDIOVERSION: SHX5443

## 2022-10-31 LAB — POCT I-STAT, CHEM 8
BUN: 18 mg/dL (ref 8–23)
BUN: 16 mg/dL (ref 8–23)
BUN: 15 mg/dL (ref 8–23)
BUN: 15 mg/dL (ref 8–23)
BUN: 15 mg/dL (ref 8–23)
Calcium, Ion: 1.22 mmol/L (ref 1.15–1.40)
Calcium, Ion: 1.15 mmol/L (ref 1.15–1.40)
Calcium, Ion: 1.07 mmol/L — ABNORMAL LOW (ref 1.15–1.40)
Calcium, Ion: 1.06 mmol/L — ABNORMAL LOW (ref 1.15–1.40)
Calcium, Ion: 1.04 mmol/L — ABNORMAL LOW (ref 1.15–1.40)
Chloride: 105 mmol/L (ref 98–111)
Chloride: 104 mmol/L (ref 98–111)
Chloride: 101 mmol/L (ref 98–111)
Chloride: 102 mmol/L (ref 98–111)
Chloride: 104 mmol/L (ref 98–111)
Creatinine, Ser: 1.1 mg/dL (ref 0.61–1.24)
Creatinine, Ser: 1 mg/dL (ref 0.61–1.24)
Creatinine, Ser: 1 mg/dL (ref 0.61–1.24)
Creatinine, Ser: 1 mg/dL (ref 0.61–1.24)
Creatinine, Ser: 0.9 mg/dL (ref 0.61–1.24)
Glucose, Bld: 106 mg/dL — ABNORMAL HIGH (ref 70–99)
Glucose, Bld: 102 mg/dL — ABNORMAL HIGH (ref 70–99)
Glucose, Bld: 112 mg/dL — ABNORMAL HIGH (ref 70–99)
Glucose, Bld: 137 mg/dL — ABNORMAL HIGH (ref 70–99)
Glucose, Bld: 155 mg/dL — ABNORMAL HIGH (ref 70–99)
HCT: 30 % — ABNORMAL LOW (ref 39.0–52.0)
HCT: 29 % — ABNORMAL LOW (ref 39.0–52.0)
HCT: 26 % — ABNORMAL LOW (ref 39.0–52.0)
HCT: 25 % — ABNORMAL LOW (ref 39.0–52.0)
HCT: 28 % — ABNORMAL LOW (ref 39.0–52.0)
Hemoglobin: 10.2 g/dL — ABNORMAL LOW (ref 13.0–17.0)
Hemoglobin: 9.9 g/dL — ABNORMAL LOW (ref 13.0–17.0)
Hemoglobin: 8.8 g/dL — ABNORMAL LOW (ref 13.0–17.0)
Hemoglobin: 8.5 g/dL — ABNORMAL LOW (ref 13.0–17.0)
Hemoglobin: 9.5 g/dL — ABNORMAL LOW (ref 13.0–17.0)
Potassium: 3.9 mmol/L (ref 3.5–5.1)
Potassium: 3.9 mmol/L (ref 3.5–5.1)
Potassium: 5 mmol/L (ref 3.5–5.1)
Potassium: 4.8 mmol/L (ref 3.5–5.1)
Potassium: 4.6 mmol/L (ref 3.5–5.1)
Sodium: 138 mmol/L (ref 135–145)
Sodium: 138 mmol/L (ref 135–145)
Sodium: 137 mmol/L (ref 135–145)
Sodium: 137 mmol/L (ref 135–145)
Sodium: 137 mmol/L (ref 135–145)
TCO2: 24 mmol/L (ref 22–32)
TCO2: 25 mmol/L (ref 22–32)
TCO2: 24 mmol/L (ref 22–32)
TCO2: 23 mmol/L (ref 22–32)
TCO2: 24 mmol/L (ref 22–32)

## 2022-10-31 LAB — CBC
HCT: 31.2 % — ABNORMAL LOW (ref 39.0–52.0)
HCT: 32.3 % — ABNORMAL LOW (ref 39.0–52.0)
Hemoglobin: 10.7 g/dL — ABNORMAL LOW (ref 13.0–17.0)
Hemoglobin: 11.1 g/dL — ABNORMAL LOW (ref 13.0–17.0)
MCH: 32.4 pg (ref 26.0–34.0)
MCH: 32.6 pg (ref 26.0–34.0)
MCHC: 34.3 g/dL (ref 30.0–36.0)
MCHC: 34.4 g/dL (ref 30.0–36.0)
MCV: 94.5 fL (ref 80.0–100.0)
MCV: 94.7 fL (ref 80.0–100.0)
Platelets: 123 10*3/uL — ABNORMAL LOW (ref 150–400)
Platelets: 131 10*3/uL — ABNORMAL LOW (ref 150–400)
RBC: 3.3 MIL/uL — ABNORMAL LOW (ref 4.22–5.81)
RBC: 3.41 MIL/uL — ABNORMAL LOW (ref 4.22–5.81)
RDW: 13.3 % (ref 11.5–15.5)
RDW: 13.5 % (ref 11.5–15.5)
WBC: 12.4 10*3/uL — ABNORMAL HIGH (ref 4.0–10.5)
WBC: 11.3 10*3/uL — ABNORMAL HIGH (ref 4.0–10.5)
nRBC: 0 % (ref 0.0–0.2)
nRBC: 0 % (ref 0.0–0.2)

## 2022-10-31 LAB — URINALYSIS, ROUTINE W REFLEX MICROSCOPIC
Bilirubin Urine: NEGATIVE
Glucose, UA: NEGATIVE mg/dL
Hgb urine dipstick: NEGATIVE
Ketones, ur: NEGATIVE mg/dL
Leukocytes,Ua: NEGATIVE
Nitrite: NEGATIVE
Protein, ur: NEGATIVE mg/dL
Specific Gravity, Urine: 1.023 (ref 1.005–1.030)
pH: 5 (ref 5.0–8.0)

## 2022-10-31 LAB — POCT I-STAT 7, (LYTES, BLD GAS, ICA,H+H)
Acid-base deficit: 3 mmol/L — ABNORMAL HIGH (ref 0.0–2.0)
Acid-base deficit: 3 mmol/L — ABNORMAL HIGH (ref 0.0–2.0)
Acid-base deficit: 2 mmol/L (ref 0.0–2.0)
Acid-base deficit: 4 mmol/L — ABNORMAL HIGH (ref 0.0–2.0)
Acid-base deficit: 5 mmol/L — ABNORMAL HIGH (ref 0.0–2.0)
Bicarbonate: 21.5 mmol/L (ref 20.0–28.0)
Bicarbonate: 21.9 mmol/L (ref 20.0–28.0)
Bicarbonate: 22.2 mmol/L (ref 20.0–28.0)
Bicarbonate: 21 mmol/L (ref 20.0–28.0)
Bicarbonate: 20.4 mmol/L (ref 20.0–28.0)
Calcium, Ion: 1.21 mmol/L (ref 1.15–1.40)
Calcium, Ion: 1.01 mmol/L — ABNORMAL LOW (ref 1.15–1.40)
Calcium, Ion: 1.06 mmol/L — ABNORMAL LOW (ref 1.15–1.40)
Calcium, Ion: 1.14 mmol/L — ABNORMAL LOW (ref 1.15–1.40)
Calcium, Ion: 1.15 mmol/L (ref 1.15–1.40)
HCT: 31 % — ABNORMAL LOW (ref 39.0–52.0)
HCT: 25 % — ABNORMAL LOW (ref 39.0–52.0)
HCT: 26 % — ABNORMAL LOW (ref 39.0–52.0)
HCT: 29 % — ABNORMAL LOW (ref 39.0–52.0)
HCT: 32 % — ABNORMAL LOW (ref 39.0–52.0)
Hemoglobin: 10.5 g/dL — ABNORMAL LOW (ref 13.0–17.0)
Hemoglobin: 8.5 g/dL — ABNORMAL LOW (ref 13.0–17.0)
Hemoglobin: 8.8 g/dL — ABNORMAL LOW (ref 13.0–17.0)
Hemoglobin: 9.9 g/dL — ABNORMAL LOW (ref 13.0–17.0)
Hemoglobin: 10.9 g/dL — ABNORMAL LOW (ref 13.0–17.0)
O2 Saturation: 100 %
O2 Saturation: 100 %
O2 Saturation: 100 %
O2 Saturation: 96 %
O2 Saturation: 96 %
Patient temperature: 36.6
Potassium: 3.8 mmol/L (ref 3.5–5.1)
Potassium: 4.1 mmol/L (ref 3.5–5.1)
Potassium: 4.6 mmol/L (ref 3.5–5.1)
Potassium: 4.3 mmol/L (ref 3.5–5.1)
Potassium: 4.5 mmol/L (ref 3.5–5.1)
Sodium: 139 mmol/L (ref 135–145)
Sodium: 136 mmol/L (ref 135–145)
Sodium: 136 mmol/L (ref 135–145)
Sodium: 139 mmol/L (ref 135–145)
Sodium: 138 mmol/L (ref 135–145)
TCO2: 23 mmol/L (ref 22–32)
TCO2: 23 mmol/L (ref 22–32)
TCO2: 23 mmol/L (ref 22–32)
TCO2: 22 mmol/L (ref 22–32)
TCO2: 22 mmol/L (ref 22–32)
pCO2 arterial: 35.9 mmHg (ref 32–48)
pCO2 arterial: 37.2 mmHg (ref 32–48)
pCO2 arterial: 35.6 mmHg (ref 32–48)
pCO2 arterial: 37.5 mmHg (ref 32–48)
pCO2 arterial: 40.1 mmHg (ref 32–48)
pH, Arterial: 7.386 (ref 7.35–7.45)
pH, Arterial: 7.377 (ref 7.35–7.45)
pH, Arterial: 7.403 (ref 7.35–7.45)
pH, Arterial: 7.355 (ref 7.35–7.45)
pH, Arterial: 7.313 — ABNORMAL LOW (ref 7.35–7.45)
pO2, Arterial: 312 mmHg — ABNORMAL HIGH (ref 83–108)
pO2, Arterial: 361 mmHg — ABNORMAL HIGH (ref 83–108)
pO2, Arterial: 410 mmHg — ABNORMAL HIGH (ref 83–108)
pO2, Arterial: 86 mmHg (ref 83–108)
pO2, Arterial: 92 mmHg (ref 83–108)

## 2022-10-31 LAB — BASIC METABOLIC PANEL
Anion gap: 8 (ref 5–15)
BUN: 16 mg/dL (ref 8–23)
CO2: 21 mmol/L — ABNORMAL LOW (ref 22–32)
Calcium: 7.5 mg/dL — ABNORMAL LOW (ref 8.9–10.3)
Chloride: 106 mmol/L (ref 98–111)
Creatinine, Ser: 1.05 mg/dL (ref 0.61–1.24)
GFR, Estimated: >60 mL/min (ref >60)
Glucose, Bld: 118 mg/dL — ABNORMAL HIGH (ref 70–99)
Potassium: 4.4 mmol/L (ref 3.5–5.1)
Sodium: 135 mmol/L (ref 135–145)

## 2022-10-31 LAB — POCT I-STAT EG7
Acid-base deficit: 3 mmol/L — ABNORMAL HIGH (ref 0.0–2.0)
Bicarbonate: 22.5 mmol/L (ref 20.0–28.0)
Calcium, Ion: 1.09 mmol/L — ABNORMAL LOW (ref 1.15–1.40)
HCT: 26 % — ABNORMAL LOW (ref 39.0–52.0)
Hemoglobin: 8.8 g/dL — ABNORMAL LOW (ref 13.0–17.0)
O2 Saturation: 80 %
Potassium: 3.9 mmol/L (ref 3.5–5.1)
Sodium: 138 mmol/L (ref 135–145)
TCO2: 24 mmol/L (ref 22–32)
pCO2, Ven: 40.7 mmHg — ABNORMAL LOW (ref 44–60)
pH, Ven: 7.351 (ref 7.25–7.43)
pO2, Ven: 47 mmHg — ABNORMAL HIGH (ref 32–45)

## 2022-10-31 LAB — PROTIME-INR
INR: 1.4 — ABNORMAL HIGH (ref 0.8–1.2)
Prothrombin Time: 16.7 seconds — ABNORMAL HIGH (ref 11.4–15.2)

## 2022-10-31 LAB — TYPE AND SCREEN: Antibody Screen: NEGATIVE

## 2022-10-31 LAB — APTT: aPTT: 35 seconds (ref 24–36)

## 2022-10-31 LAB — SURGICAL PCR SCREEN
MRSA, PCR: NEGATIVE
Staphylococcus aureus: NEGATIVE

## 2022-10-31 LAB — HEMOGLOBIN AND HEMATOCRIT, BLOOD
HCT: 24.1 % — ABNORMAL LOW (ref 39.0–52.0)
Hemoglobin: 8.7 g/dL — ABNORMAL LOW (ref 13.0–17.0)

## 2022-10-31 LAB — BPAM RBC
Blood Product Expiration Date: 202405202359
ISSUE DATE / TIME: 202405030715
Unit Type and Rh: 6200

## 2022-10-31 LAB — GLUCOSE, CAPILLARY
Glucose-Capillary: 115 mg/dL — ABNORMAL HIGH (ref 70–99)
Glucose-Capillary: 119 mg/dL — ABNORMAL HIGH (ref 70–99)
Glucose-Capillary: 125 mg/dL — ABNORMAL HIGH (ref 70–99)
Glucose-Capillary: 132 mg/dL — ABNORMAL HIGH (ref 70–99)
Glucose-Capillary: 134 mg/dL — ABNORMAL HIGH (ref 70–99)
Glucose-Capillary: 135 mg/dL — ABNORMAL HIGH (ref 70–99)
Glucose-Capillary: 139 mg/dL — ABNORMAL HIGH (ref 70–99)

## 2022-10-31 LAB — ECHO INTRAOPERATIVE TEE

## 2022-10-31 LAB — PLATELET COUNT: Platelets: 122 10*3/uL — ABNORMAL LOW (ref 150–400)

## 2022-10-31 LAB — MAGNESIUM: Magnesium: 3 mg/dL — ABNORMAL HIGH (ref 1.7–2.4)

## 2022-10-31 LAB — PREPARE RBC (CROSSMATCH)

## 2022-10-31 SURGERY — CORONARY ARTERY BYPASS GRAFTING (CABG)
Anesthesia: General | Site: Chest

## 2022-10-31 MED ORDER — LACTATED RINGERS IV SOLN: INTRAVENOUS | Status: DC

## 2022-10-31 MED ORDER — DOCUSATE SODIUM 100 MG PO CAPS
200.0000 mg | ORAL_CAPSULE | Freq: Every day | ORAL | Status: DC
  Administered 2022-11-01 – 2022-11-03 (×3): 200 mg via ORAL
  Filled 2022-10-31 (×3): qty 2

## 2022-10-31 MED ORDER — MIDAZOLAM HCL (PF) 5 MG/ML IJ SOLN
INTRAMUSCULAR | Status: DC | PRN
  Administered 2022-10-31: 2 mg via INTRAVENOUS
  Administered 2022-10-31: 1 mg via INTRAVENOUS
  Administered 2022-10-31: 2 mg via INTRAVENOUS
  Administered 2022-10-31: 1 mg via INTRAVENOUS

## 2022-10-31 MED ORDER — BISACODYL 5 MG PO TBEC
10.0000 mg | DELAYED_RELEASE_TABLET | Freq: Every day | ORAL | Status: DC
  Administered 2022-11-01 – 2022-11-03 (×3): 10 mg via ORAL
  Filled 2022-10-31 (×2): qty 2

## 2022-10-31 MED ORDER — ROCURONIUM BROMIDE 10 MG/ML (PF) SYRINGE
PREFILLED_SYRINGE | INTRAVENOUS | Status: DC | PRN
  Administered 2022-10-31: 50 mg via INTRAVENOUS
  Administered 2022-10-31: 100 mg via INTRAVENOUS
  Administered 2022-10-31: 30 mg via INTRAVENOUS

## 2022-10-31 MED ORDER — PROPOFOL 10 MG/ML IV BOLUS
INTRAVENOUS | Status: AC
  Filled 2022-10-31: qty 20

## 2022-10-31 MED ORDER — ALBUMIN HUMAN 5 % IV SOLN
250.0000 mL | INTRAVENOUS | Status: AC | PRN
  Administered 2022-10-31 – 2022-11-01 (×4): 12.5 g via INTRAVENOUS
  Filled 2022-10-31 (×2): qty 250

## 2022-10-31 MED ORDER — ACETAMINOPHEN 160 MG/5ML PO SOLN: 650.0000 mg | Freq: Once | ORAL | Status: AC

## 2022-10-31 MED ORDER — SODIUM CHLORIDE 0.9 % IV SOLN: INTRAVENOUS | Status: DC

## 2022-10-31 MED ORDER — PROPOFOL 10 MG/ML IV BOLUS
INTRAVENOUS | Status: DC | PRN
  Administered 2022-10-31: 80 mg via INTRAVENOUS

## 2022-10-31 MED ORDER — SODIUM CHLORIDE 0.9% FLUSH
3.0000 mL | Freq: Two times a day (BID) | INTRAVENOUS | Status: DC
  Administered 2022-11-01 – 2022-11-02 (×3): 3 mL via INTRAVENOUS
  Administered 2022-11-02: 10 mL via INTRAVENOUS
  Administered 2022-11-03: 3 mL via INTRAVENOUS

## 2022-10-31 MED ORDER — ASPIRIN 81 MG PO CHEW: 324.0000 mg | CHEWABLE_TABLET | Freq: Every day | ORAL | Status: DC

## 2022-10-31 MED ORDER — METOPROLOL TARTRATE 5 MG/5ML IV SOLN: 2.5000 mg | INTRAVENOUS | Status: DC | PRN

## 2022-10-31 MED ORDER — HEMOSTATIC AGENTS (NO CHARGE) OPTIME
TOPICAL | Status: DC | PRN
  Administered 2022-10-31: 1 via TOPICAL

## 2022-10-31 MED ORDER — DEXTROSE 50 % IV SOLN: 0.0000 mL | INTRAVENOUS | Status: DC | PRN

## 2022-10-31 MED ORDER — FENTANYL CITRATE (PF) 250 MCG/5ML IJ SOLN
INTRAMUSCULAR | Status: AC
  Filled 2022-10-31: qty 5

## 2022-10-31 MED ORDER — ORAL CARE MOUTH RINSE: 15.0000 mL | OROMUCOSAL | Status: DC | PRN

## 2022-10-31 MED ORDER — PHENYLEPHRINE 80 MCG/ML (10ML) SYRINGE FOR IV PUSH (FOR BLOOD PRESSURE SUPPORT)
PREFILLED_SYRINGE | INTRAVENOUS | Status: DC | PRN
  Administered 2022-10-31 (×2): 80 ug via INTRAVENOUS
  Administered 2022-10-31: 40 ug via INTRAVENOUS

## 2022-10-31 MED ORDER — POTASSIUM CHLORIDE 10 MEQ/50ML IV SOLN: 10.0000 meq | INTRAVENOUS | Status: AC

## 2022-10-31 MED ORDER — ORAL CARE MOUTH RINSE
15.0000 mL | OROMUCOSAL | Status: DC
  Administered 2022-10-31 (×2): 15 mL via OROMUCOSAL

## 2022-10-31 MED ORDER — SODIUM CHLORIDE 0.9% FLUSH: 3.0000 mL | INTRAVENOUS | Status: DC | PRN

## 2022-10-31 MED ORDER — CHLORHEXIDINE GLUCONATE 0.12 % MT SOLN
15.0000 mL | OROMUCOSAL | Status: AC
  Administered 2022-10-31: 15 mL via OROMUCOSAL
  Filled 2022-10-31: qty 15

## 2022-10-31 MED ORDER — ROSUVASTATIN CALCIUM 20 MG PO TABS
40.0000 mg | ORAL_TABLET | Freq: Every day | ORAL | Status: DC
  Administered 2022-11-01 – 2022-11-05 (×5): 40 mg via ORAL
  Filled 2022-10-31 (×5): qty 2

## 2022-10-31 MED ORDER — ACETAMINOPHEN 650 MG RE SUPP
650.0000 mg | Freq: Once | RECTAL | Status: AC
  Administered 2022-10-31: 650 mg via RECTAL

## 2022-10-31 MED ORDER — LACTATED RINGERS IV SOLN: INTRAVENOUS | Status: DC | PRN

## 2022-10-31 MED ORDER — ONDANSETRON HCL 4 MG/2ML IJ SOLN: 4.0000 mg | Freq: Four times a day (QID) | INTRAMUSCULAR | Status: DC | PRN

## 2022-10-31 MED ORDER — TRAMADOL HCL 50 MG PO TABS
50.0000 mg | ORAL_TABLET | ORAL | Status: DC | PRN
  Administered 2022-10-31: 100 mg via ORAL
  Administered 2022-11-01: 50 mg via ORAL
  Administered 2022-11-01: 100 mg via ORAL
  Filled 2022-10-31: qty 2
  Filled 2022-10-31: qty 1
  Filled 2022-10-31: qty 2

## 2022-10-31 MED ORDER — CHLORHEXIDINE GLUCONATE CLOTH 2 % EX PADS
6.0000 | MEDICATED_PAD | Freq: Every day | CUTANEOUS | Status: DC
  Administered 2022-11-01 – 2022-11-03 (×3): 6 via TOPICAL

## 2022-10-31 MED ORDER — MIDAZOLAM HCL (PF) 10 MG/2ML IJ SOLN
INTRAMUSCULAR | Status: AC
  Filled 2022-10-31: qty 2

## 2022-10-31 MED ORDER — SODIUM CHLORIDE (PF) 0.9 % IJ SOLN: OROMUCOSAL | Status: DC | PRN

## 2022-10-31 MED ORDER — 0.9 % SODIUM CHLORIDE (POUR BTL) OPTIME
TOPICAL | Status: DC | PRN
  Administered 2022-10-31: 5000 mL

## 2022-10-31 MED ORDER — DEXMEDETOMIDINE HCL IN NACL 400 MCG/100ML IV SOLN
0.0000 ug/kg/h | INTRAVENOUS | Status: DC
  Administered 2022-10-31: 0.7 ug/kg/h via INTRAVENOUS

## 2022-10-31 MED ORDER — PHENYLEPHRINE HCL-NACL 20-0.9 MG/250ML-% IV SOLN: 0.0000 ug/min | INTRAVENOUS | Status: DC

## 2022-10-31 MED ORDER — DIPHENHYDRAMINE HCL 50 MG/ML IJ SOLN
25.0000 mg | Freq: Three times a day (TID) | INTRAMUSCULAR | Status: DC | PRN
  Administered 2022-10-31: 25 mg via INTRAVENOUS
  Filled 2022-10-31: qty 1

## 2022-10-31 MED ORDER — FENTANYL CITRATE (PF) 250 MCG/5ML IJ SOLN
INTRAMUSCULAR | Status: DC | PRN
  Administered 2022-10-31: 150 ug via INTRAVENOUS
  Administered 2022-10-31 (×2): 100 ug via INTRAVENOUS
  Administered 2022-10-31: 50 ug via INTRAVENOUS
  Administered 2022-10-31 (×2): 100 ug via INTRAVENOUS
  Administered 2022-10-31: 150 ug via INTRAVENOUS
  Administered 2022-10-31 (×2): 100 ug via INTRAVENOUS
  Administered 2022-10-31: 50 ug via INTRAVENOUS

## 2022-10-31 MED ORDER — HEPARIN SODIUM (PORCINE) 1000 UNIT/ML IJ SOLN
INTRAMUSCULAR | Status: DC | PRN
  Administered 2022-10-31: 2000 [IU] via INTRAVENOUS
  Administered 2022-10-31: 33000 [IU] via INTRAVENOUS

## 2022-10-31 MED ORDER — NITROGLYCERIN IN D5W 200-5 MCG/ML-% IV SOLN
0.0000 ug/min | INTRAVENOUS | Status: DC
  Administered 2022-10-31: 0 ug/min via INTRAVENOUS

## 2022-10-31 MED ORDER — SODIUM CHLORIDE 0.9 % IV SOLN: 250.0000 mL | INTRAVENOUS | Status: DC

## 2022-10-31 MED ORDER — MIDAZOLAM HCL 2 MG/2ML IJ SOLN
2.0000 mg | INTRAMUSCULAR | Status: DC | PRN
  Administered 2022-10-31 (×2): 2 mg via INTRAVENOUS
  Filled 2022-10-31 (×2): qty 2

## 2022-10-31 MED ORDER — CEFAZOLIN SODIUM-DEXTROSE 2-4 GM/100ML-% IV SOLN
2.0000 g | Freq: Three times a day (TID) | INTRAVENOUS | Status: AC
  Administered 2022-10-31 – 2022-11-02 (×6): 2 g via INTRAVENOUS
  Filled 2022-10-31 (×6): qty 100

## 2022-10-31 MED ORDER — BISACODYL 10 MG RE SUPP: 10.0000 mg | Freq: Every day | RECTAL | Status: DC

## 2022-10-31 MED ORDER — ASPIRIN 325 MG PO TBEC
325.0000 mg | DELAYED_RELEASE_TABLET | Freq: Every day | ORAL | Status: DC
  Administered 2022-11-01 – 2022-11-03 (×3): 325 mg via ORAL
  Filled 2022-10-31 (×3): qty 1

## 2022-10-31 MED ORDER — LACTATED RINGERS IV SOLN
500.0000 mL | Freq: Once | INTRAVENOUS | Status: AC | PRN
  Administered 2022-11-01: 250 mL via INTRAVENOUS

## 2022-10-31 MED ORDER — PROTAMINE SULFATE 10 MG/ML IV SOLN
INTRAVENOUS | Status: DC | PRN
  Administered 2022-10-31: 300 mg via INTRAVENOUS

## 2022-10-31 MED ORDER — OXYCODONE HCL 5 MG PO TABS
5.0000 mg | ORAL_TABLET | ORAL | Status: DC | PRN
  Administered 2022-10-31: 10 mg via ORAL
  Administered 2022-11-01: 5 mg via ORAL
  Administered 2022-11-04: 10 mg via ORAL
  Administered 2022-11-06: 5 mg via ORAL
  Filled 2022-10-31 (×2): qty 2
  Filled 2022-10-31 (×2): qty 1

## 2022-10-31 MED ORDER — PANTOPRAZOLE SODIUM 40 MG PO TBEC
40.0000 mg | DELAYED_RELEASE_TABLET | Freq: Every day | ORAL | Status: DC
  Administered 2022-11-02 – 2022-11-06 (×5): 40 mg via ORAL
  Filled 2022-10-31 (×5): qty 1

## 2022-10-31 MED ORDER — ACETAMINOPHEN 160 MG/5ML PO SOLN: 1000.0000 mg | Freq: Four times a day (QID) | ORAL | Status: AC

## 2022-10-31 MED ORDER — SODIUM CHLORIDE 0.45 % IV SOLN: INTRAVENOUS | Status: DC | PRN

## 2022-10-31 MED ORDER — VANCOMYCIN HCL IN DEXTROSE 1-5 GM/200ML-% IV SOLN
1000.0000 mg | Freq: Once | INTRAVENOUS | Status: AC
  Administered 2022-10-31: 1000 mg via INTRAVENOUS
  Filled 2022-10-31: qty 200

## 2022-10-31 MED ORDER — PLASMA-LYTE A IV SOLN: INTRAVENOUS | Status: DC | PRN

## 2022-10-31 MED ORDER — INSULIN REGULAR(HUMAN) IN NACL 100-0.9 UT/100ML-% IV SOLN: INTRAVENOUS | Status: DC

## 2022-10-31 MED ORDER — METOPROLOL TARTRATE 25 MG/10 ML ORAL SUSPENSION
12.5000 mg | Freq: Two times a day (BID) | ORAL | Status: DC
  Administered 2022-10-31: 12.5 mg
  Filled 2022-10-31 (×4): qty 5
  Filled 2022-10-31: qty 10

## 2022-10-31 MED ORDER — MAGNESIUM SULFATE 4 GM/100ML IV SOLN
4.0000 g | Freq: Once | INTRAVENOUS | Status: AC
  Administered 2022-10-31: 4 g via INTRAVENOUS
  Filled 2022-10-31: qty 100

## 2022-10-31 MED ORDER — MORPHINE SULFATE (PF) 2 MG/ML IV SOLN
1.0000 mg | INTRAVENOUS | Status: DC | PRN
  Administered 2022-10-31 (×4): 2 mg via INTRAVENOUS
  Filled 2022-10-31 (×4): qty 1

## 2022-10-31 MED ORDER — ACETAMINOPHEN 500 MG PO TABS
1000.0000 mg | ORAL_TABLET | Freq: Four times a day (QID) | ORAL | Status: AC
  Administered 2022-10-31 – 2022-11-05 (×17): 1000 mg via ORAL
  Filled 2022-10-31 (×17): qty 2

## 2022-10-31 MED ORDER — SODIUM CHLORIDE 0.9% IV SOLUTION: Freq: Once | INTRAVENOUS | Status: DC

## 2022-10-31 MED ORDER — FAMOTIDINE IN NACL 20-0.9 MG/50ML-% IV SOLN
20.0000 mg | Freq: Two times a day (BID) | INTRAVENOUS | Status: AC
  Administered 2022-10-31 (×2): 20 mg via INTRAVENOUS
  Filled 2022-10-31 (×2): qty 50

## 2022-10-31 MED ORDER — METOPROLOL TARTRATE 12.5 MG HALF TABLET
12.5000 mg | ORAL_TABLET | Freq: Two times a day (BID) | ORAL | Status: DC
  Administered 2022-11-02 – 2022-11-05 (×7): 12.5 mg via ORAL
  Filled 2022-10-31 (×8): qty 1

## 2022-10-31 SURGICAL SUPPLY — 111 items
ADH SKN CLS APL DERMABOND .7 (GAUZE/BANDAGES/DRESSINGS) ×2
ADPR CRDPLG 7.5 .25D 1 LRG Y (ADAPTER)
ADPR TBG 2 MALE LL ART (MISCELLANEOUS)
ANTIFOG SOL W/FOAM PAD STRL (MISCELLANEOUS) ×2
BAG DECANTER FOR FLEXI CONT (MISCELLANEOUS) ×2 IMPLANT
BLADE CLIPPER SURG (BLADE) ×2 IMPLANT
BLADE STERNUM SYSTEM 6 (BLADE) ×2 IMPLANT
BLADE SURG 11 STRL SS (BLADE) IMPLANT
BNDG CMPR 5X4 KNIT ELC UNQ LF (GAUZE/BANDAGES/DRESSINGS) ×2
BNDG CMPR 5X62 HK CLSR LF (GAUZE/BANDAGES/DRESSINGS) ×2
BNDG ELASTIC 4INX 5YD STR LF (GAUZE/BANDAGES/DRESSINGS) IMPLANT
BNDG ELASTIC 4X5.8 VLCR STR LF (GAUZE/BANDAGES/DRESSINGS) ×2 IMPLANT
BNDG ELASTIC 6INX 5YD STR LF (GAUZE/BANDAGES/DRESSINGS) IMPLANT
BNDG ELASTIC 6X5.8 VLCR STR LF (GAUZE/BANDAGES/DRESSINGS) ×2 IMPLANT
BNDG GAUZE DERMACEA FLUFF 4 (GAUZE/BANDAGES/DRESSINGS) ×2 IMPLANT
BNDG GZE DERMACEA 4 6PLY (GAUZE/BANDAGES/DRESSINGS) ×2
CANISTER SUCT 3000ML PPV (MISCELLANEOUS) ×2 IMPLANT
CANNULA EZ GLIDE 8.0 24FR (CANNULA) IMPLANT
CANNULA EZ GLIDE AORTIC 21FR (CANNULA) ×2 IMPLANT
CANNULA GUNDRY RETROGRADE 15FR (MISCELLANEOUS) IMPLANT
CANNULA MC2 2 STG 36/46 NON-V (CANNULA) IMPLANT
CATH CPB KIT HENDRICKSON (MISCELLANEOUS) ×2 IMPLANT
CATH ROBINSON RED A/P 18FR (CATHETERS) ×2 IMPLANT
CATH THORACIC 36FR (CATHETERS) ×2 IMPLANT
CATH THORACIC 36FR RT ANG (CATHETERS) ×2 IMPLANT
CLIP TI MEDIUM 24 (CLIP) IMPLANT
CLIP TI WIDE RED SMALL 24 (CLIP) IMPLANT
CONTAINER PROTECT SURGISLUSH (MISCELLANEOUS) ×4 IMPLANT
DERMABOND ADVANCED .7 DNX12 (GAUZE/BANDAGES/DRESSINGS) IMPLANT
DRAPE CARDIOVASCULAR INCISE (DRAPES) ×2
DRAPE SRG 135X102X78XABS (DRAPES) ×2 IMPLANT
DRAPE WARM FLUID 44X44 (DRAPES) ×2 IMPLANT
DRSG COVADERM 4X14 (GAUZE/BANDAGES/DRESSINGS) ×2 IMPLANT
ELECT BLADE 4.0 EZ CLEAN MEGAD (MISCELLANEOUS) ×2
ELECT REM PT RETURN 9FT ADLT (ELECTROSURGICAL) ×4
ELECTRODE BLDE 4.0 EZ CLN MEGD (MISCELLANEOUS) IMPLANT
ELECTRODE REM PT RTRN 9FT ADLT (ELECTROSURGICAL) ×4 IMPLANT
FELT TEFLON 1X6 (MISCELLANEOUS) ×4 IMPLANT
GAUZE 4X4 16PLY ~~LOC~~+RFID DBL (SPONGE) ×2 IMPLANT
GAUZE SPONGE 4X4 12PLY STRL (GAUZE/BANDAGES/DRESSINGS) ×4 IMPLANT
GLOVE BIO SURGEON STRL SZ 6.5 (GLOVE) IMPLANT
GLOVE BIOGEL PI IND STRL 6 (GLOVE) IMPLANT
GLOVE BIOGEL PI IND STRL 6.5 (GLOVE) IMPLANT
GLOVE SS BIOGEL STRL SZ 7.5 (GLOVE) ×2 IMPLANT
GLOVE SURG SIGNA 7.5 PF LTX (GLOVE) ×6 IMPLANT
GLOVE SURG SS PI 6.5 STRL IVOR (GLOVE) IMPLANT
GOWN STRL REUS W/ TWL LRG LVL3 (GOWN DISPOSABLE) ×8 IMPLANT
GOWN STRL REUS W/ TWL XL LVL3 (GOWN DISPOSABLE) ×4 IMPLANT
GOWN STRL REUS W/TWL LRG LVL3 (GOWN DISPOSABLE) ×12
GOWN STRL REUS W/TWL XL LVL3 (GOWN DISPOSABLE) ×4
HEMOSTAT POWDER SURGIFOAM 1G (HEMOSTASIS) ×6 IMPLANT
HEMOSTAT SURGICEL 2X14 (HEMOSTASIS) ×2 IMPLANT
INSERT FOGARTY XLG (MISCELLANEOUS) IMPLANT
IV ADAPTER SYR DOUBLE MALE LL (MISCELLANEOUS) IMPLANT
KIT BASIN OR (CUSTOM PROCEDURE TRAY) ×2 IMPLANT
KIT SUCTION CATH 14FR (SUCTIONS) ×4 IMPLANT
KIT TURNOVER KIT B (KITS) ×2 IMPLANT
KIT VASOVIEW HEMOPRO 2 VH 4000 (KITS) ×2 IMPLANT
MARKER GRAFT CORONARY BYPASS (MISCELLANEOUS) ×6 IMPLANT
NS IRRIG 1000ML POUR BTL (IV SOLUTION) ×10 IMPLANT
PACK E OPEN HEART (SUTURE) ×2 IMPLANT
PACK OPEN HEART (CUSTOM PROCEDURE TRAY) ×2 IMPLANT
PAD ARMBOARD 7.5X6 YLW CONV (MISCELLANEOUS) ×4 IMPLANT
PAD ELECT DEFIB RADIOL ZOLL (MISCELLANEOUS) ×2 IMPLANT
PENCIL BUTTON HOLSTER BLD 10FT (ELECTRODE) ×2 IMPLANT
POSITIONER HEAD DONUT 9IN (MISCELLANEOUS) ×2 IMPLANT
PUNCH AORTIC ROTATE 4.0MM (MISCELLANEOUS) IMPLANT
PUNCH AORTIC ROTATE 4.5MM 8IN (MISCELLANEOUS) IMPLANT
PUNCH AORTIC ROTATE 5MM 8IN (MISCELLANEOUS) IMPLANT
SET MPS 3-ND DEL (MISCELLANEOUS) IMPLANT
SET Y-VENT ADPTR 7.5 MALE LUER (ADAPTER) IMPLANT
SOLUTION ANTFG W/FOAM PAD STRL (MISCELLANEOUS) IMPLANT
SPONGE T-LAP 18X18 ~~LOC~~+RFID (SPONGE) ×8 IMPLANT
SPONGE T-LAP 4X18 ~~LOC~~+RFID (SPONGE) ×2 IMPLANT
STOPCOCK 4 WAY LG BORE MALE ST (IV SETS) IMPLANT
SUPPORT HEART JANKE-BARRON (MISCELLANEOUS) ×2 IMPLANT
SUT BONE WAX W31G (SUTURE) ×2 IMPLANT
SUT ETHIBOND 2 0 SH (SUTURE) ×4
SUT ETHIBOND 2 0 SH 36X2 (SUTURE) IMPLANT
SUT MNCRL AB 4-0 PS2 18 (SUTURE) IMPLANT
SUT PROLENE 3 0 SH DA (SUTURE) ×2 IMPLANT
SUT PROLENE 4 0 RB 1 (SUTURE) ×10
SUT PROLENE 4 0 SH DA (SUTURE) IMPLANT
SUT PROLENE 4-0 RB1 .5 CRCL 36 (SUTURE) IMPLANT
SUT PROLENE 6 0 C 1 30 (SUTURE) ×4 IMPLANT
SUT PROLENE 7 0 BV1 MDA (SUTURE) ×2 IMPLANT
SUT PROLENE 8 0 BV175 6 (SUTURE) IMPLANT
SUT SILK 2 0 SH CR/8 (SUTURE) IMPLANT
SUT STEEL 6MS V (SUTURE) ×2 IMPLANT
SUT STEEL STERNAL CCS#1 18IN (SUTURE) IMPLANT
SUT STEEL SZ 6 DBL 3X14 BALL (SUTURE) ×2 IMPLANT
SUT VIC AB 1 CTX 36 (SUTURE) ×4
SUT VIC AB 1 CTX36XBRD ANBCTR (SUTURE) ×4 IMPLANT
SUT VIC AB 2-0 CT1 27 (SUTURE) ×2
SUT VIC AB 2-0 CT1 TAPERPNT 27 (SUTURE) IMPLANT
SUT VIC AB 2-0 CTX 27 (SUTURE) IMPLANT
SUT VIC AB 3-0 SH 27 (SUTURE)
SUT VIC AB 3-0 SH 27X BRD (SUTURE) IMPLANT
SUT VIC AB 3-0 X1 27 (SUTURE) IMPLANT
SUT VICRYL 4-0 PS2 18IN ABS (SUTURE) IMPLANT
SYSTEM SAHARA CHEST DRAIN ATS (WOUND CARE) ×2 IMPLANT
TAPE CLOTH SURG 4X10 WHT LF (GAUZE/BANDAGES/DRESSINGS) IMPLANT
TAPE PAPER 2X10 WHT MICROPORE (GAUZE/BANDAGES/DRESSINGS) IMPLANT
TOWEL GREEN STERILE (TOWEL DISPOSABLE) ×2 IMPLANT
TOWEL GREEN STERILE FF (TOWEL DISPOSABLE) ×2 IMPLANT
TRAY FOLEY SLVR 16FR TEMP STAT (SET/KITS/TRAYS/PACK) ×2 IMPLANT
TUBE SUCTION CARDIAC 10FR (CANNULA) IMPLANT
TUBING ART PRESS 48 MALE/FEM (TUBING) IMPLANT
TUBING LAP HI FLOW INSUFFLATIO (TUBING) ×2 IMPLANT
UNDERPAD 30X36 HEAVY ABSORB (UNDERPADS AND DIAPERS) ×2 IMPLANT
WATER STERILE IRR 1000ML POUR (IV SOLUTION) ×4 IMPLANT

## 2022-10-31 NOTE — Anesthesia Procedure Notes (Signed)
Anesthesia Procedure Image    

## 2022-10-31 NOTE — Progress Notes (Addendum)
Weaning initiated at this time. Tolerating it fairly well. Tachypnea noted. No agitation or anxiety evident at this time

## 2022-10-31 NOTE — Anesthesia Procedure Notes (Signed)
Central Venous Catheter Insertion Performed by: Eilene Ghazi, MD, anesthesiologist Start/End5/08/2022 6:50 AM, 10/31/2022 7:10 AM Patient location: Pre-op. Preanesthetic checklist: patient identified, IV checked, site marked, risks and benefits discussed, surgical consent, monitors and equipment checked, pre-op evaluation, timeout performed and anesthesia consent Position: Trendelenburg Lidocaine 1% used for infiltration and patient sedated Hand hygiene performed  and maximum sterile barriers used  Catheter size: 8.5 Fr PA cath was placed.Sheath introducer Swan type:thermodilution PA Cath depth:45 Procedure performed using ultrasound guided technique. Ultrasound Notes:anatomy identified, needle tip was noted to be adjacent to the nerve/plexus identified, no ultrasound evidence of intravascular and/or intraneural injection and image(s) printed for medical record Attempts: 2 Following insertion, line sutured, dressing applied and Biopatch. Post procedure assessment: blood return through all ports, free fluid flow and no air  Patient tolerated the procedure well with no immediate complications.

## 2022-10-31 NOTE — Anesthesia Procedure Notes (Signed)
Procedure Name: Intubation Date/Time: 10/31/2022 7:51 AM  Performed by: Zollie Beckers, CRNAPre-anesthesia Checklist: Patient identified, Emergency Drugs available, Suction available and Patient being monitored Patient Re-evaluated:Patient Re-evaluated prior to induction Oxygen Delivery Method: Circle system utilized Preoxygenation: Pre-oxygenation with 100% oxygen Induction Type: IV induction Ventilation: Mask ventilation without difficulty and Two handed mask ventilation required Laryngoscope Size: Mac and 4 Grade View: Grade II Tube type: Oral Tube size: 8.0 mm Number of attempts: 1 Airway Equipment and Method: Stylet Placement Confirmation: ETT inserted through vocal cords under direct vision, positive ETCO2 and breath sounds checked- equal and bilateral Secured at: 24 cm Tube secured with: Tape Dental Injury: Teeth and Oropharynx as per pre-operative assessment

## 2022-10-31 NOTE — Progress Notes (Signed)
ANTICOAGULATION CONSULT NOTE  Pharmacy Consult for Heparin Indication: chest pain/ACS  Allergies  Allergen Reactions   Norvasc [Amlodipine] Swelling    Leg swelling   Red Dye Hives    Patient Measurements: Height: 5\' 9"  (175.3 cm) Weight: 111.4 kg (245 lb 8 oz) IBW/kg (Calculated) : 70.7 Heparin Dosing Weight: 96 kg  Vital Signs: Temp: 98.2 F (36.8 C) (05/02 2045) Temp Source: Oral (05/02 2045) BP: 115/57 (05/02 2045) Pulse Rate: 49 (05/02 2045)  Labs: Recent Labs    10/28/22 0307 10/28/22 1410 10/29/22 0417 10/30/22 0542 10/30/22 0933 10/30/22 1537 10/30/22 1831 10/30/22 2242  HGB 12.6*  --  12.4* 12.3*  --   --   --   --   HCT 36.4*  --  35.8* 34.3*  --   --   --   --   PLT 197  --  185 177  --   --   --   --   APTT  --   --   --   --   --   --  75*  --   LABPROT  --   --  14.2  --   --   --   --   --   INR  --   --  1.1  --   --   --   --   --   HEPARINUNFRC 0.26*   < > 0.56 0.29*  --  0.25*  --  0.48  CREATININE 1.28*  --  1.25*  --  1.20  --   --   --    < > = values in this interval not displayed.     Estimated Creatinine Clearance: 60.4 mL/min (by C-G formula based on SCr of 1.2 mg/dL).    Assessment: 8 YOM with a history of HTN and HLD presented with chest pain.  Patient was started on IV heparin and then was turned off for cath.  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  Pharmacy consulted to resume IV heparin 2 hours post TR band removal.  Discussed with RN, TR band to be removed at 1900.  5/3 AM update:  Heparin level therapeutic   Goal of Therapy:  Heparin level 0.3-0.7 units/ml Monitor platelets by anticoagulation protocol: Yes   Plan:  Cont heparin 1600 units/hr CABG this AM  Abran Duke, PharmD, BCPS Clinical Pharmacist Phone: [phone]

## 2022-10-31 NOTE — Brief Op Note (Signed)
10/27/2022 - 10/31/2022  12:43 PM  PATIENT:  [name]  82 y.o. male  PRE-OPERATIVE DIAGNOSIS:  CAD  POST-OPERATIVE DIAGNOSIS:  CAD  PROCEDURE:  CORONARY ARTERY BYPASS GRAFTING (CABG) X 5 USING LEFT INTERNAL MAMMARY ARTERY AND ENDOSCOPICALLY HARVESTED RIGHT GREATER SAPHENOUS VEIN TRANSESOPHAGEAL ECHOCARDIOGRAM  -LIMA to LAD -SVG sequential to OM1, OM2, and OM3 -SVG to Acute Marginal Vein harvest time: Vein prep time:  SURGEON:  Surgeon(s) and Role:    * [name], MD - Primary  PHYSICIAN ASSISTANT: Aloha Gell PA-C  ASSISTANTS: Tanda Rockers RNFA   ANESTHESIA:   general  EBL:  {None/Minimal: 21241}   BLOOD ADMINISTERED:{BLOOD GIVEN TYPES AND AMOUNTS:20467}  DRAINS:  Mediastinal and pleural tubes    LOCAL MEDICATIONS USED:  NONE  SPECIMEN:  No Specimen  DISPOSITION OF SPECIMEN:  N/A  COUNTS:  YES  DICTATION: .Dragon Dictation  PLAN OF CARE: Admit to inpatient   PATIENT DISPOSITION:  ICU - intubated and hemodynamically stable.   Delay start of Pharmacological VTE agent (>24hrs) due to surgical blood loss or risk of bleeding: yes

## 2022-10-31 NOTE — Anesthesia Procedure Notes (Signed)
Arterial Line Insertion Start/End5/08/2022 6:55 AM, 10/31/2022 7:00 AM Performed by: CRNA  Patient location: Pre-op. Preanesthetic checklist: patient identified, IV checked, site marked, risks and benefits discussed, surgical consent, monitors and equipment checked, pre-op evaluation, timeout performed and anesthesia consent Lidocaine 1% used for infiltration and patient sedated Right, radial was placed Catheter size: 20 G Hand hygiene performed  and maximum sterile barriers used   Attempts: 1 Procedure performed without using ultrasound guided technique. Following insertion, dressing applied and Biopatch. Post procedure assessment: normal  Patient tolerated the procedure well with no immediate complications.

## 2022-10-31 NOTE — Progress Notes (Signed)
Patient passed all weaning criteria.  Patient pulmonary mechanics are:  NIF: -30 FVC: 1.3L  [name] has an audible Endotracheal tube cuff leak.

## 2022-10-31 NOTE — Progress Notes (Signed)
EVENING ROUNDS NOTE :     [address]       [place] [address]             [phone]                 Day of Surgery Procedure(s) (LRB): CORONARY ARTERY BYPASS GRAFTING (CABG) X 5 USING LEFT INTERNAL MAMMARY ARTERY AND ENDOSCOPICALLY HARVESTED RIGHT GREATER SAPHENOUS VEIN (N/A) TRANSESOPHAGEAL ECHOCARDIOGRAM (N/A)   Total Length of Stay:  LOS: 4 days  Events:   Minimal CT output Vent wean    BP (!) 141/85 (BP Location: Left Arm)   Pulse 80   Temp (!) 97.5 F (36.4 C)   Resp (!) 23   Ht 5\' 9"  (1.753 m)   Wt 111.5 kg   SpO2 100%   BMI 36.30 kg/m   PAP: (22-61)/(4-22) 46/18 CO:  [5 L/min-5.6 L/min] 5.6 L/min CI:  [2.2 L/min/m2-2.5 L/min/m2] 2.5 L/min/m2  Vent Mode: SIMV/PC/PS;PRVC FiO2 (%):  [50 %] 50 % Set Rate:  [16 bmp] 16 bmp Vt Set:  [560 mL] 560 mL PEEP:  [5 cmH20] 5 cmH20 Pressure Support:  [10 cmH20] 10 cmH20 Plateau Pressure:  [21 cmH20] 21 cmH20   sodium chloride 20 mL/hr at 10/31/22 1447   [START ON 11/01/2022] sodium chloride     sodium chloride 10 mL/hr at 10/31/22 1447   albumin human 12.5 g (10/31/22 1526)    ceFAZolin (ANCEF) IV     dexmedetomidine (PRECEDEX) IV infusion 0.7 mcg/kg/hr (10/31/22 1445)   famotidine (PEPCID) IV 20 mg (10/31/22 1445)   insulin 1.9 Units/hr (10/31/22 1342)   lactated ringers     lactated ringers 20 mL/hr at 10/31/22 1520   lactated ringers 20 mL/hr at 10/31/22 1431   magnesium sulfate 4 g (10/31/22 1450)   nitroGLYCERIN 0 mcg/min (10/31/22 1454)   phenylephrine (NEO-SYNEPHRINE) Adult infusion 0 mcg/min (10/31/22 1452)   vancomycin      I/O last 3 completed shifts: In: 1591.5 [P.O.:540; I.V.:1051.5] Out: -       Latest Ref Rng & Units 10/31/2022    1:47 PM 10/31/2022   12:47 PM 10/31/2022   12:44 PM  CBC  WBC 4.0 - 10.5 K/uL 12.4     Hemoglobin 13.0 - 17.0 g/dL 13.0  9.5  8.8   Hematocrit 39.0 - 52.0 % 31.2  28.0  26.0   Platelets 150 - 400 K/uL 123          Latest Ref Rng & Units  10/31/2022   12:47 PM 10/31/2022   12:44 PM 10/31/2022   12:04 PM  BMP  Glucose 70 - 99 mg/dL 865   784   BUN 8 - 23 mg/dL 15   15   Creatinine 6.96 - 1.24 mg/dL 2.95   2.84   Sodium 132 - 145 mmol/L 137  136  137   Potassium 3.5 - 5.1 mmol/L 4.6  4.6  4.8   Chloride 98 - 111 mmol/L 104   102     ABG    Component Value Date/Time   PHART 7.403 10/31/2022 1244   PCO2ART 35.6 10/31/2022 1244   PO2ART 410 (H) 10/31/2022 1244   HCO3 22.2 10/31/2022 1244   TCO2 24 10/31/2022 1247   ACIDBASEDEF 2.0 10/31/2022 1244   O2SAT 100 10/31/2022 1244       Brynda Greathouse, MD 10/31/2022 4:51 PM

## 2022-10-31 NOTE — Hospital Course (Signed)
History of Present Illness:     Mr. [name] is an 82 year old gentleman with a past history notable for hypertension, dyslipidemia, prostate cancer status post cryoablation at [facility] in 2013 and who is a former smoker.  He also has a history of surgery for resection of a brain tumor at [facility] about 40 years ago.  He presented to his PCP, Dr. Lynnea Ferrier of [facility], on 10/24/2022 for evaluation of skin rash to his chest.  During that visit he also mentioned that he had been having exertional chest discomfort off and on for several days.  An EKG was done in the office showing nonspecific ST depressions in aVL.  He was given a prescription for nitroglycerin and a referral to the ambulatory cardiology clinic.  He returned to his PCPs office 3 days later again reporting active chest pain and was advised to go to the emergency room.  He presented to the [facility] at [facility] on 4/29.  Initial EKG showed septal infarct, age undetermined.  Initial high-sensitivity troponin was elevated at 84 with serial studies over the next several hours at 71-92-93.  Chest x-ray showed no active cardiopulmonary disease.  Having ruled in for acute non-ST elevation myocardial infarction, the patient's treatment plan was discussed with Dr. Mayford Knife of the cardiology service.  She recommended hospital admission and initiation of heparin infusion.  Mr. Braam was transferred to [facility].  He had no further chest pain following admission.  A coronary CT was obtained on the day following admission demonstrating a high probability of obstructive disease in the distributions of the LAD, proximal circumflex, proximal OM1.  Echocardiogram was obtained that same day showing normal biventricular function.  The mitral valve and aortic valve showed no significant structural or functional abnormalities.  He was seen by Dr. Antoine Poche of the cardiology service and left heart  catheterization recommended.  That study was performed yesterday confirming high-grade proximal stenoses in the LAD and circumflex coronary arteries along with an 85% stenosis of the proximal the RCA had a 70% stenosis near the ostium and there was a 90% stenosis in the PL just before the vessel trifurcates. Mr. Ogburn has had no further chest pain since admission while on the heparin infusion.  CT surgery has been consulted for consideration of coronary bypass grafting for his severe multivessel coronary artery disease.   Mr. Bregman is alert and comfortable resting in bed with heparin infusing. 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 He lives with one of his sons and also has two other sons in the area who would be willing to assist with his recovery.    In regard to the brain surgery; he said he began having syncopal episodes while working about 40 years ago.  Work-up revealed " a brain tumor" that was resected at [facility]. Mr. Blancett does not know the tumor type and other than having occasional episodes of dizziness, he has had no neurologic problems since the surgery.   Dr. Dorris Fetch reviewed the patient's diagnostic studies and determined he would benefit from surgical intervention. He reviewed the patient's treatment options as well as the risks and benefits of surgery. Mr. Munsey was agreeable to proceed with surgery.   Hospital Course: Mr. Earles was admitted to [facility] on 10/27/22 and remained stable until he was brought to the operating room on 10/31/22. He underwent CABG x 5 utilizing LIMA to LAD, SVG  sequential to OM1, OM2, and OM3, and SVG to Acute Marginal as well as endoscopic harvest of the left greater saphenous vein. He tolerated the procedure well and was transferred to the SICU in stable condition.

## 2022-10-31 NOTE — Progress Notes (Signed)
  Echocardiogram Echocardiogram Transesophageal has been performed.  Delcie Roch 10/31/2022, 10:31 AM

## 2022-10-31 NOTE — Procedures (Addendum)
Extubation Procedure Note  Patient Details:   Name: [name] DOB: February 18, 1941 MRN: [id number]   Airway Documentation:    Vent end date: 10/31/22 Vent end time: 2128   Evaluation  O2 sats: stable throughout Complications: No apparent complications Patient did tolerate procedure well. Bilateral Breath Sounds: Diminished   Ability to speak post-extubation without stridor: Yes   Patient passed all rapid wean protocol extubation criteria. Extubation procedure clearly explained to the patient. Patient nodded his head for understanding. Patient extubated to a 4L [place] with bubble humidification. No complications noted with extubation.   Benjamine Sprague, BS, RRT-ACCS, RCP 10/31/2022, 9:50 PM

## 2022-10-31 NOTE — Transfer of Care (Signed)
Immediate Anesthesia Transfer of Care Note  Patient: [name]  Procedure(s) Performed: CORONARY ARTERY BYPASS GRAFTING (CABG) X 5 USING LEFT INTERNAL MAMMARY ARTERY AND ENDOSCOPICALLY HARVESTED RIGHT GREATER SAPHENOUS VEIN (Chest) TRANSESOPHAGEAL ECHOCARDIOGRAM  Patient Location: ICU  Anesthesia Type:General  Level of Consciousness: Patient remains intubated per anesthesia plan  Airway & Oxygen Therapy: Patient remains intubated per anesthesia plan and Patient placed on Ventilator (see vital sign flow sheet for setting)  Post-op Assessment: Report given to RN and Post -op Vital signs reviewed and stable  Post vital signs: Reviewed and stable  Last Vitals:  Vitals Value Taken Time  BP 103/67 10/31/22 1351  Temp 36.6 C 10/31/22 1358  Pulse 80 10/31/22 1358  Resp 16 10/31/22 1358  SpO2 94 % 10/31/22 1358  Vitals shown include unvalidated device data.  Last Pain:  Vitals:   10/31/22 0506  TempSrc: Oral  PainSc:       Patients Stated Pain Goal: 0 (10/30/22 2056)  Complications: No notable events documented.

## 2022-10-31 NOTE — Progress Notes (Signed)
Patient placed on rapid wean without success. Patient became tachypneic in the 40's. RN aware. RT will attempt again.

## 2022-10-31 NOTE — Interval H&P Note (Signed)
History and Physical Interval Note:  10/31/2022 7:05 AM  [name]  has presented today for surgery, with the diagnosis of CAD.  The various methods of treatment have been discussed with the patient and family. After consideration of risks, benefits and other options for treatment, the patient has consented to  Procedure(s): CORONARY ARTERY BYPASS GRAFTING (CABG) (N/A) TRANSESOPHAGEAL ECHOCARDIOGRAM (N/A) as a surgical intervention.  The patient's history has been reviewed, patient examined, no change in status, stable for surgery.  I have reviewed the patient's chart and labs.  Questions were answered to the patient's satisfaction.     [name]

## 2022-10-31 NOTE — Anesthesia Preprocedure Evaluation (Signed)
Anesthesia Evaluation    Airway Mallampati: II  TM Distance: >3 FB Neck ROM: Full    Dental no notable dental hx.    Pulmonary former smoker   Pulmonary exam normal breath sounds clear to auscultation       Cardiovascular hypertension, + CAD  Normal cardiovascular exam Rhythm:Regular Rate:Normal  Left Ventricle: Left ventricular ejection fraction, by estimation, is 60  to 65%. The left ventricle has normal function. The left ventricle has no  regional wall motion abnormalities. The left ventricular internal cavity  size was normal in size. There is   mild concentric left ventricular hypertrophy. Left ventricular diastolic  parameters are consistent with Grade II diastolic dysfunction  (pseudonormalization).   Right Ventricle: The right ventricular size is normal. No increase in  right ventricular wall thickness. Right ventricular systolic function is  normal. Tricuspid regurgitation signal is inadequate for assessing PA  pressure.   Left Atrium: Left atrial size was mildly dilated.   Right Atrium: Right atrial size was normal in size.   Pericardium: There is no evidence of pericardial effusion.   Mitral Valve: The mitral valve is grossly normal. No evidence of mitral  valve regurgitation.   Tricuspid Valve: The tricuspid valve is normal in structure. Tricuspid  valve regurgitation is not demonstrated. No evidence of tricuspid  stenosis.   Aortic Valve: The aortic valve was not well visualized. There is mild  calcification of the aortic valve. Aortic valve regurgitation is not  visualized. No aortic stenosis is present.   Pulmonic Valve: The pulmonic valve was normal in structure. Pulmonic valve  regurgitation is not visualized. No evidence of pulmonic stenosis.   Aorta: The aortic root and ascending aorta are structurally normal, with  no evidence of dilitation.   IAS/Shunts: No atrial level shunt detected by color  flow Doppler.      Neuro/Psych    GI/Hepatic   Endo/Other    Renal/GU      Musculoskeletal   Abdominal   Peds  Hematology   Anesthesia Other Findings   Reproductive/Obstetrics                             Anesthesia Physical Anesthesia Plan  ASA: 3  Anesthesia Plan: General   Post-op Pain Management: Minimal or no pain anticipated   Induction: Intravenous  PONV Risk Score and Plan: 2 and Ondansetron, Dexamethasone and Treatment may vary due to age or medical condition  Airway Management Planned: Oral ETT  Additional Equipment: Arterial line, CVP, PA Cath, TEE and Ultrasound Guidance Line Placement  Intra-op Plan:   Post-operative Plan: Post-operative intubation/ventilation  Informed Consent: I have reviewed the patients History and Physical, chart, labs and discussed the procedure including the risks, benefits and alternatives for the proposed anesthesia with the patient or authorized representative who has indicated his/her understanding and acceptance.     Dental advisory given  Plan Discussed with: CRNA and Surgeon  Anesthesia Plan Comments:        Anesthesia Quick Evaluation

## 2022-10-31 NOTE — Op Note (Signed)
[name], [name] MEDICAL RECORD NO: [id number] ACCOUNT NO: [id number] DATE OF BIRTH: December 22, 1940 FACILITY: [facility] LOCATION: [facility] PHYSICIAN: Vernon Decent. Dorris Fetch, MD  Operative Report   DATE OF PROCEDURE: 10/31/2022  PREPROCEDURE DIAGNOSIS:  Severe 3-vessel coronary disease, status post non-ST elevation myocardial infarction.  POSTOPERATIVE DIAGNOSIS:  Severe 3-vessel coronary disease, status post non-ST elevation myocardial infarction.  PROCEDURE PERFORMED:   Median sternotomy, extracorporeal circulation,  Coronary artery bypass grafting x5  Left internal mammary artery to LAD,  Saphenous vein graft to acute marginal,  Sequential saphenous vein graft to obtuse marginals 1, 3 and posterolateral Endoscopic vein harvest, right leg.  SURGEON:  Vernon Decent. Dorris Fetch, MD  ASSISTANT:  Vernon Gell, PA.  ANESTHESIA:  General.  FINDINGS:  Transesophageal echocardiography showed a preserved left ventricular function with no significant valvular pathology.  Mammary good quality, vein to acute marginal fair quality, sequential vein good quality.  LAD diffusely diseased poor quality target, acute marginal and posterolateral fair quality targets due to small size, OM 1 and 3 good quality targets.  CLINICAL NOTE:  [name] is an 82 year old gentleman who presented with unstable chest pain.  He ruled in for a non-ST elevation MI.  At cardiac catheterization, he was found to have severe 3-vessel coronary artery disease.  He was advised to undergo coronary artery bypass grafting.  The indications, risks, benefits, and alternatives were discussed in detail with the patient.  He understood and accepted the risks and agreed to proceed.  OPERATIVE NOTE:  [name] was brought to the preoperative holding area on 10/31/2022.  Anesthesia placed a Swan-Ganz catheter and an arterial blood pressure monitoring line.  He was taken to the operating room and anesthetized and intubated.  A Foley catheter  was placed.  Intravenous antibiotics were administered.  Transesophageal echocardiography was performed by Dr. Okey Dupre.  Please see his separately dictated note for full details of the procedure.  There was preserved left ventricular function with no significant valvular pathology.  The chest, abdomen and legs were prepped and draped in the usual sterile fashion.  A timeout was performed.  A median sternotomy was performed and the left internal mammary artery was harvested using standard technique.  Simultaneously, an incision was made in the medial aspect of the right leg just below the knee.  The greater saphenous vein was identified.  It was harvested endoscopically from mid calf to groin. 2000 units of heparin was administered during the vessel harvest.  The remainder of the full heparin dose was given prior to opening the pericardium.  After the vein was harvested from the leg, the leg incisions were closed in standard fashion.  The remainder of the full heparin dose was given.  Sternal retractor was placed and was gradually opened.  The pericardium was opened.  The ascending aorta was inspected.  There was no evidence of atherosclerotic disease.  The aorta was cannulated via concentric 2-0 Ethibond pledgeted pursestring sutures.  A dual stage venous cannula was placed via a pursestring suture in right atrial appendage.  Cardiopulmonary bypass was initiated.  Flows were maintained per protocol.  The patient was cooled to 32 degrees Celsius.  The coronary arteries were inspected and anastomotic sites were chosen.  Due to the size of the vein and the location of the targets, the decision was made to include the posterolateral branch of the right coronary with the sequential graft to  the obtuse marginals and graft the acute marginal separately with a small piece of vein.  The conduits  were inspected and prepared.  A foam pad was placed in the pericardium to insulate the heart.  A temperature probe was   placed in the myocardial septum and a cardioplegia cannula was placed in the ascending aorta.  The aorta was crossclamped.  The left ventricle was emptied via the aortic root vent.  Cardiac arrest then was achieved with a combination of cold antegrade blood cardioplegia and topical iced saline, 1.5 liters of cardioplegia was administered.  There was a rapid diastolic arrest.  Initially, there was relatively slow septal cooling, but ultimately there was septal cooling to 12 degrees Celsius.  A reversed saphenous vein graft was placed end-to-side to the acute marginal branch of the right coronary.  This vessel crossed the acute margin and traversed towards the posterior descending territory and was essentially the posterior descending equivalent.  It accepted a 1.5 mm probe.  It was relatively small, fair quality vessel. The vein was anastomosed end-to-side with a running 7-0 Prolene suture.  All anastomoses were probed proximally and distally at their completion to ensure patency.  Cardioplegia was administered down the graft and there was good flow and good hemostasis.  Additional cardioplegia was also administered down the aortic root.  The heart then was elevated exposing the posterolateral wall.  A reversed saphenous vein graft was placed sequentially to obtuse marginals 1 and 3 and the posterolateral branch of the right coronary.  Side-to-side anastomoses were performed to the OM1 and 3.  OM1 was a 81 mm good quality target.  OM3 was a 2 mm vessel that did have diffuse plaque, but was good quality at site of anastomosis.  Both side-to-side anastomoses were performed  with running 7-0 Prolene sutures.  Again, both anastomoses probed easily.  Then, the distal end of the vein was bevelled and anastomosed end-to-side to the posterolateral branch of the right coronary.  It was a 1.5 mm fair quality target due to small size, but as noted, it did accept a 1.5 mm probe.  At the completion of the anastomoses,  cardioplegia was administered down the graft.  There was excellent flow and good hemostasis at all 3 anastomosis. Additional cardioplegia was also administered down the aortic root.  The left internal mammary artery was brought through a window in the pericardium.  The distal end limb was bevelled.  The mammary was a 1.5 mm good quality conduit.  It was anastomosed end-to-side to the LAD.  The LAD was a 1.5 mm vessel, but was diffusely diseased.  A 1.5 mm probe did pass proximally and a 1 mm probe passed distally, but there was no site in the vessel that was free of significant plaque.  The end-to-side anastomosis was performed with a running 8-0 Prolene suture.  At completion of the anastomosis, the bulldog clamp was removed.  Rapid septal rewarming was noted.  The bulldog clamp was replaced.  Additional cardioplegia was administered.  The cardioplegia cannula then was removed from the ascending aorta.  The vein grafts were cut to length.  The proximal vein graft anastomoses were performed to 4.0 mm punch aortotomies with running 6-0 Prolene sutures.  At the completion of the final proximal anastomosis, the patient was placed in Trendelenburg position.  Lidocaine was administered.  The aortic root was de-aired and the aortic crossclamp was removed.  The total crossclamp time was 90 minutes. The patient required a single defibrillation with 10 joules and then was in a bradycardic rhythm thereafter.  While rewarming was completed, all proximal and distal  anastomoses were inspected for hemostasis.  Epicardial pacing wires were placed on the right ventricle and right atrium and DDD pacing was initiated at 80 beats per minute.  When the patient had rewarmed to a core temperature 37 degrees Celsius, he was weaned from cardiopulmonary bypass on the first attempt without difficulty.  Total bypass time was 120 minutes.  He did not require inotropic support.  Initial cardiac index was greater than 2 liters per minute  per meter squared.  The patient remained hemodynamically stable throughout the post-bypass period.  Post-bypass transesophageal echocardiography was unchanged from the prebypass study.  A test dose of protamine was administered and was well tolerated.  The atrial and aortic cannulae were removed.  The remainder of the protamine was administered without incident.  The chest was irrigated with warm saline.  Hemostasis was achieved.  Left pleural and mediastinal chest tubes were placed through separate subcostal incisions.  The pericardium was reapproximated over the ascending aorta and base of the heart with interrupted 3-0 silk sutures.  The sternum was closed with a combination of single and double heavy gauge stainless steel wires.  Pectoralis fascia, subcutaneous tissue and skin were closed in standard fashion.  All sponge, needle and instrument counts were correct at the end of the procedure.  The patient was taken from the operating room to the surgical intensive care unit, intubated and in fair condition.  Experienced assistance was necessary for this case due to surgical complexity.  Vernon Gell, PA assisted independently harvesting the saphenous vein endoscopically and closing the leg incisions and then assisting with exposure, retraction of delicate tissues, suture management and suctioning during the anastomoses.   PUS D: 10/31/2022 3:35:18 pm T: 10/31/2022 9:10:00 pm  JOB: [id number]/ [id number]

## 2022-11-01 ENCOUNTER — Inpatient Hospital Stay (HOSPITAL_COMMUNITY): Payer: Medicare Other

## 2022-11-01 LAB — GLUCOSE, CAPILLARY
Glucose-Capillary: 122 mg/dL — ABNORMAL HIGH (ref 70–99)
Glucose-Capillary: 124 mg/dL — ABNORMAL HIGH (ref 70–99)
Glucose-Capillary: 125 mg/dL — ABNORMAL HIGH (ref 70–99)
Glucose-Capillary: 129 mg/dL — ABNORMAL HIGH (ref 70–99)
Glucose-Capillary: 131 mg/dL — ABNORMAL HIGH (ref 70–99)
Glucose-Capillary: 132 mg/dL — ABNORMAL HIGH (ref 70–99)
Glucose-Capillary: 138 mg/dL — ABNORMAL HIGH (ref 70–99)
Glucose-Capillary: 156 mg/dL — ABNORMAL HIGH (ref 70–99)

## 2022-11-01 LAB — CBC
HCT: 28.7 % — ABNORMAL LOW (ref 39.0–52.0)
HCT: 28.6 % — ABNORMAL LOW (ref 39.0–52.0)
Hemoglobin: 10.1 g/dL — ABNORMAL LOW (ref 13.0–17.0)
Hemoglobin: 9.6 g/dL — ABNORMAL LOW (ref 13.0–17.0)
MCH: 33 pg (ref 26.0–34.0)
MCH: 32.7 pg (ref 26.0–34.0)
MCHC: 35.2 g/dL (ref 30.0–36.0)
MCHC: 33.6 g/dL (ref 30.0–36.0)
MCV: 93.8 fL (ref 80.0–100.0)
MCV: 97.3 fL (ref 80.0–100.0)
Platelets: 132 10*3/uL — ABNORMAL LOW (ref 150–400)
Platelets: 132 10*3/uL — ABNORMAL LOW (ref 150–400)
RBC: 3.06 MIL/uL — ABNORMAL LOW (ref 4.22–5.81)
RBC: 2.94 MIL/uL — ABNORMAL LOW (ref 4.22–5.81)
RDW: 13.7 % (ref 11.5–15.5)
RDW: 14 % (ref 11.5–15.5)
WBC: 12.3 10*3/uL — ABNORMAL HIGH (ref 4.0–10.5)
WBC: 12 10*3/uL — ABNORMAL HIGH (ref 4.0–10.5)
nRBC: 0 % (ref 0.0–0.2)
nRBC: 0 % (ref 0.0–0.2)

## 2022-11-01 LAB — BASIC METABOLIC PANEL
Anion gap: 9 (ref 5–15)
Anion gap: 9 (ref 5–15)
BUN: 18 mg/dL (ref 8–23)
BUN: 25 mg/dL — ABNORMAL HIGH (ref 8–23)
CO2: 21 mmol/L — ABNORMAL LOW (ref 22–32)
CO2: 21 mmol/L — ABNORMAL LOW (ref 22–32)
Calcium: 7.6 mg/dL — ABNORMAL LOW (ref 8.9–10.3)
Calcium: 7.7 mg/dL — ABNORMAL LOW (ref 8.9–10.3)
Chloride: 105 mmol/L (ref 98–111)
Chloride: 104 mmol/L (ref 98–111)
Creatinine, Ser: 1.39 mg/dL — ABNORMAL HIGH (ref 0.61–1.24)
Creatinine, Ser: 1.89 mg/dL — ABNORMAL HIGH (ref 0.61–1.24)
GFR, Estimated: 51 mL/min — ABNORMAL LOW (ref >60)
GFR, Estimated: 35 mL/min — ABNORMAL LOW (ref >60)
Glucose, Bld: 123 mg/dL — ABNORMAL HIGH (ref 70–99)
Glucose, Bld: 140 mg/dL — ABNORMAL HIGH (ref 70–99)
Potassium: 4.3 mmol/L (ref 3.5–5.1)
Potassium: 4.2 mmol/L (ref 3.5–5.1)
Sodium: 135 mmol/L (ref 135–145)
Sodium: 134 mmol/L — ABNORMAL LOW (ref 135–145)

## 2022-11-01 LAB — MAGNESIUM
Magnesium: 2.7 mg/dL — ABNORMAL HIGH (ref 1.7–2.4)
Magnesium: 2.7 mg/dL — ABNORMAL HIGH (ref 1.7–2.4)

## 2022-11-01 MED ORDER — ENOXAPARIN SODIUM 30 MG/0.3ML IJ SOSY
30.0000 mg | PREFILLED_SYRINGE | Freq: Every day | INTRAMUSCULAR | Status: DC
  Administered 2022-11-01 – 2022-11-04 (×4): 30 mg via SUBCUTANEOUS
  Filled 2022-11-01 (×4): qty 0.3

## 2022-11-01 MED ORDER — INSULIN ASPART 100 UNIT/ML IJ SOLN: 0.0000 [IU] | INTRAMUSCULAR | Status: DC

## 2022-11-01 MED ORDER — INSULIN ASPART 100 UNIT/ML IJ SOLN
0.0000 [IU] | INTRAMUSCULAR | Status: DC
  Administered 2022-11-01 – 2022-11-02 (×8): 2 [IU] via SUBCUTANEOUS

## 2022-11-01 NOTE — Progress Notes (Signed)
[address]       [place] [id number]             [phone]                 1 Day Post-Op Procedure(s) (LRB): CORONARY ARTERY BYPASS GRAFTING (CABG) X 5 USING LEFT INTERNAL MAMMARY ARTERY AND ENDOSCOPICALLY HARVESTED RIGHT GREATER SAPHENOUS VEIN (N/A) TRANSESOPHAGEAL ECHOCARDIOGRAM (N/A)   Events: No events extubated _______________________________________________________________ Vitals: BP 95/61 (BP Location: Left Arm)   Pulse 79   Temp 99 F (37.2 C)   Resp (!) 21   Ht 5\' 9"  (1.753 m)   Wt 117.1 kg   SpO2 92%   BMI 38.12 kg/m  Filed Weights   10/30/22 0353 10/31/22 0506 11/01/22 0500  Weight: 111.4 kg 111.5 kg 117.1 kg     - Neuro: alert NAD  - Cardiovascular: sinus  Drips: neo 6.   PAP: (26-108)/(6-47) 45/21 CO:  [4.7 L/min-6.5 L/min] 5.7 L/min CI:  [2.1 L/min/m2-2.9 L/min/m2] 2.5 L/min/m2  - Pulm: EWOB   ABG    Component Value Date/Time   PHART 7.313 (L) 10/31/2022 2120   PCO2ART 40.1 10/31/2022 2120   PO2ART 92 10/31/2022 2120   HCO3 20.4 10/31/2022 2120   TCO2 22 10/31/2022 2120   ACIDBASEDEF 5.0 (H) 10/31/2022 2120   O2SAT 96 10/31/2022 2120    - Abd: ND - Extremity: warm  .Intake/Output      05/03 0701 05/04 0700 05/04 0701 05/05 0700   P.O.     I.V. (mL/kg) 2972.8 (25.4)    Blood 457    Other 120    IV Piggyback 1208.6    Total Intake(mL/kg) 4758.5 (40.6)    Urine (mL/kg/hr) 2545 (0.9)    Blood 850    Chest Tube 307    Total Output 3702    Net +1056.5            _______________________________________________________________ Labs:    Latest Ref Rng & Units 11/01/2022    3:42 AM 10/31/2022    9:20 PM 10/31/2022    7:16 PM  CBC  WBC 4.0 - 10.5 K/uL 12.3   11.3   Hemoglobin 13.0 - 17.0 g/dL 82.9  56.2  13.0   Hematocrit 39.0 - 52.0 % 28.7  32.0  32.3   Platelets 150 - 400 K/uL 132   131       Latest Ref Rng & Units 11/01/2022    3:42 AM 10/31/2022    9:20 PM 10/31/2022    7:16 PM  CMP  Glucose 70 - 99  mg/dL 865   784   BUN 8 - 23 mg/dL 18   16   Creatinine 6.96 - 1.24 mg/dL 2.95   2.84   Sodium 132 - 145 mmol/L 135  138  135   Potassium 3.5 - 5.1 mmol/L 4.3  4.5  4.4   Chloride 98 - 111 mmol/L 105   106   CO2 22 - 32 mmol/L 21   21   Calcium 8.9 - 10.3 mg/dL 7.6   7.5     CXR: clear  _______________________________________________________________  Assessment and Plan: POD 1 s/p CABG  Neuro: pain controlled CV: wean Neo.  On A/S.  Holding BB.  Will remove swan and A line Pulm: IS, ambulation Renal: creat up slightly.  Giving more fluid GI: on diet Heme: stable ID: [email or web address] Endo: SSI Dispo: continue ICU care   Ommie Degeorge O Jaquise Faux 11/01/2022 8:07 AM

## 2022-11-01 NOTE — Discharge Instructions (Signed)
Discharge Instructions:  1. You may shower, please wash incisions daily with soap and water and keep dry.  If you wish to cover wounds with dressing you may do so but please keep clean and change daily.  No tub baths or swimming until incisions have completely healed.  If your incisions become red or develop any drainage please call our office at [phone]  2. No Driving until cleared by Dr. Hendrickson's office and you are no longer using narcotic pain medications  3. Monitor your weight daily.. Please use the same scale and weigh at same time... If you gain 5-10 lbs in 48 hours with associated lower extremity swelling, please contact our office at [phone]  4. Fever of 101.5 for at least 24 hours with no source, please contact our office at [phone]  5. Activity- up as tolerated, please walk at least 3 times per day.  Avoid strenuous activity, no lifting, pushing, or pulling with your arms over 8-10 lbs for a minimum of 6 weeks  6. If any questions or concerns arise, please do not hesitate to contact our office at [phone]]   ===============================================================================================  Information on my medicine - ELIQUIS (apixaban)  This medication education was reviewed with me or my healthcare representative as part of my discharge preparation.   Why was Eliquis prescribed for you? Eliquis was prescribed for you to reduce the risk of a blood clot forming that can cause a stroke if you have a medical condition called atrial fibrillation (a type of irregular heartbeat).  What do You need to know about Eliquis ? Take your Eliquis TWICE DAILY - one tablet in the morning and one tablet in the evening with or without food. If you have difficulty swallowing the tablet whole please discuss with your pharmacist how to take the medication safely.  Take Eliquis exactly as prescribed by your doctor and DO NOT stop taking Eliquis without  talking to the doctor who prescribed the medication.  Stopping may increase your risk of developing a stroke.  Refill your prescription before you run out.  After discharge, you should have regular check-up appointments with your healthcare provider that is prescribing your Eliquis.  In the future your dose may need to be changed if your kidney function or weight changes by a significant amount or as you get older.  What do you do if you miss a dose? If you miss a dose, take it as soon as you remember on the same day and resume taking twice daily.  Do not take more than one dose of ELIQUIS at the same time to make up a missed dose.  Important Safety Information A possible side effect of Eliquis is bleeding. You should call your healthcare provider right away if you experience any of the following: Bleeding from an injury or your nose that does not stop. Unusual colored urine (red or dark brown) or unusual colored stools (red or black). Unusual bruising for unknown reasons. A serious fall or if you hit your head (even if there is no bleeding).  Some medicines may interact with Eliquis and might increase your risk of bleeding or clotting while on Eliquis. To help avoid this, consult your healthcare provider or pharmacist prior to using any new prescription or non-prescription medications, including herbals, vitamins, non-steroidal anti-inflammatory drugs (NSAIDs) and supplements.  This website has more information on Eliquis (apixaban): http://[email or web address].com/eliquis/home   

## 2022-11-02 ENCOUNTER — Inpatient Hospital Stay (HOSPITAL_COMMUNITY): Payer: Medicare Other

## 2022-11-02 LAB — GLUCOSE, CAPILLARY
Glucose-Capillary: 100 mg/dL — ABNORMAL HIGH (ref 70–99)
Glucose-Capillary: 117 mg/dL — ABNORMAL HIGH (ref 70–99)
Glucose-Capillary: 121 mg/dL — ABNORMAL HIGH (ref 70–99)
Glucose-Capillary: 124 mg/dL — ABNORMAL HIGH (ref 70–99)
Glucose-Capillary: 140 mg/dL — ABNORMAL HIGH (ref 70–99)
Glucose-Capillary: 141 mg/dL — ABNORMAL HIGH (ref 70–99)

## 2022-11-02 LAB — BASIC METABOLIC PANEL
Anion gap: 5 (ref 5–15)
BUN: 29 mg/dL — ABNORMAL HIGH (ref 8–23)
CO2: 22 mmol/L (ref 22–32)
Calcium: 7.6 mg/dL — ABNORMAL LOW (ref 8.9–10.3)
Chloride: 106 mmol/L (ref 98–111)
Creatinine, Ser: 1.85 mg/dL — ABNORMAL HIGH (ref 0.61–1.24)
GFR, Estimated: 36 mL/min — ABNORMAL LOW (ref >60)
Glucose, Bld: 117 mg/dL — ABNORMAL HIGH (ref 70–99)
Potassium: 3.8 mmol/L (ref 3.5–5.1)
Sodium: 133 mmol/L — ABNORMAL LOW (ref 135–145)

## 2022-11-02 LAB — CBC
HCT: 28.1 % — ABNORMAL LOW (ref 39.0–52.0)
Hemoglobin: 9.5 g/dL — ABNORMAL LOW (ref 13.0–17.0)
MCH: 33.1 pg (ref 26.0–34.0)
MCHC: 33.8 g/dL (ref 30.0–36.0)
MCV: 97.9 fL (ref 80.0–100.0)
Platelets: 130 10*3/uL — ABNORMAL LOW (ref 150–400)
RBC: 2.87 MIL/uL — ABNORMAL LOW (ref 4.22–5.81)
RDW: 14.1 % (ref 11.5–15.5)
WBC: 11.6 10*3/uL — ABNORMAL HIGH (ref 4.0–10.5)
nRBC: 0 % (ref 0.0–0.2)

## 2022-11-02 NOTE — Discharge Summary (Signed)
[address]       [place] [address]             [phone]    Physician Discharge Summary  Patient ID: [name] MRN: [id number] DOB/AGE: 81-Jul-1942 81 y.o.  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 [name] is an 82 year old gentleman with a past history notable for hypertension, dyslipidemia, prostate cancer status post cryoablation at [facility] in 2013 and who is a former smoker.  He also has a history of surgery for resection of a brain tumor at [facility] about 40 years ago.  He presented to his PCP, Dr. Lynnea Ferrier of [facility], on 10/24/2022 for evaluation of skin rash to his chest.  During that visit he also mentioned that he had been having exertional chest discomfort off and on for several days.  An EKG was done in the office showing nonspecific ST depressions in aVL.  He was given a prescription for nitroglycerin and a referral to the ambulatory cardiology clinic.  He returned to his PCPs office 3 days later again reporting active chest pain and was advised to go to the emergency room.  He presented to the [facility] health ED at [facility] on 4/29.  Initial EKG showed septal infarct, age undetermined.  Initial high-sensitivity troponin was elevated at 84 with serial studies over  the next several hours at [id number].  Chest x-ray showed no active cardiopulmonary disease.  Having ruled in for acute non-ST elevation myocardial infarction, the patient's treatment plan was discussed with Dr. Mayford Knife of the cardiology service.  She recommended hospital admission and initiation of heparin infusion.  Mr. [name] was transferred to [facility].  He had no further chest pain following admission.  A coronary CT was obtained on the day following admission demonstrating a high probability of obstructive disease in the distributions of the LAD, proximal circumflex, proximal OM1.  Echocardiogram was obtained that same day showing normal biventricular function.  The mitral valve and aortic valve showed no significant structural or functional abnormalities.  He was seen by Dr. Antoine Poche of the cardiology service and left heart catheterization recommended.  That study was performed yesterday confirming high-grade proximal stenoses in the LAD and circumflex coronary arteries along with an 85% stenosis of the proximal the RCA had a 70% stenosis near the ostium and there was a 90% stenosis in the PL just before the vessel trifurcates. Mr. [name] has had no further chest pain since admission while on the heparin infusion.  CT surgery has been consulted for consideration of coronary bypass grafting for his severe multivessel coronary artery disease.   Mr. [name] is alert and comfortable resting in bed with heparin infusing. 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 He lives with one of his sons and also has two other sons in the area who would be willing to assist with his recovery.    In regard to the brain surgery; he said he began having syncopal episodes while working about 40 years ago.  Work-up revealed " a brain tumor" that was resected at [facility]. Mr. [name] does not know the tumor type and other than having occasional  episodes of dizziness, he has had no neurologic problems since the surgery.   Dr. Dorris Fetch reviewed the patient's diagnostic studies and determined he would benefit from surgical intervention. He reviewed the patient's treatment options as well as the risks and benefits of surgery. Mr. [name] was agreeable to proceed with surgery.   Hospital Course: Mr. [name] was admitted to [facility] on 10/27/22 and remained stable until he was brought to the operating room on 81/03/24. He underwent CABG x 5 utilizing LIMA to LAD, SVG sequential to OM1, OM2, and OM3, and SVG to Acute Marginal as well as endoscopic harvest of the left greater saphenous vein. He tolerated the procedure well and was transferred to the SICU in stable condition.  He was extubated the evening of surgery.  He was hypotensive requiring support with Neo-synephrine.  This was weaned as hemodynamics allowed.  He developed AKI and was treated with IV hydration.  He underwent removal of Swan and arterial line. The patients chest tubes and arterial lines were removed without difficulty.  His creatinine level remained stable. His epicardial pacing wires were removed without difficulty. He had an NSTEMI, Plavix would be started prior to discharge. He was volume overload and diuresed accordingly. He was felt stable for transfer to the progressive unit. Diuresis was continued and renal function remained stable. He progressed to independent mobility.  Mr. [name] had recurrence of the atrial fibrillation with VR in the low 100's. When in SR, he tended to be bradycardic.  The cardiology service was consulted and recommended stopping the metoprolol and continuing the amiodarone. Eliquis was also added for the PAF.  By the 5th post-op day, Mr. [name] had been weaned to room air. The incisions were healing with no sign of complication. He was down to his near his pre-op weight.   Consults: cardiology  Significant Diagnostic Studies:   LEFT  HEART CATH AND CORONARY ANGIOGRAPHY   Conclusion    Ost LAD lesion is 70% stenosed with 70% stenosed side branch in Ost Cx to Prox Cx.   Mid LAD lesion is 80% stenosed.   Prox Cx to Mid Cx lesion is 70% stenosed with 85% stenosed side branch in 1st Mrg.  Mid Cx lesion is 85% stenosed.   Ost RCA lesion is 70% stenosed.  RPAV lesion is 90% stenosed.   LV end diastolic pressure is normal.   There is no aortic valve stenosis.   POST-CATH DIAGNOSES SEVERE MULTIVESSEL DISEASE: Not amenable to PCI Ostial RCA at least 70% stenosis with mild diffuse disease elsewhere but 90% stenosis branch point in the RPA V (very high bifurcation into PAV and PDA) Separate ostia for LAD and LCx: Ostial LAD 70%, mid LAD 80% Ostial and proximal LCx 70% -> continues beyond OM1 followed by 85% stenosis in the mid AVG prior to OM 2; OM 1 has proximal 85% stenosis Normal LVEDP.   ECHOCARDIOGRAM REPORT    Patient Name:   JAYMAR SCARPACI Date of Exam: 10/28/2022  Medical Rec #:  [id number]  Height:       69.0 in  Accession #:    [id number]       Weight:       245.1 lb  Date of Birth:  1940/10/24        BSA:          2.253 m  Patient Age:    81 years         BP:           126/70 mmHg  Patient Gender: M                HR:           49 bpm.  Exam Location:  Inpatient   Procedure: 2D Echo, Cardiac Doppler and Color Doppler   Indications:    Chest Pain R07.9    History:        Patient has no prior history of Echocardiogram  examinations.                 Angina, Signs/Symptoms:Chest Pain; Risk Factors:Former  Smoker                 and Dyslipidemia.    Sonographer:    Aron Baba  Referring Phys: [address] IN[place]     Sonographer Comments: Suboptimal subcostal window. Image acquisition  challenging due to patient body habitus and Image acquisition challenging  due to respiratory motion.   IMPRESSIONS    1. Left ventricular ejection fraction, by estimation, is 60 to 65%. The  left ventricle has  normal function. The left ventricle has no regional  wall motion abnormalities. There is mild concentric left ventricular  hypertrophy. Left ventricular diastolic  parameters are consistent with Grade II diastolic dysfunction  (pseudonormalization).   2. Right ventricular systolic function is normal. The right ventricular  size is normal. Tricuspid regurgitation signal is inadequate for assessing  PA pressure.   3. Left atrial size was mildly dilated.   4. The mitral valve is grossly normal. No evidence of mitral valve  regurgitation.   5. The aortic valve was not well visualized. There is mild calcification  of the aortic valve. Aortic valve regurgitation is not visualized. No  aortic stenosis is present.   Comparison(s): No prior Echocardiogram.   FINDINGS   Left Ventricle: Left ventricular ejection fraction, by estimation, is 60  to 65%. The left ventricle has normal function. The left ventricle has no  regional wall motion abnormalities. The left ventricular internal cavity  size was normal in size. There is   mild concentric left ventricular hypertrophy. Left ventricular diastolic  parameters are consistent with Grade II diastolic dysfunction  (pseudonormalization).   Right Ventricle: The right ventricular size is normal. No increase in  right ventricular wall thickness. Right ventricular systolic function is  normal. Tricuspid regurgitation signal is inadequate for assessing PA  pressure.   Left Atrium: Left atrial size was mildly dilated.   Right Atrium: Right atrial size was normal in size.   Pericardium: There is no evidence of pericardial effusion.   Mitral Valve: The mitral valve is grossly normal. No evidence of mitral  valve regurgitation.   Tricuspid Valve: The tricuspid valve is normal in structure. Tricuspid  valve regurgitation is not demonstrated. No evidence of tricuspid  stenosis.   Aortic Valve: The aortic valve was not well visualized. There is mild   calcification of the aortic valve. Aortic valve regurgitation is not  visualized. No aortic stenosis is present.  Pulmonic Valve: The pulmonic valve was normal in structure. Pulmonic valve  regurgitation is not visualized. No evidence of pulmonic stenosis.   Aorta: The aortic root and ascending aorta are structurally normal, with  no evidence of dilitation.   IAS/Shunts: No atrial level shunt detected by color flow Doppler.   LEFT VENTRICLE  PLAX 2D  LVIDd:         5.30 cm   Diastology  LVIDs:         3.60 cm   LV e' medial:    5.27 cm/s  LV PW:         1.10 cm   LV E/e' medial:  16.8  LV IVS:        1.10 cm   LV e' lateral:   4.78 cm/s  LVOT diam:     2.10 cm   LV E/e' lateral: 18.5  LV SV:         99  LV SV Index:   44  LVOT Area:     3.46 cm     RIGHT VENTRICLE  RV S prime:     9.65 cm/s  TAPSE (M-mode): 1.7 cm   LEFT ATRIUM             Index        RIGHT ATRIUM           Index  LA diam:        4.90 cm 2.18 cm/m   RA Area:     15.10 cm  LA Vol (A2C):   86.3 ml 38.31 ml/m  RA Volume:   34.30 ml  15.23 ml/m  LA Vol (A4C):   63.3 ml 28.10 ml/m  LA Biplane Vol: 78.4 ml 34.80 ml/m   AORTIC VALVE             PULMONIC VALVE  LVOT Vmax:   120.00 cm/s PR End Diast Vel: 1.52 msec  LVOT Vmean:  80.300 cm/s  LVOT VTI:    0.287 m    AORTA  Ao Root diam: 3.70 cm  Ao Asc diam:  3.70 cm   MITRAL VALVE  MV Area (PHT): 3.89 cm    SHUNTS  MV Decel Time: 195 msec    Systemic VTI:  0.29 m  MV E velocity: 88.30 cm/s  Systemic Diam: 2.10 cm  MV A velocity: 70.30 cm/s  MV E/A ratio:  1.26   Riley Lam MD  Electronically signed by Riley Lam MD  Signature Date/Time: 10/28/2022/4:53:10 PM      Final     Treatments: surgery:  Operative Report    DATE OF PROCEDURE: 10/31/2022   PREPROCEDURE DIAGNOSIS:  Severe 3-vessel coronary disease, status post non-ST elevation myocardial infarction.   POSTOPERATIVE DIAGNOSIS:  Severe 3-vessel coronary disease,  status post non-ST elevation myocardial infarction.   PROCEDURE PERFORMED:  Median sternotomy, extracorporeal circulation, coronary artery bypass grafting x5 (left internal mammary artery to LAD, saphenous vein graft to acute marginal, sequential saphenous vein graft to obtuse marginals 1, 3 and  posterolateral), endoscopic vein harvest, right leg.   Discharge Exam: Blood pressure (!) 147/67, pulse 66, temperature 97.8 F (36.6 C), temperature source Oral, resp. rate 20, height 5\' 9"  (1.753 m), weight 112.1 kg, SpO2 99 %.  General appearance: alert, cooperative, and no distress Neurologic: intact Heart:  SR in the 60's, no further atrial fibrillation Lungs: breath sounds are clear but shallow. O2 sat  99% on RA Abdomen: soft, no tenderness.  Wound: the sternotomy incision is  intact and dry, mild erythema along edges. Expected RLE bruising along EVH tunnel.  EXTS: LE edema is slowly resolving   Discharge Medications:  The patient has been discharged on:   1.Beta Blocker:  Yes [ X  ]                              No   [   ]                              If No, reason:  2.Ace Inhibitor/ARB: Yes [   ]                                     No  [ x   ]                                     If No, reason: AKI  3.Statin:   Yes [ X  ]                  No  [   ]                  If No, reason:  4.Ecasa:  Yes  [  X ]                  No   [   ]                  If No, reason:  Patient had ACS upon admission: Yes  Plavix/P2Y12 inhibitor: Yes [   ]                                      No  [ x  ]  On Eliquis     Discharge Instructions     AMB referral to Phase II Cardiac Rehabilitation   Complete by: As directed    Diagnosis:  CABG Stable Angina     CABG X ___: 5 Comment - unknown   After initial evaluation and assessments completed: Virtual Based Care may be provided alone or in conjunction with Phase 2 Cardiac Rehab based on patient barriers.: Yes   Intensive Cardiac  Rehabilitation (ICR) [facility] location only OR Traditional Cardiac Rehabilitation (TCR) *If criteria for ICR are not met will enroll in TCR [facility] only): Yes      Allergies as of 11/06/2022       Reactions   Norvasc [amlodipine] Swelling   Leg swelling   Red Dye Hives        Medication List     STOP taking these medications    losartan 100 MG tablet Commonly known as: COZAAR   triamcinolone cream 0.1 % Commonly known as: KENALOG       TAKE these medications    amiodarone 200 MG tablet Commonly known as: PACERONE Take 2 tablets (400 mg total) by mouth 2 (two) times daily. For 3 days then decrease the dose to 200mg   (1 tablet) twice daily for 7 days then decrease the dose to 200mg  ONCE daily   apixaban 2.5 MG Tabs tablet Commonly known as:  ELIQUIS Take 1 tablet (2.5 mg total) by mouth 2 (two) times daily.   aspirin EC 81 MG tablet Take 81 mg by mouth at bedtime.   chlorpheniramine-HYDROcodone 10-8 MG/5ML Commonly known as: TUSSIONEX Take 5 mLs by mouth at bedtime as needed for cough.   furosemide 40 MG tablet Commonly known as: Lasix Take 1 tablet (40 mg total) by mouth daily.   furosemide 40 MG tablet Commonly known as: LASIX Take 1 tablet (40 mg total) by mouth daily.   guaiFENesin 600 MG 12 hr tablet Commonly known as: MUCINEX Take 2 tablets (1,200 mg total) by mouth 2 (two) times daily.   nitroGLYCERIN 0.4 MG SL tablet Commonly known as: NITROSTAT Place 1 tablet (0.4 mg total) under the tongue every 5 (five) minutes as needed for chest pain.   oxyCODONE 5 MG immediate release tablet Commonly known as: Oxy IR/ROXICODONE Take 1 tablet (5 mg total) by mouth every 6 (six) hours as needed for up to 5 days for severe pain.   potassium chloride SA 20 MEQ tablet Commonly known as: KLOR-CON M Take 1 tablet (20 mEq total) by mouth daily.   rosuvastatin 40 MG tablet Commonly known as: CRESTOR Take 1 tablet (40 mg total) by mouth daily. What changed:  medication  strength how much to take        Follow-up Information     [name], MD Follow up on 12/02/2022.   Specialty: Cardiothoracic Surgery Why: Follow up appointment is at 10:30AM Contact information: [address] [employer] [address] [place] [address] [phone]         [facility] Follow up on 12/02/2022.   Why: To get chest xray at 9:30AM Contact information: [address] [place] [address]        [place], [place]. Go on 11/20/2022.   Specialty: Internal Medicine Why: Your appointment is at 10am. Contact information: [address] [place] [address] [place] [address] [phone]         [facility] . Go to.   Specialty: Cardiothoracic Surgery Why: The officae will contact you with the appointment time and date. Contact information: [address] [place], [address] [place] [address] [phone]                Signed:  Leary Roca, PA-C  11/06/2022, 8:31 AM

## 2022-11-02 NOTE — Progress Notes (Signed)
      [address]       [place] [address]             [phone]                 2 Days Post-Op Procedure(s) (LRB): CORONARY ARTERY BYPASS GRAFTING (CABG) X 5 USING LEFT INTERNAL MAMMARY ARTERY AND ENDOSCOPICALLY HARVESTED RIGHT GREATER SAPHENOUS VEIN (N/A) TRANSESOPHAGEAL ECHOCARDIOGRAM (N/A)   Events: No events  _______________________________________________________________ Vitals: BP (!) 126/53   Pulse 66   Temp 98.3 F (36.8 C) (Oral)   Resp 20   Ht 5\' 9"  (1.753 m)   Wt 117.4 kg   SpO2 94%   BMI 38.22 kg/m  Filed Weights   10/31/22 0506 11/01/22 0500 11/02/22 0500  Weight: 111.5 kg 117.1 kg 117.4 kg     - Neuro: alert NAD  - Cardiovascular: sinus  Drips: none    - Pulm: EWOB   ABG    Component Value Date/Time   PHART 7.313 (L) 10/31/2022 2120   PCO2ART 40.1 10/31/2022 2120   PO2ART 92 10/31/2022 2120   HCO3 20.4 10/31/2022 2120   TCO2 22 10/31/2022 2120   ACIDBASEDEF 5.0 (H) 10/31/2022 2120   O2SAT 96 10/31/2022 2120    - Abd: ND - Extremity: warm  .Intake/Output      05/04 0701 05/05 0700 05/05 0701 05/06 0700   I.V. (mL/kg) 147.9 (1.3) 20 (0.2)   Blood     Other     IV Piggyback 550    Total Intake(mL/kg) 697.9 (5.9) 20 (0.2)   Urine (mL/kg/hr) 495 (0.2) 65 (0.2)   Blood     Chest Tube 200    Total Output 695 65   Net +2.9 -45           _______________________________________________________________ Labs:    Latest Ref Rng & Units 11/02/2022    4:08 AM 11/01/2022    4:14 PM 11/01/2022    3:42 AM  CBC  WBC 4.0 - 10.5 K/uL 11.6  12.0  12.3   Hemoglobin 13.0 - 17.0 g/dL 9.5  9.6  91.4   Hematocrit 39.0 - 52.0 % 28.1  28.6  28.7   Platelets 150 - 400 K/uL 130  132  132       Latest Ref Rng & Units 11/02/2022    4:08 AM 11/01/2022    4:14 PM 11/01/2022    3:42 AM  CMP  Glucose 70 - 99 mg/dL 782  956  213   BUN 8 - 23 mg/dL 29  25  18    Creatinine 0.61 - 1.24 mg/dL 0.86  5.78  4.69   Sodium 135 - 145 mmol/L 133   134  135   Potassium 3.5 - 5.1 mmol/L 3.8  4.2  4.3   Chloride 98 - 111 mmol/L 106  104  105   CO2 22 - 32 mmol/L 22  21  21    Calcium 8.9 - 10.3 mg/dL 7.6  7.7  7.6     CXR: clear  _______________________________________________________________  Assessment and Plan: POD 2 s/p CABG  Neuro: pain controlled CV: wean Neo.  On A/S/BB.   Pulm: IS, ambulation Renal: creat up, but trending down GI: on diet Heme: stable ID: [email or web address] Endo: SSI Dispo: will watch in unit for creat   Darchelle Nunes O Byard Carranza 11/02/2022 10:11 AM

## 2022-11-02 NOTE — Progress Notes (Signed)
EVENING ROUNDS NOTE :     [address]       [place] [address]             [phone]                 2 Days Post-Op Procedure(s) (LRB): CORONARY ARTERY BYPASS GRAFTING (CABG) X 5 USING LEFT INTERNAL MAMMARY ARTERY AND ENDOSCOPICALLY HARVESTED RIGHT GREATER SAPHENOUS VEIN (N/A) TRANSESOPHAGEAL ECHOCARDIOGRAM (N/A)   Total Length of Stay:  LOS: 6 days  Events:   No events Floor if creat stable    BP 119/65   Pulse 64   Temp 98 F (36.7 C) (Oral)   Resp (!) 23   Ht 5\' 9"  (1.753 m)   Wt 117.4 kg   SpO2 98%   BMI 38.22 kg/m          sodium chloride     lactated ringers 20 mL/hr at 11/01/22 0400    I/O last 3 completed shifts: In: 817.8 [I.V.:167.9; IV Piggyback:649.9] Out: 1015 [Urine:765; Chest Tube:250]      Latest Ref Rng & Units 11/02/2022    4:08 AM 11/01/2022    4:14 PM 11/01/2022    3:42 AM  CBC  WBC 4.0 - 10.5 K/uL 11.6  12.0  12.3   Hemoglobin 13.0 - 17.0 g/dL 9.5  9.6  13.0   Hematocrit 39.0 - 52.0 % 28.1  28.6  28.7   Platelets 150 - 400 K/uL 130  132  132        Latest Ref Rng & Units 11/02/2022    4:08 AM 11/01/2022    4:14 PM 11/01/2022    3:42 AM  BMP  Glucose 70 - 99 mg/dL 865  784  696   BUN 8 - 23 mg/dL 29  25  18    Creatinine 0.61 - 1.24 mg/dL 2.95  2.84  1.32   Sodium 135 - 145 mmol/L 133  134  135   Potassium 3.5 - 5.1 mmol/L 3.8  4.2  4.3   Chloride 98 - 111 mmol/L 106  104  105   CO2 22 - 32 mmol/L 22  21  21    Calcium 8.9 - 10.3 mg/dL 7.6  7.7  7.6     ABG    Component Value Date/Time   PHART 7.313 (L) 10/31/2022 2120   PCO2ART 40.1 10/31/2022 2120   PO2ART 92 10/31/2022 2120   HCO3 20.4 10/31/2022 2120   TCO2 22 10/31/2022 2120   ACIDBASEDEF 5.0 (H) 10/31/2022 2120   O2SAT 96 10/31/2022 2120       Brynda Greathouse, MD 11/02/2022 9:01 PM

## 2022-11-02 NOTE — Progress Notes (Signed)
MD order received to remove pacing wires. INR less than 2. Patient was educated and agreed to removal. Pacing wires intact upon removal and vitals stable.

## 2022-11-03 ENCOUNTER — Encounter (HOSPITAL_COMMUNITY): Payer: Self-pay | Admitting: Thoracic Surgery (Cardiothoracic Vascular Surgery)

## 2022-11-03 LAB — BASIC METABOLIC PANEL
Anion gap: 8 (ref 5–15)
BUN: 28 mg/dL — ABNORMAL HIGH (ref 8–23)
CO2: 22 mmol/L (ref 22–32)
Calcium: 7.8 mg/dL — ABNORMAL LOW (ref 8.9–10.3)
Chloride: 105 mmol/L (ref 98–111)
Creatinine, Ser: 1.66 mg/dL — ABNORMAL HIGH (ref 0.61–1.24)
GFR, Estimated: 41 mL/min — ABNORMAL LOW (ref >60)
Glucose, Bld: 108 mg/dL — ABNORMAL HIGH (ref 70–99)
Potassium: 3.8 mmol/L (ref 3.5–5.1)
Sodium: 135 mmol/L (ref 135–145)

## 2022-11-03 LAB — GLUCOSE, CAPILLARY
Glucose-Capillary: 103 mg/dL — ABNORMAL HIGH (ref 70–99)
Glucose-Capillary: 103 mg/dL — ABNORMAL HIGH (ref 70–99)
Glucose-Capillary: 129 mg/dL — ABNORMAL HIGH (ref 70–99)
Glucose-Capillary: 107 mg/dL — ABNORMAL HIGH (ref 70–99)
Glucose-Capillary: 118 mg/dL — ABNORMAL HIGH (ref 70–99)

## 2022-11-03 LAB — TYPE AND SCREEN
ABO/RH(D): A POS
Unit division: 0
Unit division: 0
Unit division: 0

## 2022-11-03 LAB — BPAM RBC
Blood Product Expiration Date: 202405242359
Blood Product Expiration Date: 202405242359
ISSUE DATE / TIME: 202405030715
ISSUE DATE / TIME: 202405030715
Unit Type and Rh: 6200
Unit Type and Rh: 6200

## 2022-11-03 LAB — ECHO INTRAOPERATIVE TEE
Height: 69 in
Weight: 3933.01 oz

## 2022-11-03 MED ORDER — SODIUM CHLORIDE 0.9% FLUSH: 3.0000 mL | INTRAVENOUS | Status: DC | PRN

## 2022-11-03 MED ORDER — FUROSEMIDE 40 MG PO TABS
40.0000 mg | ORAL_TABLET | Freq: Every day | ORAL | Status: DC
  Administered 2022-11-03: 40 mg via ORAL
  Filled 2022-11-03: qty 1

## 2022-11-03 MED ORDER — ALUM & MAG HYDROXIDE-SIMETH 200-200-20 MG/5ML PO SUSP: 15.0000 mL | Freq: Four times a day (QID) | ORAL | Status: DC | PRN

## 2022-11-03 MED ORDER — DIPHENHYDRAMINE HCL 25 MG PO CAPS
25.0000 mg | ORAL_CAPSULE | Freq: Four times a day (QID) | ORAL | Status: DC | PRN
  Administered 2022-11-04 (×2): 25 mg via ORAL
  Filled 2022-11-03 (×2): qty 1

## 2022-11-03 MED ORDER — MAGNESIUM HYDROXIDE 400 MG/5ML PO SUSP: 30.0000 mL | Freq: Every day | ORAL | Status: DC | PRN

## 2022-11-03 MED ORDER — POTASSIUM CHLORIDE CRYS ER 20 MEQ PO TBCR: 20.0000 meq | EXTENDED_RELEASE_TABLET | ORAL | Status: DC

## 2022-11-03 MED ORDER — GUAIFENESIN ER 600 MG PO TB12
1200.0000 mg | ORAL_TABLET | Freq: Two times a day (BID) | ORAL | Status: DC
  Administered 2022-11-03 – 2022-11-06 (×7): 1200 mg via ORAL
  Filled 2022-11-03 (×7): qty 2

## 2022-11-03 MED ORDER — AMIODARONE HCL 200 MG PO TABS
400.0000 mg | ORAL_TABLET | Freq: Two times a day (BID) | ORAL | Status: DC
  Administered 2022-11-03 – 2022-11-05 (×4): 400 mg via ORAL
  Filled 2022-11-03 (×4): qty 2

## 2022-11-03 MED ORDER — LACTULOSE 10 GM/15ML PO SOLN
20.0000 g | Freq: Once | ORAL | Status: AC
  Administered 2022-11-03: 20 g via ORAL
  Filled 2022-11-03: qty 30

## 2022-11-03 MED ORDER — SODIUM CHLORIDE 0.9 % IV SOLN: 250.0000 mL | INTRAVENOUS | Status: DC | PRN

## 2022-11-03 MED ORDER — BENZONATATE 100 MG PO CAPS
100.0000 mg | ORAL_CAPSULE | Freq: Three times a day (TID) | ORAL | Status: DC | PRN
  Administered 2022-11-04 – 2022-11-06 (×3): 100 mg via ORAL
  Filled 2022-11-03 (×3): qty 1

## 2022-11-03 MED ORDER — ~~LOC~~ CARDIAC SURGERY, PATIENT & FAMILY EDUCATION: Freq: Once | Status: AC

## 2022-11-03 MED ORDER — SODIUM CHLORIDE 0.9% FLUSH
3.0000 mL | Freq: Two times a day (BID) | INTRAVENOUS | Status: DC
  Administered 2022-11-03 – 2022-11-05 (×6): 3 mL via INTRAVENOUS

## 2022-11-03 MED ORDER — INSULIN ASPART 100 UNIT/ML IJ SOLN
0.0000 [IU] | Freq: Three times a day (TID) | INTRAMUSCULAR | Status: DC
  Administered 2022-11-03 – 2022-11-04 (×2): 2 [IU] via SUBCUTANEOUS

## 2022-11-03 MED ORDER — POTASSIUM CHLORIDE CRYS ER 20 MEQ PO TBCR
20.0000 meq | EXTENDED_RELEASE_TABLET | Freq: Every day | ORAL | Status: DC
  Administered 2022-11-03: 20 meq via ORAL
  Filled 2022-11-03: qty 1

## 2022-11-03 NOTE — Progress Notes (Signed)
CARDIAC REHAB PHASE I   PRE:  Rate/Rhythm: 80 afib    BP: sitting 127/67    SpO2: 98 4L, 96 2L  MODE:  Ambulation: 470 ft   POST:  Rate/Rhythm: 108 afib    BP: sitting 118/84     SpO2: 96 2L, 95 1L  Pt up in recliner. Voices that he did not follow sternal precautions in the last few days and now worries about his sternum. Pt able to stand with min assist  and verbal cues. Ambulated on 2L, with RW. Congested, stopped to cough x2. Some wheeze while walking but wheezing significantly worse in recliner, expiratory. Pt unable to converse due to wheeze. Encouraged flutter, which he performed well. Asked RN to access. Left on 1L, encouraged IS, flutter, recliner, and walking. Pt is agreeable. Gave [facility] booklet, pt appreciative.  [phone]   Ethelda Chick BS, ACSM-CEP 11/03/2022 1:50 PM

## 2022-11-03 NOTE — Progress Notes (Signed)
3 Days Post-Op Procedure(s) (LRB): CORONARY ARTERY BYPASS GRAFTING (CABG) X 5 USING LEFT INTERNAL MAMMARY ARTERY AND ENDOSCOPICALLY HARVESTED RIGHT GREATER SAPHENOUS VEIN (N/A) TRANSESOPHAGEAL ECHOCARDIOGRAM (N/A) Subjective: C/o constipation, phlegm  Objective: Vital signs in last 24 hours: Temp:  [97.8 F (36.6 C)-98.4 F (36.9 C)] 97.8 F (36.6 C) (05/06 0400) Pulse Rate:  [53-78] 61 (05/06 0400) Cardiac Rhythm: Normal sinus rhythm (05/05 2000) Resp:  [12-26] 24 (05/06 0400) BP: (101-137)/(44-69) 137/69 (05/06 0400) SpO2:  [93 %-100 %] 99 % (05/06 0400) Weight:  [981 kg] 117 kg (05/06 0247)  Hemodynamic parameters for last 24 hours:    Intake/Output from previous day: 05/05 0701 - 05/06 0700 In: 122.9 [I.V.:23; IV Piggyback:99.9] Out: 820 [Urine:770; Chest Tube:50] Intake/Output this shift: No intake/output data recorded.  General appearance: alert, cooperative, and no distress Neurologic: intact Heart: regular rate and rhythm Lungs: diminished breath sounds bibasilar and L>R Abdomen: mildly distended, + BS, nontender  Lab Results: Recent Labs    11/01/22 1614 11/02/22 0408  WBC 12.0* 11.6*  HGB 9.6* 9.5*  HCT 28.6* 28.1*  PLT 132* 130*   BMET:  Recent Labs    11/02/22 0408 11/03/22 0237  NA 133* 135  K 3.8 3.8  CL 106 105  CO2 22 22  GLUCOSE 117* 108*  BUN 29* 28*  CREATININE 1.85* 1.66*  CALCIUM 7.6* 7.8*    PT/INR:  Recent Labs    10/31/22 1347  LABPROT 16.7*  INR 1.4*   ABG    Component Value Date/Time   PHART 7.313 (L) 10/31/2022 2120   HCO3 20.4 10/31/2022 2120   TCO2 22 10/31/2022 2120   ACIDBASEDEF 5.0 (H) 10/31/2022 2120   O2SAT 96 10/31/2022 2120   CBG (last 3)  Recent Labs    11/02/22 2331 11/03/22 0453 11/03/22 0705  GLUCAP 100* 103* 103*    Assessment/Plan: S/P Procedure(s) (LRB): CORONARY ARTERY BYPASS GRAFTING (CABG) X 5 USING LEFT INTERNAL MAMMARY ARTERY AND ENDOSCOPICALLY HARVESTED RIGHT GREATER SAPHENOUS VEIN  (N/A) TRANSESOPHAGEAL ECHOCARDIOGRAM (N/A) Plan for transfer to step-down: see transfer orders NEURO- intact CV- in SR  ASA, metoprolol, Statin  Start Plavix prior to dc  Dc pacing wires RESP- IS for atelectasis  Will add mucinex and Tessalon RENAL- creatinine improved to 1.66  Fluid overload- diurese with PO Lasix GI- tolerating Po but no BM  Lactulose this AM ENDO- CBG well controlled  Change SSI to Ac and HS Anemia- mild, stable SCD + enoxaparin Transfer to 4E   LOS: 7 days    [name] 11/03/2022

## 2022-11-04 ENCOUNTER — Inpatient Hospital Stay (HOSPITAL_COMMUNITY): Payer: Medicare Other

## 2022-11-04 LAB — CBC
HCT: 28 % — ABNORMAL LOW (ref 39.0–52.0)
Hemoglobin: 10 g/dL — ABNORMAL LOW (ref 13.0–17.0)
MCH: 33.2 pg (ref 26.0–34.0)
MCHC: 35.7 g/dL (ref 30.0–36.0)
MCV: 93 fL (ref 80.0–100.0)
Platelets: 198 10*3/uL (ref 150–400)
RBC: 3.01 MIL/uL — ABNORMAL LOW (ref 4.22–5.81)
RDW: 13.8 % (ref 11.5–15.5)
WBC: 10.5 10*3/uL (ref 4.0–10.5)
nRBC: 0 % (ref 0.0–0.2)

## 2022-11-04 LAB — GLUCOSE, CAPILLARY
Glucose-Capillary: 100 mg/dL — ABNORMAL HIGH (ref 70–99)
Glucose-Capillary: 132 mg/dL — ABNORMAL HIGH (ref 70–99)
Glucose-Capillary: 90 mg/dL (ref 70–99)
Glucose-Capillary: 125 mg/dL — ABNORMAL HIGH (ref 70–99)

## 2022-11-04 LAB — BASIC METABOLIC PANEL
Anion gap: 6 (ref 5–15)
BUN: 32 mg/dL — ABNORMAL HIGH (ref 8–23)
CO2: 23 mmol/L (ref 22–32)
Calcium: 8 mg/dL — ABNORMAL LOW (ref 8.9–10.3)
Chloride: 104 mmol/L (ref 98–111)
Creatinine, Ser: 1.9 mg/dL — ABNORMAL HIGH (ref 0.61–1.24)
GFR, Estimated: 35 mL/min — ABNORMAL LOW (ref >60)
Glucose, Bld: 147 mg/dL — ABNORMAL HIGH (ref 70–99)
Potassium: 3.7 mmol/L (ref 3.5–5.1)
Sodium: 133 mmol/L — ABNORMAL LOW (ref 135–145)

## 2022-11-04 MED ORDER — ASPIRIN 81 MG PO TBEC
81.0000 mg | DELAYED_RELEASE_TABLET | Freq: Every day | ORAL | Status: DC
  Administered 2022-11-04 – 2022-11-06 (×3): 81 mg via ORAL
  Filled 2022-11-04 (×3): qty 1

## 2022-11-04 MED ORDER — FUROSEMIDE 10 MG/ML IJ SOLN
20.0000 mg | Freq: Once | INTRAMUSCULAR | Status: AC
  Administered 2022-11-04: 20 mg via INTRAVENOUS
  Filled 2022-11-04: qty 2

## 2022-11-04 MED ORDER — FUROSEMIDE 10 MG/ML IJ SOLN
40.0000 mg | Freq: Three times a day (TID) | INTRAMUSCULAR | Status: AC
  Administered 2022-11-04 (×2): 40 mg via INTRAVENOUS
  Filled 2022-11-04 (×2): qty 4

## 2022-11-04 MED ORDER — ALBUTEROL SULFATE (2.5 MG/3ML) 0.083% IN NEBU
2.5000 mg | INHALATION_SOLUTION | RESPIRATORY_TRACT | Status: DC | PRN
  Administered 2022-11-04: 2.5 mg via RESPIRATORY_TRACT
  Filled 2022-11-04: qty 3

## 2022-11-04 MED ORDER — POTASSIUM CHLORIDE CRYS ER 20 MEQ PO TBCR
20.0000 meq | EXTENDED_RELEASE_TABLET | Freq: Three times a day (TID) | ORAL | Status: AC
  Administered 2022-11-04 (×3): 20 meq via ORAL
  Filled 2022-11-04 (×3): qty 1

## 2022-11-04 MED ORDER — CLOPIDOGREL BISULFATE 75 MG PO TABS
75.0000 mg | ORAL_TABLET | Freq: Every day | ORAL | Status: DC
  Administered 2022-11-04 – 2022-11-05 (×2): 75 mg via ORAL
  Filled 2022-11-04 (×2): qty 1

## 2022-11-04 MED ORDER — FUROSEMIDE 40 MG PO TABS: 40.0000 mg | ORAL_TABLET | Freq: Every day | ORAL | Status: DC

## 2022-11-04 MED ORDER — FUROSEMIDE 10 MG/ML IJ SOLN: 40.0000 mg | Freq: Four times a day (QID) | INTRAMUSCULAR | Status: DC

## 2022-11-04 NOTE — Anesthesia Postprocedure Evaluation (Signed)
Anesthesia Post Note  Patient: [name]  Procedure(s) Performed: CORONARY ARTERY BYPASS GRAFTING (CABG) X 5 USING LEFT INTERNAL MAMMARY ARTERY AND ENDOSCOPICALLY HARVESTED RIGHT GREATER SAPHENOUS VEIN (Chest) TRANSESOPHAGEAL ECHOCARDIOGRAM     Patient location during evaluation: SICU Anesthesia Type: General Level of consciousness: sedated Pain management: pain level controlled Vital Signs Assessment: post-procedure vital signs reviewed and stable Respiratory status: patient remains intubated per anesthesia plan Cardiovascular status: stable Postop Assessment: no apparent nausea or vomiting Anesthetic complications: no  No notable events documented.  Last Vitals:  Vitals:   11/04/22 0000 11/04/22 0347  BP: 129/66   Pulse: 63   Resp: (!) 22   Temp: 36.8 C 36.8 C  SpO2: 100%     Last Pain:  Vitals:   11/04/22 0347  TempSrc: Oral  PainSc:                  Felise Georgia S

## 2022-11-04 NOTE — Progress Notes (Signed)
      [address]       [place] [address]             [phone]      4 Days Post-Op Procedure(s) (LRB): CORONARY ARTERY BYPASS GRAFTING (CABG) X 5 USING LEFT INTERNAL MAMMARY ARTERY AND ENDOSCOPICALLY HARVESTED RIGHT GREATER SAPHENOUS VEIN (N/A) TRANSESOPHAGEAL ECHOCARDIOGRAM (N/A) Subjective: Transferred from ICU yesterday.  Said he had a rough night, did not rest well. Denies pain.   Walked 470 ft in the unit yesterday.  Large BM yesterday.   Objective: Vital signs in last 24 hours: Temp:  [97.7 F (36.5 C)-98.3 F (36.8 C)] 98.3 F (36.8 C) (05/07 0347) Pulse Rate:  [55-98] 63 (05/07 0000) Cardiac Rhythm: Atrial fibrillation (05/06 1900) Resp:  [19-30] 22 (05/07 0000) BP: (115-143)/(62-83) 129/66 (05/07 0000) SpO2:  [75 %-100 %] 100 % (05/07 0000)      Intake/Output from previous day: 05/06 0701 - 05/07 0700 In: 150 [P.O.:150] Out: 900 [Urine:900] Intake/Output this shift: No intake/output data recorded.  General appearance: alert, cooperative, and mild distress Neurologic: intact Heart: Current SR at 55-60/min. Was in a-fib most of the day yesterday.  Lungs: breath sopunds are clear. O2 sat 100% on 3Lnc.  Abdomen: soft, no tenderness.  Wound: the sternotomy incision is intact and dry. Expected RLE bruising along EVH tunnel.   Lab Results: Recent Labs    11/02/22 0408 11/04/22 0154  WBC 11.6* 10.5  HGB 9.5* 10.0*  HCT 28.1* 28.0*  PLT 130* 198   BMET:  Recent Labs    11/03/22 0237 11/04/22 0154  NA 135 133*  K 3.8 3.7  CL 105 104  CO2 22 23  GLUCOSE 108* 147*  BUN 28* 32*  CREATININE 1.66* 1.90*  CALCIUM 7.8* 8.0*    PT/INR: No results for input(s): "LABPROT", "INR" in the last 72 hours. ABG    Component Value Date/Time   PHART 7.313 (L) 10/31/2022 2120   HCO3 20.4 10/31/2022 2120   TCO2 22 10/31/2022 2120   ACIDBASEDEF 5.0 (H) 10/31/2022 2120   O2SAT 96 10/31/2022 2120   CBG (last 3)  Recent Labs     11/03/22 1618 11/03/22 2102 11/04/22 0601  GLUCAP 107* 118* 100*    Assessment/Plan: S/P Procedure(s) (LRB): CORONARY ARTERY BYPASS GRAFTING (CABG) X 5 USING LEFT INTERNAL MAMMARY ARTERY AND ENDOSCOPICALLY HARVESTED RIGHT GREATER SAPHENOUS VEIN (N/A) TRANSESOPHAGEAL ECHOCARDIOGRAM (N/A)  -POD4 CABG x 5. On ASA, low-dose metoprolol, and rosuvastatin. Adding Plavix today and will reduce the ASA to 81mg .  Progressing with mobility.   -Atrial fibrillation- back in SR this morning, continue oral amio loading. Replace K+.   -RENAL / Volume overload- Mild AKI, creat 1.9 today. Lasix 40mg  IV q8h x 2 doses today and monitor creat.   -PULM- h/o lung cancer s/p right upper lobectomy. CXR showing small bilateral effusions and ATX. Diurese, working on [employer] with IS.   -GI- tolerating PO's, BM yesterday.   -DVT PPX- on daily enoxaparin, continue working on ambulation.    -Disposition- anticipate discharge to home with family later this week. Needs more diuresis and ambulation.     LOS: 8 days    Leary Roca, [place] [phone] 11/04/2022

## 2022-11-04 NOTE — Progress Notes (Signed)
CARDIAC REHAB PHASE I   PRE:  Rate/Rhythm: 61 NSR  BP:  Sitting: 144/75      SaO2: 100 2L  MODE:  Ambulation: 470 ft   AD:  RW  POST:  Rate/Rhythm: 77 NSR  BP:  Sitting: 151/70      SaO2: 100 2L  Pt amb with supervision assistance, pt denies CP and SOB during amb and was returned to room w/o complaint. Pt walked on 2L, SaO2 upper 90s throughout entire visit.   Faustino Congress  ACSM-CEP 9:20 AM 11/04/2022    Service time is from 0848 to [phone].

## 2022-11-04 NOTE — Progress Notes (Signed)
CARDIAC REHAB PHASE I   PRE:  Rate/Rhythm: 58 NSR  BP:  Sitting: 115/63      SaO2: 100 2L  MODE:  Ambulation: 470 ft   AD:  RW  POST:  Rate/Rhythm: 72 NSR  BP:  Sitting: 126/67      SaO2: 100 1L  Pt amb with supervision assistance, pt denies CP and SOB during amb and was returned to room w/o complaint. Pt sats stable on 1L while amb, pt left on 1L in room. Consider placing on RA.   [name]  ACSM-CEP 1:54 PM 11/04/2022    Service time is from 1330 to 1354.

## 2022-11-05 ENCOUNTER — Other Ambulatory Visit (HOSPITAL_COMMUNITY): Payer: Self-pay

## 2022-11-05 DIAGNOSIS — I480 Paroxysmal atrial fibrillation: Secondary | ICD-10-CM

## 2022-11-05 DIAGNOSIS — Z951 Presence of aortocoronary bypass graft: Secondary | ICD-10-CM | POA: Diagnosis not present

## 2022-11-05 DIAGNOSIS — R079 Chest pain, unspecified: Secondary | ICD-10-CM | POA: Diagnosis not present

## 2022-11-05 LAB — BASIC METABOLIC PANEL
Anion gap: 8 (ref 5–15)
BUN: 35 mg/dL — ABNORMAL HIGH (ref 8–23)
CO2: 25 mmol/L (ref 22–32)
Calcium: 8.3 mg/dL — ABNORMAL LOW (ref 8.9–10.3)
Chloride: 103 mmol/L (ref 98–111)
Creatinine, Ser: 1.8 mg/dL — ABNORMAL HIGH (ref 0.61–1.24)
GFR, Estimated: 38 mL/min — ABNORMAL LOW (ref >60)
Glucose, Bld: 116 mg/dL — ABNORMAL HIGH (ref 70–99)
Potassium: 3.4 mmol/L — ABNORMAL LOW (ref 3.5–5.1)
Sodium: 136 mmol/L (ref 135–145)

## 2022-11-05 LAB — GLUCOSE, CAPILLARY
Glucose-Capillary: 118 mg/dL — ABNORMAL HIGH (ref 70–99)
Glucose-Capillary: 94 mg/dL (ref 70–99)
Glucose-Capillary: 96 mg/dL (ref 70–99)

## 2022-11-05 MED ORDER — AMIODARONE HCL 200 MG PO TABS
400.0000 mg | ORAL_TABLET | Freq: Two times a day (BID) | ORAL | Status: DC
  Administered 2022-11-05 – 2022-11-06 (×2): 400 mg via ORAL
  Filled 2022-11-05 (×2): qty 2

## 2022-11-05 MED ORDER — APIXABAN 2.5 MG PO TABS
2.5000 mg | ORAL_TABLET | Freq: Two times a day (BID) | ORAL | Status: DC
  Administered 2022-11-05 – 2022-11-06 (×3): 2.5 mg via ORAL
  Filled 2022-11-05 (×3): qty 1

## 2022-11-05 MED ORDER — POTASSIUM CHLORIDE CRYS ER 20 MEQ PO TBCR
30.0000 meq | EXTENDED_RELEASE_TABLET | Freq: Three times a day (TID) | ORAL | Status: AC
  Administered 2022-11-05 (×3): 30 meq via ORAL
  Filled 2022-11-05 (×3): qty 1

## 2022-11-05 MED ORDER — FUROSEMIDE 10 MG/ML IJ SOLN
40.0000 mg | Freq: Once | INTRAMUSCULAR | Status: AC
  Administered 2022-11-05: 40 mg via INTRAVENOUS
  Filled 2022-11-05: qty 4

## 2022-11-05 MED ORDER — AMIODARONE HCL 200 MG PO TABS: 200.0000 mg | ORAL_TABLET | Freq: Two times a day (BID) | ORAL | Status: DC

## 2022-11-05 MED ORDER — FUROSEMIDE 40 MG PO TABS
40.0000 mg | ORAL_TABLET | Freq: Every day | ORAL | Status: DC
  Administered 2022-11-06: 40 mg via ORAL
  Filled 2022-11-05: qty 1

## 2022-11-05 NOTE — Progress Notes (Addendum)
Appreciate recommendations from cardiology regarding PAF / bradycardia.   -metoprolol discontinued due to bradycardia tendency -Plavix and enoxaparin discontinued. -Will start apixaban 2.5mg  BID (low-dose for age and creatinine >1.5) -Continue amiodarone loading as recommended.  Mr. Hollie continues to progress with mobility and has been weaned to RA.   Anticipate discharge to home tomorrow.   Gaynelle Arabian, PA-C

## 2022-11-05 NOTE — Progress Notes (Addendum)
[address]       [place] [address]             [phone]      5 Days Post-Op Procedure(s) (LRB): CORONARY ARTERY BYPASS GRAFTING (CABG) X 5 USING LEFT INTERNAL MAMMARY ARTERY AND ENDOSCOPICALLY HARVESTED RIGHT GREATER SAPHENOUS VEIN (N/A) TRANSESOPHAGEAL ECHOCARDIOGRAM (N/A) Subjective: Siting up eating breakfast, says he had a good day yesterday.  Walked  470' x 2 yesterday.  O2 at 3L/[place]  Objective: Vital signs in last 24 hours: Temp:  [97.6 F (36.4 C)-98.3 F (36.8 C)] 98.3 F (36.8 C) (05/08 0000) Pulse Rate:  [53-66] 66 (05/08 0000) Cardiac Rhythm: Normal sinus rhythm (05/07 2012) Resp:  [20-23] 23 (05/08 0000) BP: (115-144)/(61-76) 115/76 (05/08 0000) SpO2:  [100 %] 100 % (05/08 0000)   Intake/Output from previous day: 05/07 0701 - 05/08 0700 In: -  Out: 2550 [Urine:2550] Intake/Output this shift: No intake/output data recorded.  General appearance: alert, cooperative, and no distress Neurologic: intact Heart: Current SR at 55-60/min. Was in a-fib with VR ~100 last night and earlier this morning. Lungs: breath sounds are clear but shallow. O2 sat 100% on 3Lnc.  Abdomen: soft, no tenderness.  Wound: the sternotomy incision is intact and dry. Expected RLE bruising along EVH tunnel.  EXTS: LE edema is slowly resolving  Lab Results: Recent Labs    11/04/22 0154  WBC 10.5  HGB 10.0*  HCT 28.0*  PLT 198    BMET:  Recent Labs    11/04/22 0154 11/05/22 0056  NA 133* 136  K 3.7 3.4*  CL 104 103  CO2 23 25  GLUCOSE 147* 116*  BUN 32* 35*  CREATININE 1.90* 1.80*  CALCIUM 8.0* 8.3*     PT/INR: No results for input(s): "LABPROT", "INR" in the last 72 hours. ABG    Component Value Date/Time   PHART 7.313 (L) 10/31/2022 2120   HCO3 20.4 10/31/2022 2120   TCO2 22 10/31/2022 2120   ACIDBASEDEF 5.0 (H) 10/31/2022 2120   O2SAT 96 10/31/2022 2120   CBG (last 3)  Recent Labs    11/04/22 1131 11/04/22 1644 11/04/22 2153   GLUCAP 132* 90 125*     Assessment/Plan: S/P Procedure(s) (LRB): CORONARY ARTERY BYPASS GRAFTING (CABG) X 5 USING LEFT INTERNAL MAMMARY ARTERY AND ENDOSCOPICALLY HARVESTED RIGHT GREATER SAPHENOUS VEIN (N/A) TRANSESOPHAGEAL ECHOCARDIOGRAM (N/A)  -POD5 CABG x 5. On ASA, low-dose metoprolol, rosuvastatin and Plavix.  Progressing with mobility.   -Atrial fibrillation- in and out of a-fib over the past 24 hours, continue oral amio loading. Replace K+. May need to consider anticoagulation prior to discharge  -RENAL / Volume overload- Mild AKI, creat 1.8 today and trending down. No weights recorded since transferred to 4E. Lasix 40mg  IV x 1 dose today, get accurate weight, and monitor creat.   -PULM- h/o lung cancer s/p right upper lobectomy. CXR showing small bilateral effusions and ATX. Diurese, working on [employer] with IS.   -GI- tolerating PO's, having bowel movements.   -DVT PPX- on daily enoxaparin, continue working on ambulation.   -Disposition- anticipate discharge to home with family later this week. Needs more diuresis and ambulation.     LOS: 9 days    [name], [place] [phone] 11/05/2022  Patient seen and examined, agree with above Plan d/w [name] Looks like mostly SR and SB over past 24 hours- will ask Cards for input re: anticoagulation Creatinine 1.8, diuresed well yesterday Will give IV Lasix  again today  Salvatore Decent. Dorris Fetch, MD [facility] [phone]

## 2022-11-05 NOTE — Progress Notes (Signed)
CARDIAC REHAB PHASE I   PRE:  Rate/Rhythm: 58 SB    BP: sitting 138/65    SpO2: 99 1L  MODE:  Ambulation: 940 ft   POST:  Rate/Rhythm: 78 SR    BP: sitting 143/55     SpO2: 97 RA   Pt up in recliner on 3L for unknown reasons. Able to d/c O2, pt did not need for ambulation. Pt stood independently and walked independently with RW. He has RW at home. No c/o, feels well, increased distance. Return to recliner. Reminders given for getting help with elevating his feet (pt admittedly forgetful of sternal precautions). Encouraged walking on his own, elevating feet, IS/flutter. Doing well. Gave d/c video to view. Will educate with his family. [phone]  Ethelda Chick BS, ACSM-CEP 11/05/2022 10:30 AM

## 2022-11-05 NOTE — Progress Notes (Addendum)
Rounding Note    Patient Name: [name] Date of Encounter: 11/05/2022  [facility] Cardiologist: Rollene Rotunda, MD   Subjective   Currently in sinus in the 60s after working with cardiac rehab. He is unaware of his Afib.  Inpatient Medications    Scheduled Meds:  acetaminophen  1,000 mg Oral Q6H   Or   acetaminophen (TYLENOL) oral liquid 160 mg/5 mL  1,000 mg Per Tube Q6H   amiodarone  400 mg Oral BID   aspirin EC  81 mg Oral Daily   clopidogrel  75 mg Oral Daily   enoxaparin (LOVENOX) injection  30 mg Subcutaneous QHS   furosemide  40 mg Intravenous Once   [START ON 11/06/2022] furosemide  40 mg Oral Daily   guaiFENesin  1,200 mg Oral BID   insulin aspart  0-15 Units Subcutaneous TID WC   metoprolol tartrate  12.5 mg Oral BID   Or   metoprolol tartrate  12.5 mg Per Tube BID   pantoprazole  40 mg Oral Daily   potassium chloride  30 mEq Oral TID   rosuvastatin  40 mg Oral Daily   sodium chloride flush  3 mL Intravenous Q12H   Continuous Infusions:  sodium chloride     PRN Meds: sodium chloride, albuterol, alum & mag hydroxide-simeth, benzonatate, diphenhydrAMINE, magnesium hydroxide, metoprolol tartrate, ondansetron (ZOFRAN) IV, mouth rinse, oxyCODONE, sodium chloride flush   Vital Signs    Vitals:   11/04/22 2100 11/05/22 0000 11/05/22 0751 11/05/22 0807  BP: 138/66 115/76 130/63   Pulse:  66 62   Resp:  (!) 23 16   Temp: 98.3 F (36.8 C) 98.3 F (36.8 C) 98.2 F (36.8 C)   TempSrc: Oral Oral Oral   SpO2:  100% 100%   Weight:    113.1 kg  Height:        Intake/Output Summary (Last 24 hours) at 11/05/2022 0943 Last data filed at 11/05/2022 0800 Gross per 24 hour  Intake 360 ml  Output 2200 ml  Net -1840 ml      11/05/2022    8:07 AM 11/03/2022    2:47 AM 11/02/2022    5:00 AM  Last 3 Weights  Weight (lbs) 249 lb 5.4 oz 257 lb 15 oz 258 lb 13.1 oz  Weight (kg) 113.1 kg 117 kg 117.4 kg      Telemetry    Bouts of Afib with RVR in the  120s, now sinus - SB in the 50-60s - Personally Reviewed  ECG    No new tracings - Personally Reviewed  Physical Exam   GEN: No acute distress.   Neck: No JVD Cardiac: RRR, no murmurs, rubs, or gallops.  Respiratory: Clear to auscultation bilaterally. GI: Soft, nontender, non-distended  MS: No edema; No deformity. Neuro:  Nonfocal  Psych: Normal affect  Sternotomy C/D/I  Labs    High Sensitivity Troponin:   Recent Labs  Lab 10/27/22 1329 10/27/22 1533 10/27/22 2110 10/27/22 2223  TROPONINIHS 84* 71* 92* 93*     Chemistry Recent Labs  Lab 10/31/22 1916 10/31/22 2120 11/01/22 0342 11/01/22 1614 11/02/22 0408 11/03/22 0237 11/04/22 0154 11/05/22 0056  NA 135   < > 135 134*   < > 135 133* 136  K 4.4   < > 4.3 4.2   < > 3.8 3.7 3.4*  CL 106  --  105 104   < > 105 104 103  CO2 21*  --  21* 21*   < >  22 23 25   GLUCOSE 118*  --  123* 140*   < > 108* 147* 116*  BUN 16  --  18 25*   < > 28* 32* 35*  CREATININE 1.05  --  1.39* 1.89*   < > 1.66* 1.90* 1.80*  CALCIUM 7.5*  --  7.6* 7.7*   < > 7.8* 8.0* 8.3*  MG 3.0*  --  2.7* 2.7*  --   --   --   --   GFRNONAA >60  --  51* 35*   < > 41* 35* 38*  ANIONGAP 8  --  9 9   < > 8 6 8    < > = values in this interval not displayed.    Lipids No results for input(s): "CHOL", "TRIG", "HDL", "LABVLDL", "LDLCALC", "CHOLHDL" in the last 168 hours.  Hematology Recent Labs  Lab 11/01/22 1614 11/02/22 0408 11/04/22 0154  WBC 12.0* 11.6* 10.5  RBC 2.94* 2.87* 3.01*  HGB 9.6* 9.5* 10.0*  HCT 28.6* 28.1* 28.0*  MCV 97.3 97.9 93.0  MCH 32.7 33.1 33.2  MCHC 33.6 33.8 35.7  RDW 14.0 14.1 13.8  PLT 132* 130* 198   Thyroid No results for input(s): "TSH", "FREET4" in the last 168 hours.  BNPNo results for input(s): "BNP", "PROBNP" in the last 168 hours.  DDimer No results for input(s): "DDIMER" in the last 168 hours.   Radiology    DG Chest 2 View  Result Date: 11/04/2022 CLINICAL DATA:  Follow-up atelectasis.  Status post CABG.  EXAM: CHEST - 2 VIEW COMPARISON:  11/02/2022 FINDINGS: Postoperative change from median sternotomy and CABG procedure. Heart size and mediastinal contours are stable. Interval removal of right IJ catheter sheath, mediastinal drain, and left chest tube. No pneumothorax visualized. Small pleural effusions and bibasilar atelectasis noted. No interstitial edema. IMPRESSION: Small bilateral pleural effusions and bibasilar atelectasis. No pneumothorax identified after removal of mediastinal drain and chest tubes. Electronically Signed   By: Signa Kell M.D.   On: 11/04/2022 07:15    Cardiac Studies   LHC 10/29/22:   Ost LAD lesion is 70% stenosed with 70% stenosed side branch in Ost Cx to Prox Cx.   Mid LAD lesion is 80% stenosed.   Prox Cx to Mid Cx lesion is 70% stenosed with 85% stenosed side branch in 1st Mrg.  Mid Cx lesion is 85% stenosed.   Ost RCA lesion is 70% stenosed.  RPAV lesion is 90% stenosed.   LV end diastolic pressure is normal.   There is no aortic valve stenosis.   POST-CATH DIAGNOSES SEVERE MULTIVESSEL DISEASE: Not amenable to PCI Ostial RCA at least 70% stenosis with mild diffuse disease elsewhere but 90% stenosis branch point in the RPA V (very high bifurcation into PAV and PDA) Separate ostia for LAD and LCx: Ostial LAD 70%, mid LAD 80% Ostial and proximal LCx 70% -> continues beyond OM1 followed by 85% stenosis in the mid AVG prior to OM 2; OM 1 has proximal 85% stenosis Normal LVEDP.   RECOMMENDATIONS Return to nursing unit for ongoing care.  Will restart IV heparin CVTS consultation for CABG Continue to push GDMT for CAD.   Echo 10/28/22:  1. Left ventricular ejection fraction, by estimation, is 60 to 65%. The  left ventricle has normal function. The left ventricle has no regional  wall motion abnormalities. There is mild concentric left ventricular  hypertrophy. Left ventricular diastolic  parameters are consistent with Grade II diastolic dysfunction   (pseudonormalization).   2.  Right ventricular systolic function is normal. The right ventricular  size is normal. Tricuspid regurgitation signal is inadequate for assessing  PA pressure.   3. Left atrial size was mildly dilated.   4. The mitral valve is grossly normal. No evidence of mitral valve  regurgitation.   5. The aortic valve was not well visualized. There is mild calcification  of the aortic valve. Aortic valve regurgitation is not visualized. No  aortic stenosis is present.   Patient Profile     82 y.o. male with prior history of prostate cancer s/p cryoablation 2013, lung cancer s/p RUL, HTN, HLD, remote brain tumor s/p craniotomy, and CAD with heart cath this admission showing severe 3v disease now s/p CABG. Cardiology asked to re-engage for PAF and bracycardia when in sinus rhythm.   Assessment & Plan    PAF Sinus bradycardia - post-op course complicated by Afib treated with amiodarone - pt has intermittent sinus rhythm and bradycardia prompting re-engagement with cardiology - telemetry reviewed and shows sinus to sinus bradycardia, bout of Afib with RVR - will stop metoprolol for now and continue amiodarone 400 mg BID - will likely continue amiodarone 3-6 months after surgery and then attempt to wean off +/-  heart monitor since he is unaware of his Afib - will monitor on telemetry another 24 hr to see if we need to further adjust amiodarone   Need for oral anticoagulation This patients CHA2DS2-VASc Score and unadjusted Ischemic Stroke Rate (% per year) is equal to 4.8 % stroke rate/year from a score of 4 (HTN, CAD, 2age) - since he is asymptomatic with paroxysms of Afib, would recommend anticoagulation when safe per surgery - recommend discontinuation of one antiplatelet, will likely need to keep on one as he was an ACS presentation. Could consider continue ASA and eliquis - start eliquis when OK with CT surgery and will defer final decision regarding antiplatelet to  surgery team   CAD s/p CABG x 5 Now s/p CABG x 5 with LIMA-LAD, SVG-OM1-OM2-OM3, SVG-acute marginal - on ASA, plavix, 12.5 mg lopressor BID, crestor - stop lopressor for now   Hypertension - generally controlled   Hyperlipidemia with LDL goal < 70 10/28/2022: Cholesterol 111; HDL 43; LDL Cholesterol 51; Triglycerides 85; VLDL 17 LP (a) 22.4 Crestor increased 40 mg this admission      For questions or updates, please contact [facility] Please consult [email or web address] for contact info under        Signed, Vernon Duster, PA  11/05/2022, 9:43 AM    Patient seen and examined and agree with Vernon Flesher, PA as detailed above.  In brief, the patient is a 83 y.o. male with prior history of prostate cancer s/p cryoablation 2013, lung cancer s/p RUL, HTN, HLD, remote brain tumor s/p craniotomy, and CAD with heart cath this admission showing severe 3v disease now s/p CABG. Cardiology asked to re-evaluate for further evaluation of post-op Afib and sinus bradycardia.  Patient developed Afib in the post-operative setting and is relatively asymptomatic. Was started on amiodarone and metop and when in NSR, he is bradycardic with HR mainly in the 50s. He is asymptomatic with the bradycardia and is able to work with PT without issue. He denies lightheadedness, dizziness, palpitations or SOB with activity. Will plan to continue amiodarone load and hold metop for now. Agree with starting AC with apixaban once cleared from surgical standpoint given continued paroxysmal episodes of Afib on the monitor with plans to wean off amiodarone as outpatient,  check cardiac monitor and if no recurrence of Afib, can discontinue AC at that time. Agree with dropping either ASA or plavix with addition of apixaban (whichever CT surgery prefers). We will arrange for outpatient follow-up with Cardiology for further management.  GEN: No acute distress.  Sitting in a chair Neck: No JVD Cardiac: Bradycardic,  regular, no murmurs Respiratory: Clear to auscultation bilaterally. GI: Soft, nontender, non-distended  MS: No edema; No deformity. Neuro:  Nonfocal  Psych: Normal affect    Plan: -Continue amiodarone load with 400mg  BID x7 days, 200mg  BID x7 days and then 200mg  daily thereafter with goal to wean off in the outpatient setting -Recommend starting [facility] with apixaban once okay from a CV perspective given continued paroxysms of Afib; plan for outpatient monitor once off amiodarone to assess for recurrence once out of post-op period and can reassess need for [facility] at that time -Stop metop due to bradycardia; can add as able as outpatient -Recommend stopping either ASA or plavix once started on apxiaban -Will arrange for outpatient Cardiology follow-up for further management  Vernon Flatten, MD

## 2022-11-06 ENCOUNTER — Other Ambulatory Visit (HOSPITAL_COMMUNITY): Payer: Self-pay

## 2022-11-06 ENCOUNTER — Other Ambulatory Visit: Payer: Self-pay | Admitting: Physician Assistant

## 2022-11-06 LAB — GLUCOSE, CAPILLARY: Glucose-Capillary: 94 mg/dL (ref 70–99)

## 2022-11-06 LAB — BASIC METABOLIC PANEL
Anion gap: 7 (ref 5–15)
BUN: 39 mg/dL — ABNORMAL HIGH (ref 8–23)
CO2: 27 mmol/L (ref 22–32)
Calcium: 8.4 mg/dL — ABNORMAL LOW (ref 8.9–10.3)
Chloride: 102 mmol/L (ref 98–111)
Creatinine, Ser: 1.83 mg/dL — ABNORMAL HIGH (ref 0.61–1.24)
GFR, Estimated: 37 mL/min — ABNORMAL LOW (ref >60)
Glucose, Bld: 114 mg/dL — ABNORMAL HIGH (ref 70–99)
Potassium: 4 mmol/L (ref 3.5–5.1)
Sodium: 136 mmol/L (ref 135–145)

## 2022-11-06 MED ORDER — HYDROCOD POLI-CHLORPHE POLI ER 10-8 MG/5ML PO SUER
5.0000 mL | Freq: Every evening | ORAL | 0 refills | Status: DC | PRN
  Filled 2022-11-06: qty 115, 23d supply, fill #0

## 2022-11-06 MED ORDER — POTASSIUM CHLORIDE CRYS ER 20 MEQ PO TBCR
20.0000 meq | EXTENDED_RELEASE_TABLET | Freq: Every day | ORAL | 0 refills | Status: DC
  Filled 2022-11-06: qty 14, 14d supply, fill #0

## 2022-11-06 MED ORDER — FUROSEMIDE 40 MG PO TABS
40.0000 mg | ORAL_TABLET | Freq: Every day | ORAL | 0 refills | Status: DC
  Filled 2022-11-06: qty 14, 14d supply, fill #0

## 2022-11-06 MED ORDER — OXYCODONE HCL 5 MG PO TABS
5.0000 mg | ORAL_TABLET | Freq: Four times a day (QID) | ORAL | 0 refills | Status: AC | PRN
  Filled 2022-11-06: qty 20, 5d supply, fill #0

## 2022-11-06 MED ORDER — AMIODARONE HCL 200 MG PO TABS
400.0000 mg | ORAL_TABLET | Freq: Two times a day (BID) | ORAL | 2 refills | Status: DC
  Filled 2022-11-06: qty 60, 44d supply, fill #0

## 2022-11-06 MED ORDER — HYDROCOD POLI-CHLORPHE POLI ER 10-8 MG/5ML PO SUER: 5.0000 mL | Freq: Two times a day (BID) | ORAL | 0 refills | Status: DC | PRN

## 2022-11-06 MED ORDER — ROSUVASTATIN CALCIUM 40 MG PO TABS
40.0000 mg | ORAL_TABLET | Freq: Every day | ORAL | 2 refills | Status: DC
  Filled 2022-11-06: qty 30, 30d supply, fill #0

## 2022-11-06 MED ORDER — APIXABAN 2.5 MG PO TABS
2.5000 mg | ORAL_TABLET | Freq: Two times a day (BID) | ORAL | 2 refills | Status: DC
  Filled 2022-11-06: qty 60, 30d supply, fill #0

## 2022-11-06 MED ORDER — GUAIFENESIN ER 600 MG PO TB12
1200.0000 mg | ORAL_TABLET | Freq: Two times a day (BID) | ORAL | 1 refills | Status: DC
  Filled 2022-11-06: qty 30, 8d supply, fill #0

## 2022-11-06 NOTE — Progress Notes (Unsigned)
Patient requested this Rx to be sent to [facility] instead of [facility].   Gaynelle Arabian, PA-C

## 2022-11-06 NOTE — Progress Notes (Signed)
Discharge instructions given. Patient verbalized understanding and all questions were answered.  ?

## 2022-11-06 NOTE — Progress Notes (Signed)
      [address]       [place] [address]             [phone]      6 Days Post-Op Procedure(s) (LRB): CORONARY ARTERY BYPASS GRAFTING (CABG) X 5 USING LEFT INTERNAL MAMMARY ARTERY AND ENDOSCOPICALLY HARVESTED RIGHT GREATER SAPHENOUS VEIN (N/A) TRANSESOPHAGEAL ECHOCARDIOGRAM (N/A) Subjective:  Feels good, no new concerns. Walked several times in the hall yesterday while on RA without dyspnea.   Would like to return home today.    Objective: Vital signs in last 24 hours: Temp:  [97.8 F (36.6 C)-98.5 F (36.9 C)] 97.8 F (36.6 C) (05/09 0327) Pulse Rate:  [60-108] 66 (05/09 0327) Cardiac Rhythm: Sinus bradycardia (05/08 1900) Resp:  [16-20] 20 (05/09 0327) BP: (121-147)/(59-67) 147/67 (05/09 0327) SpO2:  [90 %-100 %] 99 % (05/09 0327) Weight:  [112.1 kg-113.1 kg] 112.1 kg (05/09 0327)   Intake/Output from previous day: 05/08 0701 - 05/09 0700 In: 600 [P.O.:600] Out: 1775 [Urine:1775] Intake/Output this shift: No intake/output data recorded.  General appearance: alert, cooperative, and no distress Neurologic: intact Heart:  SR in the 60's, no further atrial fibrillation Lungs: breath sounds are clear but shallow. O2 sat  99% on RA Abdomen: soft, no tenderness.  Wound: the sternotomy incision is intact and dry, mild erythema along edges. Expected RLE bruising along EVH tunnel.  EXTS: LE edema is slowly resolving  Lab Results: Recent Labs    11/04/22 0154  WBC 10.5  HGB 10.0*  HCT 28.0*  PLT 198    BMET:  Recent Labs    11/05/22 0056 11/06/22 0133  NA 136 136  K 3.4* 4.0  CL 103 102  CO2 25 27  GLUCOSE 116* 114*  BUN 35* 39*  CREATININE 1.80* 1.83*  CALCIUM 8.3* 8.4*     PT/INR: No results for input(s): "LABPROT", "INR" in the last 72 hours. ABG    Component Value Date/Time   PHART 7.313 (L) 10/31/2022 2120   HCO3 20.4 10/31/2022 2120   TCO2 22 10/31/2022 2120   ACIDBASEDEF 5.0 (H) 10/31/2022 2120   O2SAT 96 10/31/2022  2120   CBG (last 3)  Recent Labs    11/05/22 1644 11/05/22 2113 11/06/22 0605  GLUCAP 94 96 94     Assessment/Plan: S/P Procedure(s) (LRB): CORONARY ARTERY BYPASS GRAFTING (CABG) X 5 USING LEFT INTERNAL MAMMARY ARTERY AND ENDOSCOPICALLY HARVESTED RIGHT GREATER SAPHENOUS VEIN (N/A) TRANSESOPHAGEAL ECHOCARDIOGRAM (N/A)  -POD6 CABG x 5. On ASA, rosuvastatin.  Progressing with mobility.   -Atrial fibrillation- Maintaining SR for past 24 hours. Metoprolol stopped due to bradycardia. HR mid 60's this morning. Continuing amiodarone loading and apixaban added for PAF.  -RENAL / Volume overload- Mild AKI, creat 1.8 and stable. Now below pre-op Wt.   -PULM- h/o lung cancer s/p right upper lobectomy. He continues to work on [employer] with IS.   -GI- tolerating PO's, having bowel movements.   -DVT PPX- d/c'd enoxaparin, now on apixaban.  -Disposition- plan discharge to home with family today.  Instructions given, follow up arranged.     LOS: 10 days    Leary Roca, [place] [phone] 11/06/2022

## 2022-11-06 NOTE — TOC Transition Note (Signed)
Transition of Care (TOC) - CM/SW Discharge Note Donn Pierini RN, BSN Transitions of Care Unit 4E- RN Case Manager See Treatment Team for direct phone #   Patient Details  Name: [name] MRN: [id number] Date of Birth: 03/30/1941  Transition of Care [facility]) CM/SW Contact:  Darrold Span, RN Phone Number: 11/06/2022, 12:31 PM   Clinical Narrative:    Pt stable for transition home today, no HH or DME needs noted.  Pt started on Eliquis- per benefits check pt does not have Medicare part D drug plan coverage- uses [facility] and usually pays out of pocket.  PharmD has spoken with pt and filled out patient assistance application for pt and faxed for Eliquis assistance.  TOC pharmacy has filled first 30 day with trail coupon at no cost.   No further TOC needs noted. Family to transport home   Final next level of care: Home/Self Care Barriers to Discharge: Barriers Resolved   Patient Goals and CMS Choice CMS Medicare.gov Compare Post Acute Care list provided to:: Patient Choice offered to / list presented to : NA  Discharge Placement               Home          Discharge Plan and Services Additional resources added to the After Visit Summary for   In-house Referral: NA Discharge Planning Services: CM Consult Post Acute Care Choice: Home Health          DME Arranged: N/A DME Agency: NA       HH Arranged: NA HH Agency: NA        Social Determinants of Health (SDOH) Interventions SDOH Screenings   Food Insecurity: No Food Insecurity (11/05/2022)  Housing: Low Risk  (10/31/2022)  Transportation Needs: No Transportation Needs (11/05/2022)  Utilities: Not At Risk (11/05/2022)  Alcohol Screen: Low Risk  (12/03/2020)  Depression (PHQ2-9): Low Risk  (10/24/2022)  Tobacco Use: Medium Risk (11/03/2022)     Readmission Risk Interventions     No data to display

## 2022-11-06 NOTE — Care Management Important Message (Signed)
Important Message  Patient Details  Name: [name] MRN: [id number] Date of Birth: [birth year]   Medicare Important Message Given:  Yes     Renie Ora 11/06/2022, 12:01 PM

## 2022-11-06 NOTE — Progress Notes (Signed)
Pt received OHS book and education on restrictions, heart healthy diet, ex guidelines, Move in the Tube sheet, incentive spirometer use when d/c and CRPII. Pt denies questions and was encouraged to look in the book for additional information. Referral placed to [facility].    Son and pt in room during education.

## 2022-11-07 ENCOUNTER — Other Ambulatory Visit (HOSPITAL_COMMUNITY): Payer: Self-pay

## 2022-11-07 ENCOUNTER — Other Ambulatory Visit: Payer: Self-pay | Admitting: Physician Assistant

## 2022-11-07 MED ORDER — HYDROCOD POLI-CHLORPHE POLI ER 10-8 MG/5ML PO SUER
5.0000 mL | Freq: Two times a day (BID) | ORAL | 0 refills | Status: DC | PRN
  Filled 2022-11-07: qty 115, 12d supply, fill #0

## 2022-11-07 MED FILL — Heparin Sodium (Porcine) Inj 1000 Unit/ML: INTRAMUSCULAR | Qty: 30 | Status: AC

## 2022-11-07 MED FILL — Lidocaine HCl Local Soln Prefilled Syringe 100 MG/5ML (2%): INTRAMUSCULAR | Qty: 5 | Status: AC

## 2022-11-07 MED FILL — Electrolyte-R (PH 7.4) Solution: INTRAVENOUS | Qty: 3000 | Status: AC

## 2022-11-07 MED FILL — Sodium Chloride IV Soln 0.9%: INTRAVENOUS | Qty: 2000 | Status: AC

## 2022-11-07 MED FILL — Heparin Sodium (Porcine) Inj 1000 Unit/ML: INTRAMUSCULAR | Qty: 10 | Status: AC

## 2022-11-07 MED FILL — Mannitol IV Soln 20%: INTRAVENOUS | Qty: 500 | Status: AC

## 2022-11-07 MED FILL — Sodium Bicarbonate IV Soln 8.4%: INTRAVENOUS | Qty: 100 | Status: AC

## 2022-11-07 NOTE — Progress Notes (Signed)
Late note.  Pt called two times after discharge asking about his cough medication. He had gotten his medications from [facility] pharmacy but cough medication was over $70 and he wanted script sent to another pharmacy. This was done through [facility].  His son went to pick up medication and they had none in stock in and were not able to transfer script because it contained a narcotic. When I looked at AVS it did not show medication but was on paperwork he was given. Called PA [place] and she was not in the office and had no way to take care of new script. Explained to pt and MD not able to give script due to narcotic and he understands to call office in morning to address with doctor. Thomas Hoff, RN

## 2022-11-07 NOTE — Progress Notes (Signed)
Patient has contacted hospital in regards to Tussionex prescription.  Originally he felt [facility] was [facility].  He requested this be sent his local [facility].  This was completed by Jillyn Hidden PA-C.  However, [facility] was out of this medication.  He has now called back to have this filled [facility].Marland KitchenMarland Kitchen I will resend an RX to our [facility] pharmacy.  Lowella Dandy, PA-C 11/07/22

## 2022-11-07 NOTE — Progress Notes (Signed)
Late note: 1902 11/07/22  Called pt back and spoke with son about medication being in [facility] main pharmacy in their TOC lock up vault. Signed White sheet to pick up medication is at desk. Will relay information to staff. Family aware that ID will be needed due to narcotic. Son agreeable to pick up tonight if someone can watch his father otherwise will pick up tomorrow.  CN aware. Thomas Hoff, RN

## 2022-11-07 NOTE — Progress Notes (Signed)
Late note: 1800 Pt has called again wanting to find out where his tussionex is located. I explained that I had spoken with him this morning and I understood him to say he was going to send his son to the hospital and pick up medication. I told pt I would have PA call back or I would call him back if anything changed. I called TOC and explained this. TOC was to pass along to staff. TOC has left for the day. I spoke with [facility] and they have located medication and will hold in their TOC lock-up vault. Will call pt.  Pt said he thought the medication was for seizures and he had to have it. I explained this was not the case. Asked to speak with his son and son stated that he returned to be informed by pt's grandson that he thought he may have had a seizure. He stated pt he was told pt was "staring into space and sprawled out". I encouraged son to have pt seen if he was having "seizure like" episode. 1845 I paged On call PA without success. Will attempt to relay to surgeon on call.

## 2022-11-12 ENCOUNTER — Telehealth: Payer: Self-pay | Admitting: Cardiology

## 2022-11-12 NOTE — Telephone Encounter (Signed)
Called [employer]. Spoke with a representative She states they need a collaborating provider written on the form and faxed back. I sent a secure chat to the provider listed on the application Jillyn Hidden, [place]) to see if they still have the application. No response received at this time.  Will forward message to the same provider for review.

## 2022-11-12 NOTE — Telephone Encounter (Signed)
Caller stated she will need further information for patient's application for apixaban (ELIQUIS) 2.5 MG TABS tablet.  Can call ph# [phone] and enter code for Eliquis.

## 2022-11-13 ENCOUNTER — Ambulatory Visit: Payer: Self-pay

## 2022-11-13 DIAGNOSIS — Z4802 Encounter for removal of sutures: Secondary | ICD-10-CM

## 2022-11-13 NOTE — Progress Notes (Signed)
Patient arrived for nurse visit to remove 2 sutures post- procedure CABG with Dr. Dorris Fetch 10/31/22.  Sutures removed with no signs/ symptoms of infection noted.  Incisions well approximated. Patient tolerated procedure well.  Patient/ family instructed to keep the incision sites clean and dry.  Patient/ family acknowledged instructions given.

## 2022-11-14 ENCOUNTER — Ambulatory Visit: Payer: Medicare Other | Admitting: Internal Medicine

## 2022-11-14 MED FILL — Magnesium Sulfate Inj 50%: INTRAMUSCULAR | Qty: 10 | Status: AC

## 2022-11-14 MED FILL — Potassium Chloride Inj 2 mEq/ML: INTRAVENOUS | Qty: 40 | Status: AC

## 2022-11-14 MED FILL — Heparin Sodium (Porcine) Inj 1000 Unit/ML: Qty: 1000 | Status: AC

## 2022-11-19 NOTE — Progress Notes (Signed)
Cardiology Office Note:    Date:  11/20/2022   ID:  ZEID RUOFF, DOB [birth year], MRN [id number]  PCP:  Donita Brooks, MD [facility] Cardiologist: Rollene Rotunda, MD   Reason for visit: Follow-up post CABG  History of Present Illness:    [name] is a 82 y.o. male with a hx of hypertension, hyperlipidemia, prostate cancer status post cryoablation in 2013, former tobacco use, remote resection of brain tumor.  He saw his PCP in April 2024 noticed some exertional chest discomfort.  He presented to the ED on April 29 with NSTEMI.  He had three-vessel disease on left heart catheterization.  He underwent CABG x 5 utilizing LIMA to LAD, SVG sequential to OM1, OM2, and OM3, and SVG to Acute Marginal.  He had postop paroxysmal atrial fibrillation.  When in normal sinus rhythm he tended to be bradycardic.  Metoprolol stopped and amiodarone continued.  Eliquis 2.5 mg twice daily added.  To note patient has Medicare A/B only. He has no part D so no drug coverage.  Today, he comes in with his son.  He states before his CABG, he was going to the gym 3 times a week.  He usually felt some chest tightness after bench pressing.  Then took a break off chest lifting but still felt chest tightness -that is when he sought medical attention.  Since his CABG, that chest tightness has resolved.  He still feels some chest soreness.  Incision has healed well.  He is walking without symptoms.  He denies palpitations.  No bleeding on Eliquis and aspirin.  His weight has gone down from 245 to 230 pounds.  He has just completed his Lasix prescription.  He no longer has leg swelling.  He now has a better diet.  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  He is not sure if he would gain too much from cardiac rehab given how active he is and his healthy lifestyle.      Past Medical History:  Diagnosis Date   Brain tumor (HCC)    Cancer [facility])    prostate  s/p cryoablation at [facility] 2/13, Gleason 7 of left lateral base   HLD (hyperlipidemia)    Hypertension     Past Surgical History:  Procedure Laterality Date   APPENDECTOMY     BRAIN SURGERY     CORONARY ARTERY BYPASS GRAFT N/A 10/31/2022   Procedure: CORONARY ARTERY BYPASS GRAFTING (CABG) X 5 USING LEFT INTERNAL MAMMARY ARTERY AND ENDOSCOPICALLY HARVESTED RIGHT GREATER SAPHENOUS VEIN;  Surgeon: [name], MD;  Location: [facility] OR;  Service: Open Heart Surgery;  Laterality: N/A;   LEFT HEART CATH AND CORONARY ANGIOGRAPHY N/A 10/29/2022   Procedure: LEFT HEART CATH AND CORONARY ANGIOGRAPHY;  Surgeon: Marykay Lex, MD;  Location: [facility] INVASIVE CV LAB;  Service: Cardiovascular;  Laterality: N/A;   TEE WITHOUT CARDIOVERSION N/A 10/31/2022   Procedure: TRANSESOPHAGEAL ECHOCARDIOGRAM;  Surgeon: [name], MD;  Location: [facility] OR;  Service: Open Heart Surgery;  Laterality: N/A;    Current Medications: Current Meds  Medication Sig   amiodarone (PACERONE) 200 MG tablet Take 2 tablets (400 mg total) by mouth 2 (two) times daily. For 3 days then decrease the dose to 200mg   (1 tablet) twice daily for 7 days then decrease the dose to 200mg  ONCE daily (Patient taking differently: Take 200 mg by mouth daily. Take 1 Tablet Daily.)  apixaban (ELIQUIS) 2.5 MG TABS tablet Take 1 tablet (2.5 mg total) by mouth 2 (two) times daily.   apixaban (ELIQUIS) 2.5 MG TABS tablet Take 1 tablet (2.5 mg total) by mouth 2 (two) times daily.   aspirin EC 81 MG tablet Take 81 mg by mouth at bedtime.   nitroGLYCERIN (NITROSTAT) 0.4 MG SL tablet Place 1 tablet (0.4 mg total) under the tongue every 5 (five) minutes as needed for chest pain.   rosuvastatin (CRESTOR) 40 MG tablet Take 1 tablet (40 mg total) by mouth daily.   [DISCONTINUED] apixaban (ELIQUIS) 5 MG TABS tablet Take 1 tablet (5 mg total) by mouth 2 (two) times daily.     Allergies:   Norvasc [amlodipine] and Red dye   Social History    Socioeconomic History   Marital status: Married    Spouse name: Not on file   Number of children: Not on file   Years of education: Not on file   Highest education level: Not on file  Occupational History   Not on file  Tobacco Use   Smoking status: Former   Smokeless tobacco: Never  Substance and Sexual Activity   Alcohol use: No    Alcohol/week: 0.0 standard drinks of alcohol   Drug use: No   Sexual activity: Not Currently  Other Topics Concern   Not on file  Social History Narrative   Not on file   Social Determinants of Health   Financial Resource Strain: Not on file  Food Insecurity: No Food Insecurity (11/05/2022)   Hunger Vital Sign    Worried About Running Out of Food in the Last Year: Never true    Ran Out of Food in the Last Year: Never true  Transportation Needs: No Transportation Needs (11/05/2022)   PRAPARE - Administrator, Civil Service (Medical): No    Lack of Transportation (Non-Medical): No  Physical Activity: Not on file  Stress: Not on file  Social Connections: Not on file     Family History: The patient's family history is not on file.  ROS:   Please see the history of present illness.     EKGs/Labs/Other Studies Reviewed:    EKG:  The ekg ordered today demonstrates normal sinus rhythm, heart rate 70.  Recent Labs: 10/27/2022: ALT 55; B Natriuretic Peptide 36.2; TSH 3.290 11/01/2022: Magnesium 2.7 11/04/2022: Hemoglobin 10.0; Platelets 198 11/06/2022: BUN 39; Creatinine, Ser 1.83; Potassium 4.0; Sodium 136   Recent Lipid Panel Lab Results  Component Value Date/Time   CHOL 111 10/28/2022 03:07 AM   TRIG 85 10/28/2022 03:07 AM   HDL 43 10/28/2022 03:07 AM   LDLCALC 51 10/28/2022 03:07 AM   LDLCALC 126 (H) 04/01/2022 12:43 PM    Physical Exam:    VS:  BP (!) 142/88   Pulse 70   Ht 5\' 9"  (1.753 m)   Wt 233 lb 12.8 oz (106.1 kg)   SpO2 (!) 5%   BMI 34.53 kg/m    No data found.   Wt Readings from Last 3 Encounters:  11/20/22  233 lb 12.8 oz (106.1 kg)  11/06/22 247 lb 1.6 oz (112.1 kg)  10/24/22 251 lb (113.9 kg)     GEN:  Well nourished, well developed in no acute distress HEENT: Normal NECK: No JVD; No carotid bruits CARDIAC: RRR, no murmurs, rubs, gallops RESPIRATORY:  Clear to auscultation without rales, wheezing or rhonchi  ABDOMEN: Soft, non-tender, non-distended MUSCULOSKELETAL: No edema SKIN: Warm and dry; sternal incision well-healed/dry NEUROLOGIC:  Alert and oriented PSYCHIATRIC:  Normal affect    ASSESSMENT AND PLAN   Coronary artery disease status post CABG, 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 Dorris Fetch. -EKG shows normal sinus rhythm today with heart rate 70. -Continue aspirin 81 mg daily.  Continue Crestor 40 mg daily. -Beta-blocker discontinued in the hospital secondary to bradycardia while on amiodarone.  Atenolol previously discontinued by his PCP secondary to bradycardia.  Postop atrial fibrillation, in NSR -Patient was unaware of his A-fib in the hospital -Will plan to continue amiodarone for 3 months postop.  Then would complete a 2 weeks Zio patch to look for asymptomatic A-fib.  If no known recurrence of A-fib, would consider discontinuing his Eliquis at the 6 month mark. -Cash price for 30 days of Eliquis is $713.00 at [employer] [facility].  Patient given another 2-week sample today and patient assistance form filled out.  Hypertension, BP slightly elevated today -Recommend monitoring blood pressure at home and bring BP log to next appointment. -He was previously on losartan 100 mg daily -this was discontinued secondary to AKI while inpatient. -Check BMET today. -Goal BP is <130/80.  Recommend DASH diet (high in vegetables, fruits, low-fat dairy products, whole grains, poultry, fish, and nuts and low in sweets, sugar-sweetened beverages, and red meats), salt restriction and increase physical  activity.  Hyperlipidemia with goal LDL less than 70 -LDL 51 in April 2024.  Continue Crestor 40 mg daily. -Discussed cholesterol lowering diets - Mediterranean diet, DASH diet, vegetarian diet, low-carbohydrate diet and avoidance of trans fats.  Discussed healthier choice substitutes.  Nuts, high-fiber foods, and fiber supplements may also improve lipids.    Disposition - Follow-up in 3 months -reassess blood pressure control and A-fib management.   Medication Adjustments/Labs and Tests Ordered: Current medicines are reviewed at length with the patient today.  Concerns regarding medicines are outlined above.  Orders Placed This Encounter  Procedures   Basic metabolic panel   EKG 12-Lead   Meds ordered this encounter  Medications   DISCONTD: apixaban (ELIQUIS) 5 MG TABS tablet    Sig: Take 1 tablet (5 mg total) by mouth 2 (two) times daily.    Dispense:  28 tablet    Refill:  0   apixaban (ELIQUIS) 2.5 MG TABS tablet    Sig: Take 1 tablet (2.5 mg total) by mouth 2 (two) times daily.    Dispense:  28 tablet    Refill:  0  (Given 2 week sample as above)  Patient Instructions  Medication Instructions:  Decrease Amiodarone to 200 mg ( Take 1 Tablet Daily). *If you need a refill on your cardiac medications before your next appointment, please call your pharmacy*   Lab Work: BMET  If you have labs (blood work) drawn today and your tests are completely normal, you will receive your results only by: MyChart Message (if you have MyChart) OR A paper copy in the mail If you have any lab test that is abnormal or we need to change your treatment, we will call you to review the results.   Testing/Procedures: No Testing   Follow-Up: At [facility], you and your health needs are our priority.  As part of our continuing mission to provide you with exceptional heart care, we have created designated Provider Care Teams.  These Care Teams include your primary Cardiologist  (physician) and Advanced Practice Providers (APPs -  Physician Assistants and Nurse Practitioners) who all work together to provide you with the care you need, when you need it.  We recommend signing up for the patient portal called "MyChart".  Sign up information is provided on this After Visit Summary.  MyChart is used to connect with patients for Virtual Visits (Telemedicine).  Patients are able to view lab/test results, encounter notes, upcoming appointments, etc.  Non-urgent messages can be sent to your provider as well.   To learn more about what you can do with MyChart, go to [email or web address].    Your next appointment:   3 month(s)  Provider:   Rollene Rotunda, MD     Signed, Cannon Kettle, PA-C  11/20/2022 12:38 PM    [facility]

## 2022-11-20 ENCOUNTER — Ambulatory Visit: Payer: Medicare Other | Attending: Physician Assistant | Admitting: Physician Assistant

## 2022-11-20 VITALS — BP 142/88 | HR 70 | Ht 69.0 in | Wt 233.8 lb

## 2022-11-20 DIAGNOSIS — E785 Hyperlipidemia, unspecified: Secondary | ICD-10-CM | POA: Diagnosis present

## 2022-11-20 DIAGNOSIS — Z951 Presence of aortocoronary bypass graft: Secondary | ICD-10-CM | POA: Diagnosis present

## 2022-11-20 DIAGNOSIS — I4891 Unspecified atrial fibrillation: Secondary | ICD-10-CM | POA: Diagnosis present

## 2022-11-20 DIAGNOSIS — I2510 Atherosclerotic heart disease of native coronary artery without angina pectoris: Secondary | ICD-10-CM

## 2022-11-20 DIAGNOSIS — I9789 Other postprocedural complications and disorders of the circulatory system, not elsewhere classified: Secondary | ICD-10-CM

## 2022-11-20 DIAGNOSIS — I10 Essential (primary) hypertension: Secondary | ICD-10-CM

## 2022-11-20 MED ORDER — APIXABAN 2.5 MG PO TABS: 2.5000 mg | ORAL_TABLET | Freq: Two times a day (BID) | ORAL | 0 refills | Status: DC

## 2022-11-20 MED ORDER — APIXABAN 5 MG PO TABS: 5.0000 mg | ORAL_TABLET | Freq: Two times a day (BID) | ORAL | 0 refills | Status: DC

## 2022-11-20 NOTE — Patient Instructions (Signed)
Medication Instructions:  Decrease Amiodarone to 200 mg ( Take 1 Tablet Daily). *If you need a refill on your cardiac medications before your next appointment, please call your pharmacy*   Lab Work: BMET  If you have labs (blood work) drawn today and your tests are completely normal, you will receive your results only by: MyChart Message (if you have MyChart) OR A paper copy in the mail If you have any lab test that is abnormal or we need to change your treatment, we will call you to review the results.   Testing/Procedures: No Testing   Follow-Up: At [facility], you and your health needs are our priority.  As part of our continuing mission to provide you with exceptional heart care, we have created designated Provider Care Teams.  These Care Teams include your primary Cardiologist (physician) and Advanced Practice Providers (APPs -  Physician Assistants and Nurse Practitioners) who all work together to provide you with the care you need, when you need it.  We recommend signing up for the patient portal called "MyChart".  Sign up information is provided on this After Visit Summary.  MyChart is used to connect with patients for Virtual Visits (Telemedicine).  Patients are able to view lab/test results, encounter notes, upcoming appointments, etc.  Non-urgent messages can be sent to your provider as well.   To learn more about what you can do with MyChart, go to [email or web address].    Your next appointment:   3 month(s)  Provider:   Rollene Rotunda, MD

## 2022-11-21 LAB — BASIC METABOLIC PANEL
BUN/Creatinine Ratio: 15 (ref 10–24)
BUN: 28 mg/dL — ABNORMAL HIGH (ref 8–27)
CO2: 26 mmol/L (ref 20–29)
Calcium: 10 mg/dL (ref 8.6–10.2)
Chloride: 98 mmol/L (ref 96–106)
Creatinine, Ser: 1.84 mg/dL — ABNORMAL HIGH (ref 0.76–1.27)
Glucose: 117 mg/dL — ABNORMAL HIGH (ref 70–99)
Potassium: 5.6 mmol/L — ABNORMAL HIGH (ref 3.5–5.2)
Sodium: 139 mmol/L (ref 134–144)
eGFR: 37 mL/min/{1.73_m2} — ABNORMAL LOW (ref >59)

## 2022-11-26 ENCOUNTER — Telehealth: Payer: Self-pay

## 2022-11-26 NOTE — Telephone Encounter (Addendum)
Called patient regarding results. Left message for patient to call office.[name]----- Message from Cannon Kettle, PA-C sent at 11/25/2022 11:22 AM EDT ----- Kidney function stable from hospital discharge - though not back down to pre-hospital baseline of Cr ~1.3.  Potassium level elevated. -- following course of lasix and potassium supplement on hospital discharge, course completed.  Recommendations: I think your potassium will return to normal now that you are off potassium supplement.  Recommend having BMET done at your 12/02/22 appointment.  Also bring your BP log to your appointment for review.

## 2022-12-01 ENCOUNTER — Other Ambulatory Visit: Payer: Self-pay | Admitting: Thoracic Surgery (Cardiothoracic Vascular Surgery)

## 2022-12-01 DIAGNOSIS — Z951 Presence of aortocoronary bypass graft: Secondary | ICD-10-CM

## 2022-12-02 ENCOUNTER — Ambulatory Visit: Payer: Self-pay | Admitting: Thoracic Surgery (Cardiothoracic Vascular Surgery)

## 2022-12-02 ENCOUNTER — Encounter: Payer: Self-pay | Admitting: Physician Assistant

## 2022-12-02 ENCOUNTER — Telehealth: Payer: Self-pay | Admitting: Cardiology

## 2022-12-02 ENCOUNTER — Ambulatory Visit
Admission: RE | Admit: 2022-12-02 | Discharge: 2022-12-02 | Disposition: A | Discharge status: Home / Self Care | Payer: Medicare Other | Source: Ambulatory Visit | Attending: Thoracic Surgery (Cardiothoracic Vascular Surgery) | Admitting: Thoracic Surgery (Cardiothoracic Vascular Surgery)

## 2022-12-02 ENCOUNTER — Ambulatory Visit (INDEPENDENT_AMBULATORY_CARE_PROVIDER_SITE_OTHER): Payer: Self-pay | Admitting: Physician Assistant

## 2022-12-02 VITALS — BP 140/88 | HR 74 | Resp 20 | Ht 69.0 in | Wt 230.0 lb

## 2022-12-02 DIAGNOSIS — Z951 Presence of aortocoronary bypass graft: Secondary | ICD-10-CM

## 2022-12-02 NOTE — Progress Notes (Signed)
[address]       [name] [address]             [phone]     HPI: [name] is an 82 year old gentleman with a past medical history of NSTEMI, hypertension, dyslipidemia, prostate cancer status (s/p cryoablation in 2013) and former smoker. The patient returns for routine postoperative follow-up having undergone CABG x 5 (LIMA to LAD, SVG sequential to OM1, OM2, and OM3, and SVG to Acute Marginal) by Dr. Dorris Fetch on 10/31/22. The patient's early postoperative recovery while in the hospital was notable for atrial fibrillation with RVR that was treated with Amiodarone and Eliquis, Lopressor was discontinued due to bradycardia and home Losartan was discontinued due to AKI that resolved while in the hospital. He otherwise had a routine postoperative hospital course and was discharged on 11/06/22. He saw cardiology on 11/20/22 and was noted to be doing well, he was hypertensive and was told to record home blood pressures, antihypertensive medication was not started as he will be on Amiodarone 200mg  daily for 3 months postop.   Today the patient reports no chest pain, sob, dizziness, LOC. He states he gets some soreness in his right ribcage and around the sternal incision but never used pain medication. He states he continues to use his incentive spirometer and is ambulating well around the house. He would like to get into the gym to walk on the treadmill. 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   Current Outpatient Medications  Medication Sig Dispense Refill   amiodarone (PACERONE) 200 MG tablet Take 2 tablets (400 mg total) by mouth 2 (two) times daily. For 3 days then decrease the dose to 200mg   (1 tablet) twice daily for 7 days then decrease the dose to 200mg  ONCE daily (Patient taking differently: Take 200 mg by mouth daily. Take 1 Tablet Daily.) 60 tablet 2   apixaban (ELIQUIS) 2.5 MG TABS tablet Take 1 tablet (2.5 mg total) by  mouth 2 (two) times daily. 60 tablet 2   apixaban (ELIQUIS) 2.5 MG TABS tablet Take 1 tablet (2.5 mg total) by mouth 2 (two) times daily. 28 tablet 0   aspirin EC 81 MG tablet Take 81 mg by mouth at bedtime.     chlorpheniramine-HYDROcodone (TUSSIONEX) 10-8 MG/5ML Take 5 mLs by mouth every 12 (twelve) hours as needed for cough. (Patient not taking: Reported on 11/20/2022) 115 mL 0   guaiFENesin (MUCINEX) 600 MG 12 hr tablet Take 2 tablets (1,200 mg total) by mouth 2 (two) times daily. (Patient not taking: Reported on 11/20/2022) 30 tablet 1   nitroGLYCERIN (NITROSTAT) 0.4 MG SL tablet Place 1 tablet (0.4 mg total) under the tongue every 5 (five) minutes as needed for chest pain. 50 tablet 3   rosuvastatin (CRESTOR) 40 MG tablet Take 1 tablet (40 mg total) by mouth daily. 30 tablet 2   No current facility-administered medications for this visit.   Vitals: Today's Vitals   12/02/22 1423  BP: (!) 140/88  Pulse: 74  Resp: 20  SpO2: 96%  Weight: 230 lb (104.3 kg)  Height: 5\' 9"  (1.753 m)   Body mass index is 33.97 kg/m.  Physical Exam: General: No acute distress Neuro: Grossly intact CV: Regular rate and rhythm, no murmur Pulm: Clear to auscultation GI: +BS, nontender Extremities: No edema Wound: Well healed, no erythema or sign of infection  Diagnostic Tests: CLINICAL DATA:  Post CABG   EXAM:  CHEST - 2 VIEW   COMPARISON:  11/04/2022   FINDINGS: Bibasilar linear densities, previously seen bilateral effusions no longer visualized. Linear bibasilar scarring or atelectasis. Prior CABG. Heart is normal size. Mediastinal contours within normal limits. No acute bony abnormality.   IMPRESSION: No visible effusions.  Bibasilar atelectasis or scarring.     Electronically Signed   By: Charlett Nose M.D.   On: 12/02/2022 14:27  Assessment and Plan: Mr. [name] is about 1 month out from CABG x 5 and is doing remarkably well. He reports no chest pain, chest tightness or SOB, and has  not required pain medication. He is hypertensive in clinic today but has not been restarted on antihypertensives due to bradycardia while on Amiodarone. We will continue current medications and pt will continue to follow up with cardiology. CXR shows some bibasilar atelectasis, discussed continued use of IS and increased ambulation as tolerated. I recommended cardiac rehab and cleared him for this today but he states he will have to think about it since he is already so active. I cleared him to drive today since he is no longer on pain medication. I discussed continued sternal precautions for another month and then he can slowly increase activity as tolerated. Pt will return to clinic as needed.   Jenny Reichmann, PA-C [facility] [phone]

## 2022-12-02 NOTE — Patient Instructions (Signed)
You are encouraged to enroll and participate in the outpatient cardiac rehab program beginning as soon as practical.  You may return to driving an automobile as long as you are no longer requiring oral narcotic pain relievers during the daytime.  It would be wise to start driving only short distances during the daylight and gradually increase from there as you feel comfortable.  Continue 10-15lbs weight restriction for 1 more month. Continue to avoid any heavy lifting or strenuous use of your arms or shoulders for at least a total of three months from the time of surgery.  After three months you may gradually increase how much you lift or otherwise use your arms or chest as tolerated, with limits based upon whether or not activities lead to the return of significant discomfort.

## 2022-12-02 NOTE — Telephone Encounter (Signed)
Paper Work Dropped Off: Patient Assistance    Date:12/02/2022   Location of paper: handed to [employer] covering, Myna Hidalgo

## 2022-12-02 NOTE — Telephone Encounter (Signed)
Paperwork put in provider box, provider not in office until Thursday 6/6 per CMA (SG).

## 2022-12-04 ENCOUNTER — Other Ambulatory Visit (HOSPITAL_COMMUNITY): Payer: Self-pay

## 2022-12-04 ENCOUNTER — Encounter (HOSPITAL_COMMUNITY): Payer: Self-pay

## 2022-12-04 ENCOUNTER — Telehealth (HOSPITAL_COMMUNITY): Payer: Self-pay

## 2022-12-04 NOTE — Telephone Encounter (Signed)
Attempted to call patient in regards to Cardiac Rehab - LM on VM Mailed letter 

## 2022-12-08 ENCOUNTER — Other Ambulatory Visit (HOSPITAL_COMMUNITY): Payer: Self-pay

## 2022-12-10 ENCOUNTER — Other Ambulatory Visit (HOSPITAL_COMMUNITY): Payer: Self-pay

## 2022-12-10 ENCOUNTER — Telehealth: Payer: Self-pay

## 2022-12-10 DIAGNOSIS — I10 Essential (primary) hypertension: Secondary | ICD-10-CM

## 2022-12-10 NOTE — Telephone Encounter (Addendum)
Letter mailed to patient regarding results. Lab order for repeat BMET mailed to patient 12/10/2022.----- Message from Cannon Kettle, PA-C sent at 11/25/2022 11:22 AM EDT ----- Kidney function stable from hospital discharge - though not back down to pre-hospital baseline of Cr ~1.3.  Potassium level elevated. -- following course of lasix and potassium supplement on hospital discharge, course completed.  Recommendations: I think your potassium will return to normal now that you are off potassium supplement.  Recommend having BMET done at your 12/02/22 appointment.  Also bring your BP log to your appointment for review.

## 2022-12-11 ENCOUNTER — Telehealth: Payer: Self-pay | Admitting: Pharmacist

## 2022-12-11 DIAGNOSIS — I4891 Unspecified atrial fibrillation: Secondary | ICD-10-CM

## 2022-12-11 MED ORDER — APIXABAN 2.5 MG PO TABS
2.5000 mg | ORAL_TABLET | Freq: Two times a day (BID) | ORAL | 0 refills | Status: DC
Start: 2022-12-11 — End: 2023-03-06

## 2022-12-11 NOTE — Telephone Encounter (Signed)
Eliquis samples given

## 2022-12-16 ENCOUNTER — Other Ambulatory Visit (HOSPITAL_COMMUNITY): Payer: Self-pay

## 2022-12-30 ENCOUNTER — Telehealth: Payer: Self-pay

## 2022-12-30 NOTE — Telephone Encounter (Signed)
Called patient to advise approved for patient assistance  for Eliquis from  12/11/2022 until 06/30/2023. Left message for patient to call office.

## 2022-12-31 ENCOUNTER — Telehealth: Payer: Self-pay | Admitting: Cardiology

## 2022-12-31 NOTE — Telephone Encounter (Signed)
Pt c/o BP issue: STAT if pt c/o blurred vision, one-sided weakness or slurred speech  1. What are your last 5 BP readings?  174/76 170/75  2. Are you having any other symptoms (ex. Dizziness, headache, blurred vision, passed out)? Extreme Dizziness    3. What is your BP issue? Patient states that nurse took BP reading and states that his BP has never been this high.   States he had a little lunch and feels better currently, but earlier today felt like the room was spinning.

## 2022-12-31 NOTE — Telephone Encounter (Signed)
Patient was returning call. Please advise ?

## 2022-12-31 NOTE — Telephone Encounter (Signed)
Patient had elevated BP today.  States this morning when woke up and "room was spinning".  Stayed dizzy most of day,. But now just a little lightheaded. Neighbor is a Engineer, civil (consulting) and came over to check his BP.  174/76 With BP machine 170/75 Manual BP No illness, no increased sodium, no stressors.  Amiodarone 200 mg Daily Eliquis 2.5 mg  Advised to have her write down pressure tonight and if able to check at least twice tomorrow.  He will call Friday with readings.  IF it is high tonight the nurse friend will decide should he call on call or not.  If he has another episode of elevated BP with SOB or chest pain, he will go to ED.  Any further recommendations. Please advise

## 2023-01-02 NOTE — Telephone Encounter (Signed)
Call to patient. LVM to please call with BP readings.

## 2023-01-02 NOTE — Telephone Encounter (Signed)
Patient called with BP readings and will continue to monitor BP.

## 2023-01-02 NOTE — Telephone Encounter (Signed)
Patient returned RN's call and reported the following BP readings:  Wed  - 180/86   Thur - 160/80 (after BP med at 7:30 pm)

## 2023-01-05 NOTE — Telephone Encounter (Signed)
Patient is returning call. Requesting return call.  

## 2023-01-05 NOTE — Telephone Encounter (Signed)
Left voicemail for patient to return call to office. 

## 2023-01-05 NOTE — Telephone Encounter (Signed)
Rollene Rotunda, MD  [name], Vernon R, [place] hours ago (2:53 PM)   I would like to try hydralazine 20 mg bid since he does not tolerate Cozaar and Norvasc.     Patient has a visit on 7/10 with Victorino Dike PA -- advised to bring home BP cuff and readings.  He would like to wait to see PA before trying new meds.   Also, up until today he was only taking eliquis once daily in the morning. He has since started taking twice daily. He is approved for assistance for this but for simplicity of dosing, Xarelto may be an option.

## 2023-01-06 NOTE — Progress Notes (Unsigned)
Cardiology Office Note   Date:  01/06/2023  ID:  [name], DOB [birth year], MRN [id number] PCP:  Donita Brooks, MD [facility] Cardiologist: Rollene Rotunda, MD  Reason for visit: Hypertension  History of Present Illness    [name] is a 82 y.o. male with a hx of hypertension, hyperlipidemia, prostate cancer status post cryoablation in 2013, former tobacco use, remote resection of brain tumor.  He saw his PCP in April 2024 noticed some exertional chest discomfort.  He presented to the ED on April 29 with NSTEMI.  He had three-vessel disease on left heart catheterization.  He underwent CABG x 5 utilizing LIMA to LAD, SVG sequential to OM1, OM2, and OM3, and SVG to Acute Marginal.  He had postop paroxysmal atrial fibrillation.  When in normal sinus rhythm he tended to be bradycardic.  Metoprolol stopped and amiodarone continued.  Eliquis 2.5 mg twice daily added.  To note patient has Medicare A/B only. He has no part D so no drug coverage.   I saw the patient on Nov 20, 2022 with his son.  His exertional chest pain (noticed when he was bench pressing) had resolved post CABG.  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  BP at appointment 142/88.  Patient called our office in July 3 with elevated blood pressure up to 170s/70s with dizziness.  Since patient had not tolerated Cozaar and Norvasc in the past, Dr. Antoine Poche recommended trying hydralazine 20 mg twice daily.  Patient wanted to discuss before starting new medication.  ?Obtaining Eliquis -- ?order Zio patch now  Amlodipine 5mg  vs. Hydralazine  BMET  Today, ***   Coronary artery disease status post CABG, no angina -NSTEMI s/p CABG x 5 utilizing LIMA to LAD, SVG sequential to OM1, OM2, and OM3, and SVG to Acute Marginal 10/31/2022 with Dr. Dorris Fetch -***  -EKG shows normal sinus rhythm today with heart rate 70. -Continue aspirin 81 mg daily.  Continue Crestor 40 mg  daily. -Beta-blocker discontinued in the hospital secondary to bradycardia while on amiodarone.  Atenolol previously discontinued by his PCP secondary to bradycardia.   Postop atrial fibrillation, in NSR -Patient was unaware of his A-fib in the hospital -***  -Will plan to continue amiodarone for 3 months postop.  Then would complete a 2 weeks Zio patch to look for asymptomatic A-fib.  If no known recurrence of A-fib, would consider discontinuing his Eliquis at the 6 month mark. -Cash price for 30 days of Eliquis is $713.00 at [employer].  Patient given another 2-week sample today and patient assistance form filled out.   Hypertension, BP slightly elevated today -Listed leg swelling to amlodipine in 10/2012 -He was previously on losartan 100 mg daily -this was discontinued secondary to AKI while inpatient.  Baseline Cr ~1-1.3 prior to CABG, went to 1.9.   -***  -Recommend monitoring blood pressure at home and bring BP log to next appointment. -Check BMET today. -Goal BP is <130/80.  Recommend DASH diet (high in vegetables, fruits, low-fat dairy products, whole grains, poultry, fish, and nuts and low in sweets, sugar-sweetened beverages, and red meats), salt restriction and increase physical activity.   Hyperlipidemia with goal LDL less than 70 -LDL 51 in April 2024.  Continue Crestor 40 mg daily. -Discussed cholesterol lowering diets - Mediterranean diet, DASH diet, vegetarian diet, low-carbohydrate diet and avoidance of trans fats.  Discussed healthier choice substitutes.  Nuts, high-fiber foods, and fiber supplements may also improve  lipids.     Disposition - Follow-up in 3 months -reassess blood pressure control and A-fib management.    Objective / Physical Exam   EKG today: ***  Vital signs:  There were no vitals taken for this visit.    GEN: No acute distress NECK: No carotid bruits CARDIAC: ***RRR, no murmurs RESPIRATORY:  Clear to auscultation without rales, wheezing or rhonchi   EXTREMITIES: No edema  Assessment and Plan   ***   {Are you ordering a CV Procedure (e.g. stress test, cath, DCCV, TEE, etc)?   Press F2        :[id number]}    Signed, Bernette Mayers  01/06/2023 [facility]

## 2023-01-07 ENCOUNTER — Ambulatory Visit: Payer: Medicare Other | Attending: Physician Assistant | Admitting: Physician Assistant

## 2023-01-07 ENCOUNTER — Telehealth (HOSPITAL_COMMUNITY): Payer: Self-pay

## 2023-01-07 ENCOUNTER — Encounter: Payer: Self-pay | Admitting: Physician Assistant

## 2023-01-07 VITALS — BP 152/84 | HR 74 | Ht 69.0 in | Wt 230.4 lb

## 2023-01-07 DIAGNOSIS — I2510 Atherosclerotic heart disease of native coronary artery without angina pectoris: Secondary | ICD-10-CM | POA: Diagnosis present

## 2023-01-07 DIAGNOSIS — I9789 Other postprocedural complications and disorders of the circulatory system, not elsewhere classified: Secondary | ICD-10-CM | POA: Diagnosis present

## 2023-01-07 DIAGNOSIS — I10 Essential (primary) hypertension: Secondary | ICD-10-CM

## 2023-01-07 DIAGNOSIS — I4891 Unspecified atrial fibrillation: Secondary | ICD-10-CM

## 2023-01-07 DIAGNOSIS — Z951 Presence of aortocoronary bypass graft: Secondary | ICD-10-CM

## 2023-01-07 MED ORDER — HYDRALAZINE HCL 25 MG PO TABS: ORAL_TABLET | ORAL | 3 refills | Status: DC

## 2023-01-07 NOTE — Patient Instructions (Signed)
Medication Instructions:  Stop taking Amiodarone Start taking Hydralazine 25 mg twice a day Continue all other medications *If you need a refill on your cardiac medications before your next appointment, please call your pharmacy*   Lab Work: Bmet today   Testing/Procedures: None ordered   Follow-Up: At [facility], you and your health needs are our priority.  As part of our continuing mission to provide you with exceptional heart care, we have created designated Provider Care Teams.  These Care Teams include your primary Cardiologist (physician) and Advanced Practice Providers (APPs -  Physician Assistants and Nurse Practitioners) who all work together to provide you with the care you need, when you need it.  We recommend signing up for the patient portal called "MyChart".  Sign up information is provided on this After Visit Summary.  MyChart is used to connect with patients for Virtual Visits (Telemedicine).  Patients are able to view lab/test results, encounter notes, upcoming appointments, etc.  Non-urgent messages can be sent to your provider as well.   To learn more about what you can do with MyChart, go to [email or web address].    Your next appointment:  Friday 8/30 at 2:30 pm    Provider:  Dr.Hochrein   Will will follow up a blood test to check kidney function before considering restarting Losartan     Omron blood pressure monitor Check blood pressure every other day.Keep a diary and bring reading to next appointment

## 2023-01-07 NOTE — Telephone Encounter (Signed)
Pt is not interested in the cardiac rehab program. Closed referral 

## 2023-01-08 LAB — BASIC METABOLIC PANEL
BUN/Creatinine Ratio: 16 (ref 10–24)
BUN: 23 mg/dL (ref 8–27)
CO2: 23 mmol/L (ref 20–29)
Calcium: 9.5 mg/dL (ref 8.6–10.2)
Chloride: 103 mmol/L (ref 96–106)
Creatinine, Ser: 1.48 mg/dL — ABNORMAL HIGH (ref 0.76–1.27)
Glucose: 91 mg/dL (ref 70–99)
Potassium: 4.6 mmol/L (ref 3.5–5.2)
Sodium: 143 mmol/L (ref 134–144)
eGFR: 48 mL/min/{1.73_m2} — ABNORMAL LOW (ref >59)

## 2023-01-16 ENCOUNTER — Telehealth: Payer: Self-pay

## 2023-01-16 NOTE — Telephone Encounter (Addendum)
Called patient regarding results. Left message for patient to call office.----- Message from Cannon Kettle sent at 01/13/2023 10:12 PM EDT ----- Good news -- your kidney function has improved since hospital discharge.  Creatinine 1.48 (compared to last check of 1.84).  We are getting closer to your baseline creatinine around 1-1.3.

## 2023-02-04 ENCOUNTER — Telehealth: Payer: Self-pay

## 2023-02-04 NOTE — Telephone Encounter (Addendum)
Called patient regarding results. Left message for patient to call office. Letter mailed 02/04/2023----- Message from Cannon Kettle sent at 01/13/2023 10:12 PM EDT ----- Good news -- your kidney function has improved since hospital discharge.  Creatinine 1.48 (compared to last check of 1.84).  We are getting closer to your baseline creatinine around 1-1.3.

## 2023-02-27 ENCOUNTER — Ambulatory Visit: Payer: Medicare Other | Admitting: Cardiology

## 2023-03-03 ENCOUNTER — Other Ambulatory Visit: Payer: Self-pay | Admitting: Physician Assistant

## 2023-03-05 DIAGNOSIS — I2510 Atherosclerotic heart disease of native coronary artery without angina pectoris: Secondary | ICD-10-CM | POA: Insufficient documentation

## 2023-03-05 DIAGNOSIS — I4891 Unspecified atrial fibrillation: Secondary | ICD-10-CM | POA: Insufficient documentation

## 2023-03-05 NOTE — Progress Notes (Signed)
  Cardiology Office Note:   Date:  03/06/2023  ID:  [name], DOB 29-Oct-1940, MRN [id number] PCP: Vernon Brooks, MD  [facility] Prov[facility] Cardiologist:  Rollene Rotunda, MD {   History of Present Illness:   [name] is a 82 y.o. male  with CAD.    He presented to the ED on April 29 with NSTEMI.  He had three-vessel disease on left heart catheterization.  He underwent CABG x 5 utilizing LIMA to LAD, SVG sequential to OM1, OM2, and OM3, and SVG to OM.  He had post op atrial fib.  He has had elevated BP at home.     He returns for follow-up.  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  His youngest son died in a car accident in April 08, 2009.  The patient denies any new symptoms such as chest discomfort, neck or arm discomfort. There has been no new shortness of breath, PND or orthopnea. There have been no reported palpitations, presyncope or syncope.   ROS: As stated in the HPI and negative for all other systems.  Studies Reviewed:    EKG:   NA  Risk Assessment/Calculations:           Physical Exam:   VS:  BP (!) 160/86 (BP Location: Left Arm, Patient Position: Sitting, Cuff Size: Large)   Pulse 89   Ht 5' 9.5" (1.765 m)   Wt 225 lb 3.2 oz (102.2 kg)   SpO2 96%   BMI 32.78 kg/m    Wt Readings from Last 3 Encounters:  03/06/23 225 lb 3.2 oz (102.2 kg)  01/07/23 230 lb 6.4 oz (104.5 kg)  12/02/22 230 lb (104.3 kg)     GEN: Well nourished, well developed in no acute distress NECK: No JVD; No carotid bruits CARDIAC: RRR, no murmurs, rubs, gallops RESPIRATORY:  Clear to auscultation without rales, wheezing or rhonchi  ABDOMEN: Soft, non-tender, non-distended EXTREMITIES:  No edema; No deformity   ASSESSMENT AND PLAN:   Coronary artery disease status post CABG:  The patient has no new sypmtoms.  No further cardiovascular testing is  indicated.  We will continue with aggressive risk reduction and meds as listed.  Postop atrial fibrillation: He has had no recurrent fibrillation.  He can stop his Eliquis and start aspirin once a day.  Hypertension: His blood pressure is at target.  I would not want to be much higher than this but hopefully as he loses weight.  This will continue to come down.  He will let me know if not.  He is on hydralazine which he tolerates so I will discontinue this.  Hyperlipidemia : LDL was 51 with an HDL of 43.  No change in therapy.       Follow up with me in six months.   Signed, Rollene Rotunda, MD

## 2023-03-06 ENCOUNTER — Encounter: Payer: Self-pay | Admitting: Cardiology

## 2023-03-06 ENCOUNTER — Ambulatory Visit: Payer: Medicare Other | Attending: Cardiology | Admitting: Cardiology

## 2023-03-06 VITALS — BP 160/86 | HR 89 | Ht 69.5 in | Wt 225.2 lb

## 2023-03-06 DIAGNOSIS — I4891 Unspecified atrial fibrillation: Secondary | ICD-10-CM | POA: Diagnosis present

## 2023-03-06 DIAGNOSIS — E785 Hyperlipidemia, unspecified: Secondary | ICD-10-CM

## 2023-03-06 DIAGNOSIS — I9789 Other postprocedural complications and disorders of the circulatory system, not elsewhere classified: Secondary | ICD-10-CM | POA: Diagnosis present

## 2023-03-06 DIAGNOSIS — I2510 Atherosclerotic heart disease of native coronary artery without angina pectoris: Secondary | ICD-10-CM

## 2023-03-06 DIAGNOSIS — I10 Essential (primary) hypertension: Secondary | ICD-10-CM | POA: Diagnosis present

## 2023-03-06 MED ORDER — ROSUVASTATIN CALCIUM 40 MG PO TABS: 40.0000 mg | ORAL_TABLET | Freq: Every day | ORAL | 2 refills | Status: DC

## 2023-03-06 NOTE — Patient Instructions (Addendum)
Medication Instructions:  Your physician has recommended you make the following change in your medication:  - STOP taking Eliquis - Start taking baby aspirin 81 mg, once daily.  - Refilled Crestor 40 mg today.   *If you need a refill on your cardiac medications before your next appointment, please call your pharmacy*      Follow-Up: At [facility], you and your health needs are our priority.  As part of our continuing mission to provide you with exceptional heart care, we have created designated Provider Care Teams.  These Care Teams include your primary Cardiologist (physician) and Advanced Practice Providers (APPs -  Physician Assistants and Nurse Practitioners) who all work together to provide you with the care you need, when you need it.  We recommend signing up for the patient portal called "MyChart".  Sign up information is provided on this After Visit Summary.  MyChart is used to connect with patients for Virtual Visits (Telemedicine).  Patients are able to view lab/test results, encounter notes, upcoming appointments, etc.  Non-urgent messages can be sent to your provider as well.   To learn more about what you can do with MyChart, go to [email or web address].    Your next appointment:   6 month(s)  The format for your next appointment:   In Person  Provider:   Rollene Rotunda, MD

## 2023-05-11 ENCOUNTER — Other Ambulatory Visit: Payer: Self-pay | Admitting: Physician Assistant

## 2023-06-08 ENCOUNTER — Other Ambulatory Visit: Payer: Self-pay | Admitting: Cardiology

## 2023-07-03 ENCOUNTER — Encounter: Payer: Self-pay | Admitting: Family Medicine

## 2023-07-03 ENCOUNTER — Ambulatory Visit (INDEPENDENT_AMBULATORY_CARE_PROVIDER_SITE_OTHER): Payer: Medicare Other | Admitting: Family Medicine

## 2023-07-03 VITALS — BP 132/74 | HR 60 | Temp 100.0°F | Ht 69.5 in | Wt 220.6 lb

## 2023-07-03 DIAGNOSIS — J069 Acute upper respiratory infection, unspecified: Secondary | ICD-10-CM

## 2023-07-03 MED ORDER — PREDNISONE 20 MG PO TABS: ORAL_TABLET | ORAL | 0 refills | Status: DC

## 2023-07-03 MED ORDER — AZITHROMYCIN 250 MG PO TABS: ORAL_TABLET | ORAL | 0 refills | Status: DC

## 2023-07-03 NOTE — Progress Notes (Signed)
 Subjective:    Patient ID: [name], male    DOB: [birth year], 83 y.o.   MRN: [id number] Patient's entire family was sick over Christmas.  Symptoms include head congestion with rhinorrhea and cough.  Patient states that he has now developed the symptoms as well for the last 3 days.  He reports head congestion.  He reports cough.  Today on exam he has diminished breath sounds bilaterally with expiratory wheezing.  He denies any history of asthma.  Cough is nonproductive.  He denies any purulent sputum.  He does have a low-grade temperature to 100.  He denies any facial pain.  He denies any maxillary sinus pain.  He denies any nausea or vomiting.  He denies any body aches. Past Medical History:  Diagnosis Date   Brain tumor (HCC)    Cancer [facility])    prostate s/p cryoablation at [facility] 2/13, Gleason 7 of left lateral base   HLD (hyperlipidemia)    Hypertension    Past Surgical History:  Procedure Laterality Date   APPENDECTOMY     BRAIN SURGERY     CORONARY ARTERY BYPASS GRAFT N/A 10/31/2022   Procedure: CORONARY ARTERY BYPASS GRAFTING (CABG) X 5 USING LEFT INTERNAL MAMMARY ARTERY AND ENDOSCOPICALLY HARVESTED RIGHT GREATER SAPHENOUS VEIN;  Surgeon: Kerrin Elspeth BROCKS, MD;  Location: [facility] OR;  Service: Open Heart Surgery;  Laterality: N/A;   LEFT HEART CATH AND CORONARY ANGIOGRAPHY N/A 10/29/2022   Procedure: LEFT HEART CATH AND CORONARY ANGIOGRAPHY;  Surgeon: Anner Alm ORN, MD;  Location: [facility] INVASIVE CV LAB;  Service: Cardiovascular;  Laterality: N/A;   TEE WITHOUT CARDIOVERSION N/A 10/31/2022   Procedure: TRANSESOPHAGEAL ECHOCARDIOGRAM;  Surgeon: Kerrin Elspeth BROCKS, MD;  Location: [facility] OR;  Service: Open Heart Surgery;  Laterality: N/A;   Current Outpatient Medications on File Prior to Visit  Medication Sig Dispense Refill   aspirin  EC 81 MG tablet Take 81 mg by mouth at bedtime.     hydrALAZINE  (APRESOLINE ) 25 MG tablet Take 1 tablet by mouth twice daily 180 tablet 2   rosuvastatin   (CRESTOR ) 40 MG tablet Take 1 tablet by mouth once daily 30 tablet 0   No current facility-administered medications on file prior to visit.   Allergies  Allergen Reactions   Norvasc [Amlodipine] Swelling    Leg swelling   Red Dye #40 (Allura Red) Hives   Social History   Socioeconomic History   Marital status: Married    Spouse name: Not on file   Number of children: Not on file   Years of education: Not on file   Highest education level: Not on file  Occupational History   Not on file  Tobacco Use   Smoking status: Former   Smokeless tobacco: Never  Substance and Sexual Activity   Alcohol use: No    Alcohol/week: 0.0 standard drinks of alcohol   Drug use: No   Sexual activity: Not Currently  Other Topics Concern   Not on file  Social History Narrative   Not on file   Social Drivers of Health   Financial Resource Strain: Not on file  Food Insecurity: No Food Insecurity (11/05/2022)   Hunger Vital Sign    Worried About Running Out of Food in the Last Year: Never true    Ran Out of Food in the Last Year: Never true  Transportation Needs: No Transportation Needs (11/05/2022)   PRAPARE - Administrator, Civil Service (Medical): No    Lack of Transportation (Non-Medical):  No  Physical Activity: Not on file  Stress: Not on file  Social Connections: Not on file  Intimate Partner Violence: Not At Risk (11/05/2022)   Humiliation, Afraid, Rape, and Kick questionnaire    Fear of Current or Ex-Partner: No    Emotionally Abused: No    Physically Abused: No    Sexually Abused: No      Review of Systems  All other systems reviewed and are negative.      Objective:   Physical Exam Constitutional:      General: He is not in acute distress.    Appearance: Normal appearance. He is normal weight. He is not ill-appearing or toxic-appearing.  HENT:     Right Ear: Tympanic membrane and ear canal normal.     Left Ear: Tympanic membrane and ear canal normal.      Nose: Congestion and rhinorrhea present.     Mouth/Throat:     Mouth: Mucous membranes are moist.     Pharynx: No oropharyngeal exudate or posterior oropharyngeal erythema.  Eyes:     Conjunctiva/sclera: Conjunctivae normal.  Cardiovascular:     Rate and Rhythm: Normal rate and regular rhythm.     Heart sounds: Normal heart sounds. No murmur heard.    No friction rub. No gallop.  Pulmonary:     Effort: Pulmonary effort is normal. No respiratory distress.     Breath sounds: Decreased air movement present. Wheezing present. No rhonchi or rales.  Abdominal:     General: Bowel sounds are normal. There is no distension.     Palpations: Abdomen is soft.     Tenderness: There is no abdominal tenderness. There is no guarding or rebound.  Musculoskeletal:     Cervical back: Neck supple.  Lymphadenopathy:     Cervical: No cervical adenopathy.  Skin:    Findings: No rash.  Neurological:     Mental Status: He is alert.           Assessment & Plan:  Viral URI Patient appears to have a viral upper respiratory infection.  This has to run its course.  I recommended the use Mucinex  DM for chest congestion and cough.  I am concerned that he is developing bronchospasm.  He has audible wheezing today on exam.  Therefore I did give him a prednisone  taper pack to help with the bronchospasm and the wheezing.  Recommended Tylenol  for fever and bodyaches.  If he develops purulent sputum worsening fever, I did give him a prescription for an antibiotic/Z-Pak for possible bacterial bronchitis.  We discussed antibiotic resistance and I recommended he hold the antibiotic unless his symptoms worsen

## 2023-07-14 ENCOUNTER — Other Ambulatory Visit: Payer: Self-pay | Admitting: Cardiology

## 2023-09-02 NOTE — Progress Notes (Unsigned)
 Cardiology Office Note:   Date:  09/03/2023  ID:  [name], DOB 1941-03-29, MRN [id number] PCP: Donita Brooks, MD  [facility] Providers Cardiologist:  Rollene Rotunda, MD {  History of Present Illness:   [name] is a 83 y.o. male he presented to the ED on April 29 with NSTEMI.  He had three-vessel disease on left heart catheterization.  He underwent CABG x 5 utilizing LIMA to LAD, SVG sequential to OM1, OM2, and OM3, and SVG to OM.  He had post op atrial fib.  He has had elevated BP at home.      He returns for follow-up.  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  He has no chest discomfort with this.  He denies any shortness of breath.  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  He had some chest discomfort in February 13 which was the anniversary 15 years ago of his son dying in an automobile accident.  He burped and that discomfort went away and has not had that since.  He is not having any new palpitations, presyncope or syncope.  He is not having any new shortness of breath, PND or orthopnea.  He has had no weight gain or edema.  He is actually lost weight.  He goes to the gym and walks on a treadmill.  He is trying to diet.  ROS: As stated in the HPI and negative for all other systems.  Studies Reviewed:    EKG:   EKG Interpretation Date/Time:  Thursday September 03 2023 13:50:23 EST Ventricular Rate:  61 PR Interval:  192 QRS Duration:  90 QT Interval:  422 QTC Calculation: 424 R Axis:   -35  Text Interpretation: Normal sinus rhythm Left axis deviation Septal infarct (cited on or before 03-Sep-2023) When compared with ECG of 07-Jan-2023 08:22, No significant change was found Confirmed by Rollene Rotunda ([id number]) on 09/03/2023 2:23:22 PM    Risk Assessment/Calculations:     Physical Exam:   VS:  BP (!) 158/80 (BP Location: Left Arm, Patient Position: Sitting, Cuff Size: Large)   Pulse 61   Ht 5' 9.5" (1.765 m)   Wt 213 lb (96.6 kg)   SpO2 98%    BMI 31.00 kg/m    Wt Readings from Last 3 Encounters:  09/03/23 213 lb (96.6 kg)  07/03/23 220 lb 9.6 oz (100.1 kg)  03/06/23 225 lb 3.2 oz (102.2 kg)     GEN: Well nourished, well developed in no acute distress NECK: No JVD; No carotid bruits CARDIAC: RRR, no murmurs, rubs, gallops RESPIRATORY:  Clear to auscultation without rales, wheezing or rhonchi  ABDOMEN: Soft, non-tender, non-distended EXTREMITIES:  No edema; No deformity   ASSESSMENT AND PLAN:   Coronary artery disease status post CABG:   The patient has no new sypmtoms.  No further cardiovascular testing is indicated.  We will continue with aggressive risk reduction and meds as listed.  Postop atrial fibrillation: He has never had any recurrence of this.  I took him off Eliquis at the last visit.  No change in therapy.  Hypertension: His blood pressure is elevated.  I am going to increase his hydralazine to 50 mg twice daily and he can keep a blood pressure diary.    Hyperlipidemia : LDL was 51 previously but I do not have recent labs.  I will have him come back for fasting lipid profile with goal of LDL in the 50s.  Follow up with me in 1 year  Signed, Rollene Rotunda, MD

## 2023-09-03 ENCOUNTER — Encounter: Payer: Self-pay | Admitting: Cardiology

## 2023-09-03 ENCOUNTER — Ambulatory Visit: Payer: Medicare Other | Attending: Cardiology | Admitting: Cardiology

## 2023-09-03 VITALS — BP 158/80 | HR 61 | Ht 69.5 in | Wt 213.0 lb

## 2023-09-03 DIAGNOSIS — I4891 Unspecified atrial fibrillation: Secondary | ICD-10-CM | POA: Insufficient documentation

## 2023-09-03 DIAGNOSIS — I2510 Atherosclerotic heart disease of native coronary artery without angina pectoris: Secondary | ICD-10-CM | POA: Insufficient documentation

## 2023-09-03 DIAGNOSIS — I9789 Other postprocedural complications and disorders of the circulatory system, not elsewhere classified: Secondary | ICD-10-CM | POA: Diagnosis present

## 2023-09-03 DIAGNOSIS — I10 Essential (primary) hypertension: Secondary | ICD-10-CM | POA: Insufficient documentation

## 2023-09-03 DIAGNOSIS — E785 Hyperlipidemia, unspecified: Secondary | ICD-10-CM | POA: Insufficient documentation

## 2023-09-03 MED ORDER — HYDRALAZINE HCL 50 MG PO TABS: ORAL_TABLET | ORAL | 3 refills | Status: DC

## 2023-09-03 NOTE — Patient Instructions (Signed)
 Medication Instructions:  Increase hydralazine to 50 mg by mouth twice per day. New script sent. *If you need a refill on your cardiac medications before your next appointment, please call your pharmacy*   Lab Work: Fasting lipid profile at your convenience. No appointment needed.  If you have labs (blood work) drawn today and your tests are completely normal, you will receive your results only by: MyChart Message (if you have MyChart) OR A paper copy in the mail If you have any lab test that is abnormal or we need to change your treatment, we will call you to review the results.   Follow-Up: At [facility], you and your health needs are our priority.  As part of our continuing mission to provide you with exceptional heart care, we have created designated Provider Care Teams.  These Care Teams include your primary Cardiologist (physician) and Advanced Practice Providers (APPs -  Physician Assistants and Nurse Practitioners) who all work together to provide you with the care you need, when you need it.  We recommend signing up for the patient portal called "MyChart".  Sign up information is provided on this After Visit Summary.  MyChart is used to connect with patients for Virtual Visits (Telemedicine).  Patients are able to view lab/test results, encounter notes, upcoming appointments, etc.  Non-urgent messages can be sent to your provider as well.   To learn more about what you can do with MyChart, go to [email or web address].    Your next appointment:   1 year(s)  Provider:   Rollene Rotunda, MD     Other Instructions Take and record blood pressure twice per day and record on the BP log provided by the office nurse.

## 2023-09-08 LAB — LIPID PANEL
Chol/HDL Ratio: 2.4 ratio (ref 0.0–5.0)
Cholesterol, Total: 114 mg/dL (ref 100–199)
HDL: 47 mg/dL (ref >39)
LDL Chol Calc (NIH): 48 mg/dL (ref 0–99)
Triglycerides: 103 mg/dL (ref 0–149)
VLDL Cholesterol Cal: 19 mg/dL (ref 5–40)

## 2023-12-25 ENCOUNTER — Telehealth: Payer: Self-pay

## 2023-12-25 ENCOUNTER — Ambulatory Visit: Admitting: Family Medicine

## 2023-12-25 ENCOUNTER — Encounter: Payer: Self-pay | Admitting: Family Medicine

## 2023-12-25 VITALS — BP 139/77 | HR 71 | Temp 98.9°F | Ht 69.5 in | Wt 218.8 lb

## 2023-12-25 DIAGNOSIS — I776 Arteritis, unspecified: Secondary | ICD-10-CM | POA: Diagnosis not present

## 2023-12-25 MED ORDER — PREDNISONE 20 MG PO TABS: ORAL_TABLET | ORAL | 0 refills | Status: DC

## 2023-12-25 NOTE — Progress Notes (Signed)
 Subjective:    Patient ID: [name], male    DOB: 01/06/1941, 83 y.o.   MRN: [id number] Patient was recently bitten by a chigger on the medial aspect of his left shin.  There is a quarter sized erythematous patch in that area.  He is concerned that it was not a tick bite.  He has numerous nonblanchable erythematous macules and papules that appear to be petechia on the anterior surface of both shins.  Each is 1 mm in diameter.  It also is on his chest under both breast.  Both areas itch profusely.  There are no purpura.  He denies any fevers or chills or headaches or neck stiffness.  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, Gleason 7 of left lateral base   HLD (hyperlipidemia)    Hypertension    Past Surgical History:  Procedure Laterality Date   APPENDECTOMY     BRAIN SURGERY     CORONARY ARTERY BYPASS GRAFT N/A 10/31/2022   Procedure: CORONARY ARTERY BYPASS GRAFTING (CABG) X 5 USING LEFT INTERNAL MAMMARY ARTERY AND ENDOSCOPICALLY HARVESTED RIGHT GREATER SAPHENOUS VEIN;  Surgeon: Kerrin Elspeth BROCKS, MD;  Location: [facility] OR;  Service: Open Heart Surgery;  Laterality: N/A;   LEFT HEART CATH AND CORONARY ANGIOGRAPHY N/A 10/29/2022   Procedure: LEFT HEART CATH AND CORONARY ANGIOGRAPHY;  Surgeon: Anner Alm ORN, MD;  Location: [facility] INVASIVE CV LAB;  Service: Cardiovascular;  Laterality: N/A;   TEE WITHOUT CARDIOVERSION N/A 10/31/2022   Procedure: TRANSESOPHAGEAL ECHOCARDIOGRAM;  Surgeon: Kerrin Elspeth BROCKS, MD;  Location: [facility] OR;  Service: Open Heart Surgery;  Laterality: N/A;   Current Outpatient Medications on File Prior to Visit  Medication Sig Dispense Refill   aspirin  EC 81 MG tablet Take 81 mg by mouth at bedtime.     hydrALAZINE  (APRESOLINE ) 50 MG tablet Take 1 tablet by mouth twice daily 90 tablet 3   rosuvastatin  (CRESTOR ) 40 MG  tablet Take 1 tablet by mouth once daily 30 tablet 5   azithromycin  (ZITHROMAX ) 250 MG tablet 2 tabs poqday1, 1 tab poqday 2-5 (Patient not taking: Reported on 12/25/2023) 6 tablet 0   predniSONE  (DELTASONE ) 20 MG tablet 3 tabs poqday 1-2, 2 tabs poqday 3-4, 1 tab poqday 5-6 (Patient not taking: Reported on 12/25/2023) 12 tablet 0   No current facility-administered medications on file prior to visit.   Allergies  Allergen Reactions   Norvasc [Amlodipine] Swelling    Leg swelling   Red Dye #40 (Allura Red) Hives   Social History   Socioeconomic History   Marital status: Married    Spouse name: Not on file   Number of children: Not on file   Years of education: Not on file   Highest education level: Not on file  Occupational History   Not on file  Tobacco Use   Smoking status: Former   Smokeless tobacco: Never  Vaping Use   Vaping status: Never Used  Substance and Sexual Activity   Alcohol use: Never   Drug use: Never   Sexual activity: Not Currently    Partners: Female    Comment: DIVORCED  Other Topics Concern   Not on file  Social History Narrative   Not on file   Social Drivers of Health   Financial Resource Strain: Not on file  Food  Insecurity: No Food Insecurity (11/05/2022)   Hunger Vital Sign    Worried About Running Out of Food in the Last Year: Never true    Ran Out of Food in the Last Year: Never true  Transportation Needs: No Transportation Needs (11/05/2022)   PRAPARE - Administrator, Civil Service (Medical): No    Lack of Transportation (Non-Medical): No  Physical Activity: Not on file  Stress: Not on file  Social Connections: Not on file  Intimate Partner Violence: Not At Risk (11/05/2022)   Humiliation, Afraid, Rape, and Kick questionnaire    Fear of Current or Ex-Partner: No    Emotionally Abused: No    Physically Abused: No    Sexually Abused: No      Review of Systems  All other systems reviewed and are negative.      Objective:    Physical Exam Constitutional:      General: He is not in acute distress.    Appearance: Normal appearance. He is normal weight. He is not ill-appearing or toxic-appearing.  HENT:     Mouth/Throat:     Mouth: Mucous membranes are moist.     Pharynx: No oropharyngeal exudate or posterior oropharyngeal erythema.   Eyes:     Conjunctiva/sclera: Conjunctivae normal.    Cardiovascular:     Rate and Rhythm: Normal rate and regular rhythm.     Heart sounds: Normal heart sounds. No murmur heard.    No friction rub. No gallop.  Pulmonary:     Effort: Pulmonary effort is normal. No respiratory distress.     Breath sounds: Decreased air movement present. No stridor. No wheezing, rhonchi or rales.  Abdominal:     General: Bowel sounds are normal. There is no distension.     Palpations: Abdomen is soft.     Tenderness: There is no abdominal tenderness. There is no guarding or rebound.   Musculoskeletal:     Cervical back: Neck supple.  Lymphadenopathy:     Cervical: No cervical adenopathy.   Skin:    Findings: Petechiae and rash present. Rash is macular and papular.       Neurological:     Mental Status: He is alert.           Assessment & Plan:  Vasculitis (HCC) - Plan: predniSONE  (DELTASONE ) 20 MG tablet, B. 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  Obviously I am concerned about tickborne illnesses.  Check Lyme titers as well as [facility] spotted fever titers.  Check CBC CMP ANA and ANCA along with sed rate.  Meanwhile treat allergic reaction with prednisone  for possible cutaneous vasculitis.  Await the results of lab work.  If he develops fever or headache or neck stiffness, I would start the patient immediately on doxycycline .

## 2023-12-25 NOTE — Addendum Note (Signed)
 Addended by: CORINNA JESUSA SAUNDERS on: 12/25/2023 04:09 PM   Modules accepted: Orders

## 2023-12-27 ENCOUNTER — Ambulatory Visit: Payer: Self-pay | Admitting: Family Medicine

## 2023-12-28 NOTE — Telephone Encounter (Signed)
 Please see previous encounter. This made in error.

## 2023-12-30 LAB — CBC WITH DIFFERENTIAL/PLATELET
Absolute Lymphocytes: 1448 {cells}/uL (ref 850–3900)
Absolute Monocytes: 592 {cells}/uL (ref 200–950)
Basophils Absolute: 48 {cells}/uL (ref 0–200)
Basophils Relative: 0.7 %
Eosinophils Absolute: 82 {cells}/uL (ref 15–500)
Eosinophils Relative: 1.2 %
HCT: 40.3 % (ref 38.5–50.0)
Hemoglobin: 13.3 g/dL (ref 13.2–17.1)
MCH: 32.5 pg (ref 27.0–33.0)
MCHC: 33 g/dL (ref 32.0–36.0)
MCV: 98.5 fL (ref 80.0–100.0)
MPV: 10.5 fL (ref 7.5–12.5)
Monocytes Relative: 8.7 %
Neutro Abs: 4631 {cells}/uL (ref 1500–7800)
Neutrophils Relative %: 68.1 %
Platelets: 199 10*3/uL (ref 140–400)
RBC: 4.09 10*6/uL — ABNORMAL LOW (ref 4.20–5.80)
RDW: 13.4 % (ref 11.0–15.0)
Total Lymphocyte: 21.3 %
WBC: 6.8 10*3/uL (ref 3.8–10.8)

## 2023-12-30 LAB — COMPREHENSIVE METABOLIC PANEL WITH GFR
AG Ratio: 1.8 (calc) (ref 1.0–2.5)
ALT: 26 U/L (ref 9–46)
AST: 29 U/L (ref 10–35)
Albumin: 4.4 g/dL (ref 3.6–5.1)
Alkaline phosphatase (APISO): 69 U/L (ref 35–144)
BUN/Creatinine Ratio: 18 (calc) (ref 6–22)
BUN: 23 mg/dL (ref 7–25)
CO2: 28 mmol/L (ref 20–32)
Calcium: 9.1 mg/dL (ref 8.6–10.3)
Chloride: 103 mmol/L (ref 98–110)
Creat: 1.3 mg/dL — ABNORMAL HIGH (ref 0.70–1.22)
Globulin: 2.4 g/dL (ref 1.9–3.7)
Glucose, Bld: 116 mg/dL — ABNORMAL HIGH (ref 65–99)
Potassium: 4.9 mmol/L (ref 3.5–5.3)
Sodium: 139 mmol/L (ref 135–146)
Total Bilirubin: 0.7 mg/dL (ref 0.2–1.2)
Total Protein: 6.8 g/dL (ref 6.1–8.1)
eGFR: 55 mL/min/{1.73_m2} — ABNORMAL LOW (ref >=60)

## 2023-12-30 LAB — B. BURGDORFI ANTIBODIES BY WB

## 2023-12-30 LAB — ANA: Anti Nuclear Antibody (ANA): NEGATIVE

## 2023-12-30 LAB — ROCKY MTN SPOTTED FVR ABS PNL(IGG+IGM)
RMSF IgG: NOT DETECTED
RMSF IgM: NOT DETECTED

## 2023-12-30 LAB — SEDIMENTATION RATE: Sed Rate: 6 mm/h (ref 0–20)

## 2024-01-20 ENCOUNTER — Other Ambulatory Visit: Payer: Self-pay | Admitting: Cardiology

## 2024-04-11 ENCOUNTER — Other Ambulatory Visit: Payer: Self-pay | Admitting: Cardiology

## 2024-04-11 ENCOUNTER — Telehealth: Payer: Self-pay | Admitting: Cardiology

## 2024-04-11 MED ORDER — HYDRALAZINE HCL 50 MG PO TABS: ORAL_TABLET | ORAL | 1 refills | Status: AC

## 2024-04-11 NOTE — Telephone Encounter (Signed)
 Pt's medication was sent to pt's pharmacy as requested. Confirmation received.

## 2024-04-11 NOTE — Telephone Encounter (Signed)
*  STAT* If patient is at the pharmacy, call can be transferred to refill team.   1. Which medications need to be refilled? (please list name of each medication and dose if known) hydrALAZINE  (APRESOLINE ) 50 MG tablet   Take 1 tablet by mouth twice daily    2. Would you like to learn more about the convenience, safety, & potential cost savings by using the [facility]? No    3. Are you open to using the [facility] No   4. Which pharmacy/location (including street and city if local pharmacy) is medication to be sent to? [id number] [place], [place] - [address]    5. Do they need a 30 day or 90 day supply? 90 Day Supply  Pt is currently out of medication

## 2024-06-02 ENCOUNTER — Ambulatory Visit: Admitting: Family Medicine

## 2024-06-02 ENCOUNTER — Encounter: Payer: Self-pay | Admitting: Family Medicine

## 2024-06-02 VITALS — BP 168/82 | HR 64 | Temp 97.8°F | Ht 69.0 in | Wt 226.4 lb

## 2024-06-02 DIAGNOSIS — S76012A Strain of muscle, fascia and tendon of left hip, initial encounter: Secondary | ICD-10-CM

## 2024-06-02 MED ORDER — PREDNISONE 20 MG PO TABS: ORAL_TABLET | ORAL | 0 refills | Status: AC

## 2024-06-02 MED ORDER — LOSARTAN POTASSIUM 50 MG PO TABS: 50.0000 mg | ORAL_TABLET | Freq: Every day | ORAL | 3 refills | Status: AC

## 2024-06-02 NOTE — Progress Notes (Signed)
 Subjective:    Patient ID: [name], male    DOB: 1940/07/12, 83 y.o.   MRN: [id number]  3 weeks ago, the patient was digging a ditch on his property.  He was on his right knee.  His left leg he had a hip flexed and the knee flexed with his foot on the ground.  He put all his weight pressing down on his left knee to try to stand up from that position.  He felt a popping sensation on the lateral aspect of his left hip.  For the last 3 weeks he is having pain in his left hip with walking.  The pain is located in the iliotibial band.  It hurts to stand.  It hurts to walk.  The pain radiates from the left lateral hip down to the left lateral knee.  Today he has no pain with flexion and extension of the hip or internal or external rotation.  He has pain near the greater trochanter and behind and inferior to the greater trochanter with standing.  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, Gleason 7 of left lateral base   HLD (hyperlipidemia)    Hypertension    Past Surgical History:  Procedure Laterality Date   APPENDECTOMY     BRAIN SURGERY     CORONARY ARTERY BYPASS GRAFT N/A 10/31/2022   Procedure: CORONARY ARTERY BYPASS GRAFTING (CABG) X 5 USING LEFT INTERNAL MAMMARY ARTERY AND ENDOSCOPICALLY HARVESTED RIGHT GREATER SAPHENOUS VEIN;  Surgeon: Kerrin Elspeth BROCKS, MD;  Location: [facility] OR;  Service: Open Heart Surgery;  Laterality: N/A;   LEFT HEART CATH AND CORONARY ANGIOGRAPHY N/A 10/29/2022   Procedure: LEFT HEART CATH AND CORONARY ANGIOGRAPHY;  Surgeon: Anner Alm ORN, MD;  Location: [facility] INVASIVE CV LAB;  Service: Cardiovascular;  Laterality: N/A;   TEE WITHOUT CARDIOVERSION N/A 10/31/2022   Procedure: TRANSESOPHAGEAL ECHOCARDIOGRAM;  Surgeon: Kerrin Elspeth BROCKS, MD;  Location: [facility] OR;  Service: Open Heart Surgery;  Laterality: N/A;   Current Outpatient Medications on File Prior  to Visit  Medication Sig Dispense Refill   aspirin  EC 81 MG tablet Take 81 mg by mouth at bedtime.     hydrALAZINE  (APRESOLINE ) 50 MG tablet Take 1 tablet by mouth twice daily 180 tablet 1   rosuvastatin  (CRESTOR ) 40 MG tablet Take 1 tablet by mouth once daily 90 tablet 2   No current facility-administered medications on file prior to visit.     Allergies  Allergen Reactions   Norvasc [Amlodipine] Swelling    Leg swelling   Red Dye #40 (Allura Red) Hives   Social History   Socioeconomic History   Marital status: Married    Spouse name: Not on file   Number of children: Not on file   Years of education: Not on file   Highest education level: Not on file  Occupational History   Not on file  Tobacco Use   Smoking status: Former   Smokeless tobacco: Never  Vaping Use   Vaping status: Never Used  Substance and Sexual Activity   Alcohol use: Never   Drug use: Never   Sexual activity: Not Currently    Partners: Female    Comment: DIVORCED  Other Topics Concern   Not on file  Social History Narrative   Not on file   Social Drivers of Health   Financial Resource Strain:  Not on file  Food Insecurity: No Food Insecurity (11/05/2022)   Hunger Vital Sign    Worried About Running Out of Food in the Last Year: Never true    Ran Out of Food in the Last Year: Never true  Transportation Needs: No Transportation Needs (11/05/2022)   PRAPARE - Administrator, Civil Service (Medical): No    Lack of Transportation (Non-Medical): No  Physical Activity: Not on file  Stress: Not on file  Social Connections: Not on file  Intimate Partner Violence: Not At Risk (11/05/2022)   Humiliation, Afraid, Rape, and Kick questionnaire    Fear of Current or Ex-Partner: No    Emotionally Abused: No    Physically Abused: No    Sexually Abused: No      Review of Systems  All other systems reviewed and are negative.      Objective:   Physical Exam Constitutional:      General: He  is not in acute distress.    Appearance: Normal appearance. He is normal weight. He is not ill-appearing or toxic-appearing.  HENT:     Mouth/Throat:     Mouth: Mucous membranes are moist.     Pharynx: No oropharyngeal exudate or posterior oropharyngeal erythema.  Eyes:     Conjunctiva/sclera: Conjunctivae normal.  Cardiovascular:     Rate and Rhythm: Normal rate and regular rhythm.     Heart sounds: Normal heart sounds. No murmur heard.    No friction rub. No gallop.  Pulmonary:     Effort: Pulmonary effort is normal. No respiratory distress.     Breath sounds: No stridor, decreased air movement or transmitted upper airway sounds. No wheezing, rhonchi or rales.  Abdominal:     General: Bowel sounds are normal. There is no distension.     Palpations: Abdomen is soft.     Tenderness: There is no abdominal tenderness. There is no guarding or rebound.  Musculoskeletal:     Cervical back: Neck supple.     Left hip: Tenderness present. No bony tenderness. Decreased range of motion. Decreased strength.       Legs:  Lymphadenopathy:     Cervical: No cervical adenopathy.  Neurological:     Mental Status: He is alert.           Assessment & Plan:  Strain of left hip, initial encounter Blood pressure is extremely high today.  Add losartan  50 mg a day and recheck blood pressure in 1 to 2 weeks.  I believe the patient is straining the iliotibial band or part of the gluteus muscle when he tried to extend his hip against his full body weight.  Patient wants to avoid NSAIDs due to his coronary artery disease so we will try a short taper of prednisone .  Recheck if no better in 1 to 2 weeks

## 2024-07-11 ENCOUNTER — Ambulatory Visit: Admitting: Family Medicine

## 2024-07-11 ENCOUNTER — Encounter: Payer: Self-pay | Admitting: Family Medicine

## 2024-07-11 VITALS — BP 132/64 | HR 73 | Temp 98.0°F | Ht 69.0 in | Wt 225.8 lb

## 2024-07-11 DIAGNOSIS — M25551 Pain in right hip: Secondary | ICD-10-CM

## 2024-07-11 DIAGNOSIS — M79605 Pain in left leg: Secondary | ICD-10-CM | POA: Diagnosis not present

## 2024-07-11 MED ORDER — PREDNISONE 20 MG PO TABS: ORAL_TABLET | ORAL | 0 refills | Status: AC

## 2024-07-11 NOTE — Progress Notes (Signed)
 "  Subjective:    Patient ID: [name], male    DOB: 09-04-1940, 84 y.o.   MRN: [id number]  3 weeks ago, the patient was digging a ditch on his property.  He was on his right knee.  His left leg he had a hip flexed and the knee flexed with his foot on the ground.  He put all his weight pressing down on his left knee to try to stand up from that position.  He felt a popping sensation on the lateral aspect of his left hip.  For the last 3 weeks he is having pain in his left hip with walking.  The pain is located in the iliotibial band.  It hurts to stand.  It hurts to walk.  The pain radiates from the left lateral hip down to the left lateral knee.  Today he has no pain with flexion and extension of the hip or internal or external rotation.  He has pain near the greater trochanter and behind and inferior to the greater trochanter with standing.  Blood pressure is extremely high.  At that time, my plan was:  Blood pressure is extremely high today.  Add losartan  50 mg a day and recheck blood pressure in 1 to 2 weeks.  I believe the patient is straining the iliotibial band or part of the gluteus muscle when he tried to extend his hip against his full body weight.  Patient wants to avoid NSAIDs due to his coronary artery disease so we will try a short taper of prednisone .  Recheck if no better in 1 to 2 weeks  07/11/24 Patient states that the pain in his left lateral thigh has improved and gone away.  Seem to really improve while he was on prednisone .  However he is now having aching and throbbing pain in his distal left leg below the knee directly over the anterior tibialis and the tibial shaft.  There is no pain with resisted dorsiflexion of the ankle.  There is no pain with knee extension and flexion.  There is no pain with range of motion in the knee or Apley grind.  There is no tenderness to palpation or erythema or swelling in the left leg.  I believe the patient's pain in his left leg is likely now  referred pain.  The patient may be dealing with left-sided lumbar radiculopathy.  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, Gleason 7 of left lateral base   HLD (hyperlipidemia)    Hypertension    Past Surgical History:  Procedure Laterality Date   APPENDECTOMY     BRAIN SURGERY     CORONARY ARTERY BYPASS GRAFT N/A 10/31/2022   Procedure: CORONARY ARTERY BYPASS GRAFTING (CABG) X 5 USING LEFT INTERNAL MAMMARY ARTERY AND ENDOSCOPICALLY HARVESTED RIGHT GREATER SAPHENOUS VEIN;  Surgeon: Kerrin Elspeth BROCKS, MD;  Location: [facility] OR;  Service: Open Heart Surgery;  Laterality: N/A;   LEFT HEART CATH AND CORONARY ANGIOGRAPHY N/A 10/29/2022   Procedure: LEFT HEART CATH AND CORONARY ANGIOGRAPHY;  Surgeon: Anner Alm ORN, MD;  Location: [facility] INVASIVE CV LAB;  Service: Cardiovascular;  Laterality: N/A;   TEE WITHOUT CARDIOVERSION N/A 10/31/2022   Procedure: TRANSESOPHAGEAL ECHOCARDIOGRAM;  Surgeon: Kerrin Elspeth BROCKS, MD;  Location: [facility] OR;  Service: Open Heart Surgery;  Laterality: N/A;   Current Outpatient Medications on  File Prior to Visit  Medication Sig Dispense Refill   aspirin  EC 81 MG tablet Take 81 mg by mouth at bedtime.     hydrALAZINE  (APRESOLINE ) 50 MG tablet Take 1 tablet by mouth twice daily 180 tablet 1   losartan  (COZAAR ) 50 MG tablet Take 1 tablet (50 mg total) by mouth daily. 90 tablet 3   rosuvastatin  (CRESTOR ) 40 MG tablet Take 1 tablet by mouth once daily 90 tablet 2   predniSONE  (DELTASONE ) 20 MG tablet 3 tabs poqday 1-2, 2 tabs poqday 3-4, 1 tab poqday 5-6 (Patient not taking: Reported on 07/11/2024) 12 tablet 0   No current facility-administered medications on file prior to visit.     Allergies  Allergen Reactions   Norvasc [Amlodipine] Swelling    Leg swelling   Red Dye #40 (Allura Red) Hives   Social History   Socioeconomic History   Marital  status: Married    Spouse name: Not on file   Number of children: Not on file   Years of education: Not on file   Highest education level: Not on file  Occupational History   Not on file  Tobacco Use   Smoking status: Former   Smokeless tobacco: Never  Vaping Use   Vaping status: Never Used  Substance and Sexual Activity   Alcohol use: Never   Drug use: Never   Sexual activity: Not Currently    Partners: Female    Comment: DIVORCED  Other Topics Concern   Not on file  Social History Narrative   Not on file   Social Drivers of Health   Tobacco Use: Medium Risk (07/11/2024)   Patient History    Smoking Tobacco Use: Former    Smokeless Tobacco Use: Never    Passive Exposure: Not on Actuary Strain: Not on file  Food Insecurity: No Food Insecurity (11/05/2022)   Hunger Vital Sign    Worried About Running Out of Food in the Last Year: Never true    Ran Out of Food in the Last Year: Never true  Transportation Needs: No Transportation Needs (11/05/2022)   PRAPARE - Administrator, Civil Service (Medical): No    Lack of Transportation (Non-Medical): No  Physical Activity: Not on file  Stress: Not on file  Social Connections: Not on file  Intimate Partner Violence: Not At Risk (11/05/2022)   Humiliation, Afraid, Rape, and Kick questionnaire    Fear of Current or Ex-Partner: No    Emotionally Abused: No    Physically Abused: No    Sexually Abused: No  Depression (PHQ2-9): Low Risk (07/03/2023)   Depression (PHQ2-9)    PHQ-2 Score: 0  Alcohol Screen: Not on file  Housing: Low Risk (10/31/2022)   Housing    Last Housing Risk Score: 0  Utilities: Not At Risk (11/05/2022)   [facility] Utilities    Threatened with loss of utilities: No  Health Literacy: Not on file      Review of Systems  All other systems reviewed and are negative.      Objective:   Physical Exam Constitutional:      General: He is not in acute distress.    Appearance: Normal  appearance. He is normal weight. He is not ill-appearing or toxic-appearing.  HENT:     Mouth/Throat:     Mouth: Mucous membranes are moist.     Pharynx: No oropharyngeal exudate or posterior oropharyngeal erythema.  Eyes:     Conjunctiva/sclera: Conjunctivae normal.  Cardiovascular:  Rate and Rhythm: Normal rate and regular rhythm.     Heart sounds: Normal heart sounds. No murmur heard.    No friction rub. No gallop.  Pulmonary:     Effort: Pulmonary effort is normal. No respiratory distress.     Breath sounds: No stridor, decreased air movement or transmitted upper airway sounds. No wheezing, rhonchi or rales.  Abdominal:     General: Bowel sounds are normal. There is no distension.     Palpations: Abdomen is soft.     Tenderness: There is no abdominal tenderness. There is no guarding or rebound.  Musculoskeletal:     Cervical back: Neck supple.     Right hip: Decreased range of motion.     Left hip: No tenderness or bony tenderness. Decreased range of motion. Normal strength.     Left lower leg: Normal. No swelling or deformity. No edema.       Legs:  Lymphadenopathy:     Cervical: No cervical adenopathy.  Neurological:     Mental Status: He is alert.           Assessment & Plan:  Right hip pain - Plan: DG Hip Unilat W OR W/O Pelvis 2-3 Views Right  Left leg pain I believe the patient's left leg pain is likely referred pain from lumbar radiculopathy.  Try prednisone  taper 1 additional time.  If pain improves but returns I would recommend an MRI of the lumbar spine to evaluate for a source of nerve impingement.  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  Recommended x-rays of the right hip "
